# Patient Record
Sex: Female | Born: 1945
Health system: Southern US, Community
[De-identification: ages and names within clinical notes are randomized; demographics above are authoritative.]

## PROBLEM LIST (undated history)

## (undated) DIAGNOSIS — N879 Dysplasia of cervix uteri, unspecified: Secondary | ICD-10-CM

## (undated) DIAGNOSIS — J449 Chronic obstructive pulmonary disease, unspecified: Secondary | ICD-10-CM

## (undated) DIAGNOSIS — F419 Anxiety disorder, unspecified: Secondary | ICD-10-CM

## (undated) DIAGNOSIS — F329 Major depressive disorder, single episode, unspecified: Secondary | ICD-10-CM

## (undated) DIAGNOSIS — J340 Abscess, furuncle and carbuncle of nose: Secondary | ICD-10-CM

## (undated) DIAGNOSIS — F32A Depression, unspecified: Secondary | ICD-10-CM

## (undated) DIAGNOSIS — C44722 Squamous cell carcinoma of skin of right lower limb, including hip: Secondary | ICD-10-CM

## (undated) DIAGNOSIS — C801 Malignant (primary) neoplasm, unspecified: Secondary | ICD-10-CM

## (undated) DIAGNOSIS — J45909 Unspecified asthma, uncomplicated: Secondary | ICD-10-CM

## (undated) DIAGNOSIS — T7840XA Allergy, unspecified, initial encounter: Secondary | ICD-10-CM

## (undated) DIAGNOSIS — M810 Age-related osteoporosis without current pathological fracture: Secondary | ICD-10-CM

## (undated) DIAGNOSIS — C44719 Basal cell carcinoma of skin of left lower limb, including hip: Secondary | ICD-10-CM

## (undated) DIAGNOSIS — K219 Gastro-esophageal reflux disease without esophagitis: Secondary | ICD-10-CM

## (undated) DIAGNOSIS — E7800 Pure hypercholesterolemia, unspecified: Secondary | ICD-10-CM

## (undated) DIAGNOSIS — K52832 Lymphocytic colitis: Secondary | ICD-10-CM

## (undated) DIAGNOSIS — I10 Essential (primary) hypertension: Secondary | ICD-10-CM

## (undated) DIAGNOSIS — H409 Unspecified glaucoma: Secondary | ICD-10-CM

## (undated) HISTORY — DX: Malignant (primary) neoplasm, unspecified: C80.1

## (undated) HISTORY — DX: Allergy, unspecified, initial encounter: T78.40XA

## (undated) HISTORY — DX: Gastro-esophageal reflux disease without esophagitis: K21.9

## (undated) HISTORY — DX: Unspecified glaucoma: H40.9

## (undated) HISTORY — PX: COLPOSCOPY: SHX161

## (undated) HISTORY — DX: Depression, unspecified: F32.A

## (undated) HISTORY — DX: Chronic obstructive pulmonary disease, unspecified: J44.9

## (undated) HISTORY — DX: Basal cell carcinoma of skin of left lower limb, including hip: C44.719

## (undated) HISTORY — DX: Unspecified asthma, uncomplicated: J45.909

## (undated) HISTORY — PX: COSMETIC SURGERY: SHX468

## (undated) HISTORY — PX: OOPHORECTOMY: SHX86

## (undated) HISTORY — DX: Essential (primary) hypertension: I10

## (undated) HISTORY — DX: Anxiety disorder, unspecified: F41.9

## (undated) HISTORY — PX: TONSILLECTOMY: SUR1361

## (undated) HISTORY — DX: Dysplasia of cervix uteri, unspecified: N87.9

## (undated) HISTORY — DX: Major depressive disorder, single episode, unspecified: F32.9

## (undated) HISTORY — DX: Lymphocytic colitis: K52.832

## (undated) HISTORY — DX: Squamous cell carcinoma of skin of right lower limb, including hip: C44.722

## (undated) HISTORY — DX: Pure hypercholesterolemia, unspecified: E78.00

## (undated) HISTORY — PX: AUGMENTATION MAMMAPLASTY: SUR837

## (undated) HISTORY — DX: Age-related osteoporosis without current pathological fracture: M81.0

---

## 1987-10-14 HISTORY — PX: ABDOMINAL HYSTERECTOMY: SHX81

## 1998-08-20 ENCOUNTER — Ambulatory Visit (HOSPITAL_COMMUNITY): Admission: RE | Admit: 1998-08-20 | Discharge: 1998-08-20 | Payer: Self-pay | Admitting: Gynecology

## 1998-08-20 ENCOUNTER — Encounter: Payer: Self-pay | Admitting: Gynecology

## 1999-07-08 ENCOUNTER — Ambulatory Visit (HOSPITAL_COMMUNITY): Admission: RE | Admit: 1999-07-08 | Discharge: 1999-07-08 | Payer: Self-pay | Admitting: *Deleted

## 1999-08-14 ENCOUNTER — Other Ambulatory Visit: Admission: RE | Admit: 1999-08-14 | Discharge: 1999-08-14 | Payer: Self-pay | Admitting: Gynecology

## 1999-10-17 ENCOUNTER — Ambulatory Visit (HOSPITAL_COMMUNITY): Admission: RE | Admit: 1999-10-17 | Discharge: 1999-10-17 | Payer: Self-pay | Admitting: Gynecology

## 1999-10-17 ENCOUNTER — Encounter: Payer: Self-pay | Admitting: Gynecology

## 2000-10-01 ENCOUNTER — Emergency Department (HOSPITAL_COMMUNITY): Admission: EM | Admit: 2000-10-01 | Discharge: 2000-10-01 | Payer: Self-pay | Admitting: *Deleted

## 2000-10-02 ENCOUNTER — Encounter: Payer: Self-pay | Admitting: Emergency Medicine

## 2000-10-13 HISTORY — PX: OTHER SURGICAL HISTORY: SHX169

## 2000-11-09 ENCOUNTER — Other Ambulatory Visit: Admission: RE | Admit: 2000-11-09 | Discharge: 2000-11-09 | Payer: Self-pay | Admitting: Gynecology

## 2001-04-13 ENCOUNTER — Emergency Department (HOSPITAL_COMMUNITY): Admission: EM | Admit: 2001-04-13 | Discharge: 2001-04-14 | Payer: Self-pay | Admitting: Emergency Medicine

## 2001-04-14 ENCOUNTER — Encounter (INDEPENDENT_AMBULATORY_CARE_PROVIDER_SITE_OTHER): Payer: Self-pay | Admitting: *Deleted

## 2001-04-14 ENCOUNTER — Encounter: Payer: Self-pay | Admitting: Emergency Medicine

## 2001-04-23 ENCOUNTER — Encounter (INDEPENDENT_AMBULATORY_CARE_PROVIDER_SITE_OTHER): Payer: Self-pay | Admitting: *Deleted

## 2001-04-23 ENCOUNTER — Ambulatory Visit (HOSPITAL_COMMUNITY): Admission: RE | Admit: 2001-04-23 | Discharge: 2001-04-23 | Payer: Self-pay | Admitting: General Surgery

## 2001-04-23 ENCOUNTER — Encounter (HOSPITAL_BASED_OUTPATIENT_CLINIC_OR_DEPARTMENT_OTHER): Payer: Self-pay | Admitting: General Surgery

## 2001-05-18 ENCOUNTER — Encounter (INDEPENDENT_AMBULATORY_CARE_PROVIDER_SITE_OTHER): Payer: Self-pay

## 2001-05-18 ENCOUNTER — Inpatient Hospital Stay (HOSPITAL_COMMUNITY): Admission: RE | Admit: 2001-05-18 | Discharge: 2001-05-22 | Payer: Self-pay | Admitting: Urology

## 2001-08-10 ENCOUNTER — Encounter: Payer: Self-pay | Admitting: Gynecology

## 2001-08-10 ENCOUNTER — Ambulatory Visit (HOSPITAL_COMMUNITY): Admission: RE | Admit: 2001-08-10 | Discharge: 2001-08-10 | Payer: Self-pay | Admitting: Gynecology

## 2001-11-16 ENCOUNTER — Other Ambulatory Visit: Admission: RE | Admit: 2001-11-16 | Discharge: 2001-11-16 | Payer: Self-pay | Admitting: Gynecology

## 2002-05-20 ENCOUNTER — Encounter: Payer: Self-pay | Admitting: Urology

## 2002-05-20 ENCOUNTER — Encounter: Admission: RE | Admit: 2002-05-20 | Discharge: 2002-05-20 | Payer: Self-pay | Admitting: Urology

## 2002-05-24 ENCOUNTER — Emergency Department (HOSPITAL_COMMUNITY): Admission: EM | Admit: 2002-05-24 | Discharge: 2002-05-25 | Payer: Self-pay | Admitting: Emergency Medicine

## 2002-05-24 ENCOUNTER — Encounter: Payer: Self-pay | Admitting: *Deleted

## 2002-07-22 ENCOUNTER — Ambulatory Visit (HOSPITAL_COMMUNITY): Admission: RE | Admit: 2002-07-22 | Discharge: 2002-07-22 | Payer: Self-pay | Admitting: Internal Medicine

## 2002-08-11 ENCOUNTER — Encounter (INDEPENDENT_AMBULATORY_CARE_PROVIDER_SITE_OTHER): Payer: Self-pay | Admitting: Specialist

## 2002-08-11 ENCOUNTER — Encounter (INDEPENDENT_AMBULATORY_CARE_PROVIDER_SITE_OTHER): Payer: Self-pay | Admitting: *Deleted

## 2002-08-11 ENCOUNTER — Ambulatory Visit (HOSPITAL_COMMUNITY): Admission: RE | Admit: 2002-08-11 | Discharge: 2002-08-11 | Payer: Self-pay | Admitting: Gastroenterology

## 2003-01-31 ENCOUNTER — Emergency Department (HOSPITAL_COMMUNITY): Admission: EM | Admit: 2003-01-31 | Discharge: 2003-01-31 | Payer: Self-pay | Admitting: Emergency Medicine

## 2003-01-31 ENCOUNTER — Encounter: Payer: Self-pay | Admitting: Emergency Medicine

## 2004-12-13 ENCOUNTER — Emergency Department (HOSPITAL_COMMUNITY): Admission: EM | Admit: 2004-12-13 | Discharge: 2004-12-13 | Payer: Self-pay | Admitting: Family Medicine

## 2005-04-21 ENCOUNTER — Ambulatory Visit (HOSPITAL_COMMUNITY): Admission: RE | Admit: 2005-04-21 | Discharge: 2005-04-21 | Payer: Self-pay | Admitting: Urology

## 2005-05-01 ENCOUNTER — Ambulatory Visit (HOSPITAL_COMMUNITY): Admission: RE | Admit: 2005-05-01 | Discharge: 2005-05-01 | Payer: Self-pay | Admitting: Gynecology

## 2005-07-10 ENCOUNTER — Other Ambulatory Visit: Admission: RE | Admit: 2005-07-10 | Discharge: 2005-07-10 | Payer: Self-pay | Admitting: Obstetrics and Gynecology

## 2005-10-24 ENCOUNTER — Ambulatory Visit (HOSPITAL_COMMUNITY): Admission: RE | Admit: 2005-10-24 | Discharge: 2005-10-24 | Payer: Self-pay | Admitting: Internal Medicine

## 2006-02-09 ENCOUNTER — Emergency Department (HOSPITAL_COMMUNITY): Admission: EM | Admit: 2006-02-09 | Discharge: 2006-02-09 | Payer: Self-pay | Admitting: Emergency Medicine

## 2006-06-17 ENCOUNTER — Ambulatory Visit (HOSPITAL_COMMUNITY): Admission: RE | Admit: 2006-06-17 | Discharge: 2006-06-17 | Payer: Self-pay | Admitting: Internal Medicine

## 2006-06-19 ENCOUNTER — Ambulatory Visit: Admission: RE | Admit: 2006-06-19 | Discharge: 2006-06-19 | Payer: Self-pay | Admitting: Internal Medicine

## 2006-07-01 ENCOUNTER — Ambulatory Visit: Payer: Self-pay | Admitting: Internal Medicine

## 2006-07-06 ENCOUNTER — Ambulatory Visit: Payer: Self-pay | Admitting: Cardiology

## 2006-07-14 ENCOUNTER — Ambulatory Visit: Payer: Self-pay | Admitting: Internal Medicine

## 2006-08-21 ENCOUNTER — Ambulatory Visit: Payer: Self-pay | Admitting: Internal Medicine

## 2006-08-28 ENCOUNTER — Ambulatory Visit: Payer: Self-pay | Admitting: Internal Medicine

## 2006-09-28 ENCOUNTER — Ambulatory Visit: Payer: Self-pay | Admitting: Internal Medicine

## 2006-10-21 ENCOUNTER — Other Ambulatory Visit: Admission: RE | Admit: 2006-10-21 | Discharge: 2006-10-21 | Payer: Self-pay | Admitting: Obstetrics and Gynecology

## 2006-10-27 ENCOUNTER — Ambulatory Visit: Payer: Self-pay | Admitting: Internal Medicine

## 2006-10-28 ENCOUNTER — Ambulatory Visit: Payer: Self-pay | Admitting: Cardiology

## 2006-10-30 ENCOUNTER — Ambulatory Visit (HOSPITAL_COMMUNITY): Admission: RE | Admit: 2006-10-30 | Discharge: 2006-10-30 | Payer: Self-pay | Admitting: Internal Medicine

## 2006-11-17 ENCOUNTER — Ambulatory Visit: Payer: Self-pay | Admitting: Internal Medicine

## 2006-12-17 ENCOUNTER — Ambulatory Visit: Payer: Self-pay | Admitting: Internal Medicine

## 2007-02-02 ENCOUNTER — Ambulatory Visit (HOSPITAL_COMMUNITY): Admission: RE | Admit: 2007-02-02 | Discharge: 2007-02-02 | Payer: Self-pay | Admitting: Internal Medicine

## 2007-02-06 ENCOUNTER — Emergency Department (HOSPITAL_COMMUNITY): Admission: EM | Admit: 2007-02-06 | Discharge: 2007-02-06 | Payer: Self-pay | Admitting: Emergency Medicine

## 2007-02-07 ENCOUNTER — Emergency Department (HOSPITAL_COMMUNITY): Admission: EM | Admit: 2007-02-07 | Discharge: 2007-02-07 | Payer: Self-pay | Admitting: Emergency Medicine

## 2007-02-13 ENCOUNTER — Emergency Department (HOSPITAL_COMMUNITY): Admission: EM | Admit: 2007-02-13 | Discharge: 2007-02-13 | Payer: Self-pay | Admitting: Emergency Medicine

## 2007-02-23 ENCOUNTER — Ambulatory Visit: Payer: Self-pay | Admitting: Internal Medicine

## 2007-03-03 ENCOUNTER — Ambulatory Visit: Payer: Self-pay | Admitting: Internal Medicine

## 2007-03-29 ENCOUNTER — Ambulatory Visit: Payer: Self-pay | Admitting: Internal Medicine

## 2007-04-15 ENCOUNTER — Ambulatory Visit: Payer: Self-pay | Admitting: Internal Medicine

## 2007-04-23 ENCOUNTER — Ambulatory Visit: Payer: Self-pay | Admitting: Internal Medicine

## 2007-07-20 ENCOUNTER — Emergency Department (HOSPITAL_COMMUNITY): Admission: EM | Admit: 2007-07-20 | Discharge: 2007-07-20 | Payer: Self-pay | Admitting: Emergency Medicine

## 2007-08-26 ENCOUNTER — Ambulatory Visit (HOSPITAL_COMMUNITY): Admission: RE | Admit: 2007-08-26 | Discharge: 2007-08-26 | Payer: Self-pay | Admitting: Internal Medicine

## 2007-09-02 ENCOUNTER — Ambulatory Visit: Payer: Self-pay | Admitting: Internal Medicine

## 2007-09-14 ENCOUNTER — Telehealth (INDEPENDENT_AMBULATORY_CARE_PROVIDER_SITE_OTHER): Payer: Self-pay | Admitting: *Deleted

## 2007-11-05 ENCOUNTER — Telehealth (INDEPENDENT_AMBULATORY_CARE_PROVIDER_SITE_OTHER): Payer: Self-pay | Admitting: *Deleted

## 2007-11-30 ENCOUNTER — Ambulatory Visit: Payer: Self-pay | Admitting: Internal Medicine

## 2007-11-30 DIAGNOSIS — E039 Hypothyroidism, unspecified: Secondary | ICD-10-CM | POA: Insufficient documentation

## 2007-12-03 ENCOUNTER — Emergency Department (HOSPITAL_COMMUNITY): Admission: EM | Admit: 2007-12-03 | Discharge: 2007-12-03 | Payer: Self-pay | Admitting: Emergency Medicine

## 2007-12-10 ENCOUNTER — Ambulatory Visit: Payer: Self-pay | Admitting: Gastroenterology

## 2007-12-29 ENCOUNTER — Ambulatory Visit: Payer: Self-pay | Admitting: Internal Medicine

## 2007-12-30 ENCOUNTER — Ambulatory Visit: Payer: Self-pay | Admitting: Internal Medicine

## 2008-03-07 ENCOUNTER — Telehealth: Payer: Self-pay | Admitting: Internal Medicine

## 2008-03-08 ENCOUNTER — Ambulatory Visit: Payer: Self-pay | Admitting: Internal Medicine

## 2008-03-09 ENCOUNTER — Encounter: Payer: Self-pay | Admitting: Internal Medicine

## 2008-03-09 ENCOUNTER — Ambulatory Visit (HOSPITAL_COMMUNITY): Admission: RE | Admit: 2008-03-09 | Discharge: 2008-03-09 | Payer: Self-pay | Admitting: Internal Medicine

## 2008-03-09 ENCOUNTER — Ambulatory Visit: Payer: Self-pay | Admitting: Internal Medicine

## 2008-03-09 LAB — CONVERTED CEMR LAB
BUN: 14 mg/dL (ref 6–23)
Basophils Absolute: 0.1 10*3/uL (ref 0.0–0.1)
CO2: 27 meq/L (ref 19–32)
Chloride: 104 meq/L (ref 96–112)
Creatinine, Ser: 1.2 mg/dL (ref 0.4–1.2)
Eosinophils Relative: 12.3 % — ABNORMAL HIGH (ref 0.0–5.0)
GFR calc non Af Amer: 49 mL/min
Glucose, Bld: 92 mg/dL (ref 70–99)
HCT: 40.6 % (ref 36.0–46.0)
Hemoglobin: 13.4 g/dL (ref 12.0–15.0)
Lymphocytes Relative: 26.9 % (ref 12.0–46.0)
Monocytes Absolute: 0.7 10*3/uL (ref 0.1–1.0)
Monocytes Relative: 9.5 % (ref 3.0–12.0)
Neutro Abs: 3.4 10*3/uL (ref 1.4–7.7)
Neutrophils Relative %: 50.5 % (ref 43.0–77.0)
Potassium: 4.1 meq/L (ref 3.5–5.1)
RDW: 12.8 % (ref 11.5–14.6)
Sodium: 140 meq/L (ref 135–145)

## 2008-03-10 ENCOUNTER — Telehealth: Payer: Self-pay | Admitting: Internal Medicine

## 2008-03-10 ENCOUNTER — Ambulatory Visit: Payer: Self-pay | Admitting: Internal Medicine

## 2008-03-14 ENCOUNTER — Encounter: Payer: Self-pay | Admitting: Internal Medicine

## 2008-03-15 ENCOUNTER — Encounter: Payer: Self-pay | Admitting: Internal Medicine

## 2008-03-16 ENCOUNTER — Encounter: Payer: Self-pay | Admitting: Internal Medicine

## 2008-03-21 ENCOUNTER — Ambulatory Visit: Payer: Self-pay | Admitting: Internal Medicine

## 2008-03-29 ENCOUNTER — Ambulatory Visit: Payer: Self-pay | Admitting: Internal Medicine

## 2008-05-19 ENCOUNTER — Telehealth: Payer: Self-pay | Admitting: Internal Medicine

## 2008-06-13 ENCOUNTER — Ambulatory Visit: Payer: Self-pay | Admitting: Internal Medicine

## 2008-08-30 ENCOUNTER — Other Ambulatory Visit: Admission: RE | Admit: 2008-08-30 | Discharge: 2008-08-30 | Payer: Self-pay | Admitting: Obstetrics and Gynecology

## 2008-08-30 ENCOUNTER — Encounter: Payer: Self-pay | Admitting: Obstetrics and Gynecology

## 2008-08-30 ENCOUNTER — Ambulatory Visit: Payer: Self-pay | Admitting: Obstetrics and Gynecology

## 2008-11-27 ENCOUNTER — Telehealth (INDEPENDENT_AMBULATORY_CARE_PROVIDER_SITE_OTHER): Payer: Self-pay | Admitting: *Deleted

## 2009-07-27 ENCOUNTER — Encounter: Payer: Self-pay | Admitting: Cardiology

## 2009-08-13 ENCOUNTER — Ambulatory Visit: Payer: Self-pay | Admitting: Internal Medicine

## 2009-08-31 ENCOUNTER — Ambulatory Visit: Payer: Self-pay | Admitting: Cardiology

## 2009-09-11 ENCOUNTER — Ambulatory Visit: Payer: Self-pay | Admitting: Internal Medicine

## 2009-09-12 ENCOUNTER — Telehealth: Payer: Self-pay | Admitting: Internal Medicine

## 2009-09-13 ENCOUNTER — Telehealth (INDEPENDENT_AMBULATORY_CARE_PROVIDER_SITE_OTHER): Payer: Self-pay | Admitting: *Deleted

## 2009-09-17 ENCOUNTER — Encounter (HOSPITAL_COMMUNITY): Admission: RE | Admit: 2009-09-17 | Discharge: 2009-10-12 | Payer: Self-pay | Admitting: Cardiology

## 2009-09-17 ENCOUNTER — Ambulatory Visit: Payer: Self-pay | Admitting: Cardiology

## 2009-09-17 ENCOUNTER — Ambulatory Visit: Payer: Self-pay

## 2009-09-24 ENCOUNTER — Telehealth: Payer: Self-pay | Admitting: Cardiology

## 2010-02-18 ENCOUNTER — Other Ambulatory Visit: Admission: RE | Admit: 2010-02-18 | Discharge: 2010-02-18 | Payer: Self-pay | Admitting: Obstetrics and Gynecology

## 2010-02-18 ENCOUNTER — Ambulatory Visit: Payer: Self-pay | Admitting: Obstetrics and Gynecology

## 2010-03-04 ENCOUNTER — Ambulatory Visit (HOSPITAL_COMMUNITY): Admission: RE | Admit: 2010-03-04 | Discharge: 2010-03-04 | Payer: Self-pay | Admitting: Internal Medicine

## 2010-03-08 ENCOUNTER — Encounter: Admission: RE | Admit: 2010-03-08 | Discharge: 2010-03-08 | Payer: Self-pay | Admitting: Internal Medicine

## 2010-04-22 ENCOUNTER — Ambulatory Visit: Payer: Self-pay | Admitting: Internal Medicine

## 2010-05-01 ENCOUNTER — Ambulatory Visit: Payer: Self-pay | Admitting: Internal Medicine

## 2010-10-31 ENCOUNTER — Telehealth: Payer: Self-pay | Admitting: Internal Medicine

## 2010-11-11 ENCOUNTER — Encounter
Admission: RE | Admit: 2010-11-11 | Discharge: 2010-11-11 | Payer: Self-pay | Source: Home / Self Care | Attending: Gastroenterology | Admitting: Gastroenterology

## 2010-11-14 NOTE — Assessment & Plan Note (Signed)
Summary: dysphagia...as.    History of Present Illness Visit Type: Follow-up Visit Primary GI MD: Yancey Flemings MD Primary Provider: Marisue Brooklyn, DO Requesting Provider: n/a Chief Complaint: Patient complains of solid food dysphagia that started about a month ago. She has a personal hx of espohgeal stricture.  History of Present Illness:   65 year old female with hypertension, hypothyroidism, renal cell carcinoma status post left nephrectomy, asthma/COPD, gastroesophageal reflux disease comfortably peptic stricture, and lymphocytic colitis. She does also have multiple positional surgeries including appendectomy, cholecystectomy, hysterectomy, thyroidectomy, and breast augmentation. She presents today with chief complaint of recurrent solid food dysphagia of about one month's duration. She last underwent upper endoscopy for the same symptoms in November of 2010. At that time she was found to have a focal peptic stricture of the distal esophagus which was dilated to 54 [place]. This help her dysphagia. We recommended chronic PPI use to reduce the risk of stricture ree formation. She states that she is on no acid suppressant medication because she has no heartburn. Again explained to her the rationale behind acid suppressive therapy and patient's post dilation for ringlike strictures of the distal esophagus due to GERD. She also complains of problems with raspy voice. No lower GI complaints. Last complete colonoscopy in 2003.   GI Review of Systems    Reports abdominal pain and  dysphagia with solids.     Location of  Abdominal pain: epigastric area.    Denies acid reflux, belching, bloating, chest pain, dysphagia with liquids, heartburn, loss of appetite, nausea, vomiting, vomiting blood, weight loss, and  weight gain.        Denies anal fissure, black tarry stools, change in bowel habit, constipation, diarrhea, diverticulosis, fecal incontinence, heme positive stool, hemorrhoids, irritable bowel  syndrome, jaundice, light color stool, liver problems, rectal bleeding, and  rectal pain.    Current Medications (verified): 1)  Cymbalta 20 Mg  Cpep (Duloxetine Hcl) .... 2 Tabs Once Daily 2)  Diovan 160 Mg  Tabs (Valsartan) .... Take 1 Tablet By Mouth Once A Day 3)  Levothyroxine Sodium 50 Mcg  Tabs (Levothyroxine Sodium) .... Take 1 Tablet By Mouth Once A Day 4)  Klonopin 0.5 Mg Tabs (Clonazepam) .... Take One By Mouth At Bedtime 5)  Vitamin D3 1000 Unit  Tabs (Cholecalciferol) .... Take 5 By Mouth Once Daily 6)  Multivitamins   Tabs (Multiple Vitamin) .... Take 1 By Mouth Once Daily 7)  Symbicort 160-4.5 Mcg/act Aero (Budesonide-Formoterol Fumarate) .... Twice in The Morning and At Night 8)  Vitamin B-12 1000 Mcg Tabs (Cyanocobalamin) .... One Tablet By Mouth Once Daily  Allergies (verified): 1)  Penicillin  Past History:  Past Medical History: Reviewed history from 08/31/2009 and no changes required. 1. Asthma 2. allergic rhinitis 3. GERD-with esophageal stricture, esophageal dilatation 3/09 4. COPD 5. Hypothyroidism 6. Hypertension 7. OSA, will be set up for CPAP 8. Renal cell carcinoma s/p left nephrectomy 9. Lymphocytic colitis 10. sciatica  Past Surgical History: Reviewed history from 03/29/2008 and no changes required. left renal cell cancer- nephrectomy 2002 Appendectomy Cholecystectomy Hysterectomy Thyroidectomy Breast augmentation endoscopy 2009  Family History: Reviewed history from 08/31/2009 and no changes required. Asthma allergic rhinitis Father smoked- died of lung cancer Coronary Heart Disease colon cancer/breast cancer Family History of Colon Cancer: uncle Family History of Breast Cancer: 2 aunts Mother with atrial fibrillation No premature CAD  Social History: Reviewed history from 08/31/2009 and no changes required. 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  Alcohol Use - yes social Illicit Drug  Use - no Divorced.  Single but engaged to be married Daily Caffeine Use 3 cups coffee per day  Review of Systems  The patient denies allergy/sinus, anemia, anxiety-new, arthritis/joint pain, back pain, blood in urine, breast changes/lumps, change in vision, confusion, cough, coughing up blood, depression-new, fainting, fatigue, fever, headaches-new, hearing problems, heart murmur, heart rhythm changes, itching, menstrual pain, muscle pains/cramps, night sweats, nosebleeds, pregnancy symptoms, shortness of breath, skin rash, sleeping problems, sore throat, swelling of feet/legs, swollen lymph glands, thirst - excessive , urination - excessive , urination changes/pain, urine leakage, vision changes, and voice change.    Vital Signs:  Patient profile:   65 year old female Height:      65.5 inches Weight:      158.8 pounds BMI:     26.12 Pulse rate:   72 / minute Pulse rhythm:   regular BP sitting:   140 / 80  (right arm) Cuff size:   regular  Vitals Entered By: Harlow Mares CMA Duncan Dull) (April 22, 2010 11:49 AM)  Physical Exam  General:  Well developed, well nourished, no acute distress. Head:  Normocephalic and atraumatic. Eyes:  PERRLA, no icterus. Mouth:  No deformity or lesions, dentition normal. Lungs:  Clear throughout to auscultation. Heart:  Regular rate and rhythm; no murmurs, rubs,  or bruits. Abdomen:  Soft, nontender and nondistended. No masses, hepatosplenomegaly or hernias noted. Normal bowel sounds. Msk:  Symmetrical with no gross deformities. Normal posture. Pulses:  Normal pulses noted. Extremities:  no edema Neurologic:  alert and oriented Skin:  Intact without significant lesions or rashes. Psych:  Alert and cooperative. Normal mood and affect.   Impression & Recommendations:  Problem # 1:  DYSPHAGIA (ICD-787.29) one month history of recurrent solid food dysphagia likely secondary to recurrent peptic stricture of the distal esophagus  Plan: #1. Resume daily  PPI. Nexium samples given. The patient will check her insurance formulary regarding the most cost effective therapy #2. Upper endoscopy with esophageal dilation. The nature of the procedure as well as the risks, benefits, and alternatives were again reviewed. She understood and agreed to proceed.  Problem # 2:  GERD (ICD-530.81) ongoing.. May be responsible for her complaints a raspy voice  Plan: #1. Reflux precautions #2. B.i.d. H2 receptor antagonist or daily PPI  Problem # 3:  SCREENING COLORECTAL-CANCER (ICD-V76.51) due for followup colonoscopy around 2013  Problem # 4:  COLLAGENOUS COLITIS (ICD-710.9) no recurrence since being treated with a short course of steroids in May of 2009  Other Orders: EGD SAV (EGD SAV)  Patient Instructions: 1)  EGD with Dilt.  [facility] 05/01/10 11:30 am arrive at 10:30 am 2)  Upper Endoscopy brochure given.  3)  Nexium Samples given to patient.   4)  The medication list was reviewed and reconciled.  All changed / newly prescribed medications were explained.  A complete medication list was provided to the patient / caregiver.

## 2010-11-14 NOTE — Letter (Signed)
Summary: EGD Instructions  [facility]  [address] [place], [place] [address]   Phone: [phone]  Fax: [phone]       DOROTHEE NAPIERKOWSKI    14-Dec-1945    MRN: [id number]       Procedure Day /Date:WEDNESDAY, 05/01/10     Arrival Time: 10:30 AM       Procedure Time:11:30 AM     Location of Procedure:                    [facility] (4th Floor)   PREPARATION FOR ENDOSCOPY WITH DILT.   On WEDNESDAY, 7/20/11THE DAY OF THE PROCEDURE:  1.   No solid foods, milk or milk products are allowed after midnight the night before your procedure.  2.   Do not drink anything colored red or purple.  Avoid juices with pulp.  No orange juice.  3.  You may drink clear liquids until 9:30 AM, which is 2 hours before your procedure.                                                                                                CLEAR LIQUIDS INCLUDE: Water Jello Ice Popsicles Tea (sugar ok, no milk/cream) Powdered fruit flavored drinks Coffee (sugar ok, no milk/cream) Gatorade Juice: apple, white grape, white cranberry  Lemonade Clear bullion, consomm, broth Carbonated beverages (any kind) Strained chicken noodle soup Hard Candy   MEDICATION INSTRUCTIONS  Unless otherwise instructed, you should take regular prescription medications with a small sip of water as early as possible the morning of your procedure.             OTHER INSTRUCTIONS  You will need a responsible adult at least 64 years of age to accompany you and drive you home.   This person must remain in the waiting room during your procedure.  Wear loose fitting clothing that is easily removed.  Leave jewelry and other valuables at home.  However, you may wish to bring a book to read or an iPod/MP3 player to listen to music as you wait for your procedure to start.  Remove all body piercing jewelry and leave at home.  Total time from sign-in until discharge is approximately 2-3  hours.  You should go home directly after your procedure and rest.  You can resume normal activities the day after your procedure.  The day of your procedure you should not:   Drive   Make legal decisions   Operate machinery   Drink alcohol   Return to work  You will receive specific instructions about eating, activities and medications before you leave.    The above instructions have been reviewed and explained to me by   _______________________    I fully understand and can verbalize these instructions _____________________________ Date _________

## 2010-11-14 NOTE — Procedures (Signed)
Summary: Upper Endoscopy  Patient: [name] [name] Note: All result statuses are Final unless otherwise noted.  Tests: (1) Upper Endoscopy (EGD)   EGD Upper Endoscopy       DONE     [facility]     [address]     [place], [place]  [address]           ENDOSCOPY PROCEDURE REPORT           PATIENT:  [name] [name], [name] [name]  MR#:  [id number]     BIRTHDATE:  07/23/1946, 64 yrs. old  GENDER:  female           ENDOSCOPIST:  Wilhemina Bonito. Eda Keys, MD     Referred by:  Office           PROCEDURE DATE:  05/01/2010     PROCEDURE:  EGD, diagnostic,     Maloney Dilation of Esophagus -     38F     ASA CLASS:  Class II     INDICATIONS:  dysphagia, dilation of esophageal stricture           MEDICATIONS:   Fentanyl 100 mcg IV, Versed 10 mg IV, Benadryl 50     mg IV     TOPICAL ANESTHETIC:  Exactacain Spray           DESCRIPTION OF PROCEDURE:   After the risks benefits and     alternatives of the procedure were thoroughly explained, informed     consent was obtained.  The [id number]-H180 [id number] endoscope was     introduced through the mouth and advanced to the second portion of     the duodenum, without limitations.  The instrument was slowly     withdrawn as the mucosa was fully examined.     <<PROCEDUREIMAGES>>           Concentric large caliber esophageal rings were found in the     proximal and mid esophagus as well as more focal larger caliber     distal stricture.  The stomach was entered and closely examined.     The antrum, angularis, and lesser curvature were well visualized,     including a retroflexed view of the cardia and fundus. The stomach     wall was normally distensable. The scope passed easily through the     pylorus into the duodenum.  The duodenal bulb was normal in     appearance, as was the postbulbar duodenum.    Retroflexed views     revealed no abnormalities.    The scope was then withdrawn from     the patient and the procedure completed.           THERAPY; 38F  MALONEY DILATION W/O RESISTANCE OR HEME. TOLERATED     WELL           COMPLICATIONS:  None           ENDOSCOPIC IMPRESSION:     1) Stricture and Rings or the esophagus - s/p 38F Maloney     dilation     2) Normal stomach     3) Normal duodenum           RECOMMENDATIONS:     1) Clear liquids until 2 pm, then soft foods rest of day. Resume     prior diet tomorrow.     2) Continue Nexium daily           ______________________________     Wilhemina Bonito.  Eda Keys, MD           CC:  The Patient           n.     eSIGNED:   Wilhemina Bonito. Eda Keys at 05/01/2010 12:40 PM           Elenor Quinones, [id number]  Note: An exclamation mark (!) indicates a result that was not dispersed into the flowsheet. Document Creation Date: 05/01/2010 12:40 PM _______________________________________________________________________  (1) Order result status: Final Collection or observation date-time: 05/01/2010 12:26 Requested date-time:  Receipt date-time:  Reported date-time:  Referring Physician:   Ordering Physician: Fransico Setters [phone]) Specimen Source:  Source: Launa Grill Order Number: [id number] Lab site:

## 2010-11-14 NOTE — Progress Notes (Signed)
Summary: Triage   Phone Note Call from Patient Call back at Work Phone [phone]   Caller: Patient Call For: Dr. Marina Goodell Reason for Call: Talk to Nurse Summary of Call: Pt is having trouble with her voice she gets really horce and she has been to her primary and allergist, primary doctor thinks she needs egd Initial call taken by: [place] Johnson,  October 31, 2010 9:17 AM  Follow-up for Phone Call        I have left a message for the patient asking her to call back to schedule a REV with Dr Marina Goodell.  patient just had an EGD in July 2011 Follow-up by: Darcey Nora RN, CGRN,  October 31, 2010 9:39 AM

## 2010-11-29 ENCOUNTER — Telehealth (INDEPENDENT_AMBULATORY_CARE_PROVIDER_SITE_OTHER): Payer: Self-pay | Admitting: *Deleted

## 2010-12-04 NOTE — Progress Notes (Signed)
  Phone Note Other Incoming   Request: Send information Summary of Call: Received another release requesting Colon and Path from 2009 be sent to Dr. Loreta Ave.  No Colon to send. Sent Flex and Path from 03/09/2008 and EGD from 12/30/2007...Marland Kitchenno path available.

## 2011-02-08 ENCOUNTER — Emergency Department (HOSPITAL_COMMUNITY)
Admission: EM | Admit: 2011-02-08 | Discharge: 2011-02-08 | Disposition: A | Discharge status: Home / Self Care | Payer: BC Managed Care – PPO | Attending: Emergency Medicine | Admitting: Emergency Medicine

## 2011-02-08 DIAGNOSIS — R112 Nausea with vomiting, unspecified: Secondary | ICD-10-CM | POA: Insufficient documentation

## 2011-02-08 DIAGNOSIS — R109 Unspecified abdominal pain: Secondary | ICD-10-CM | POA: Insufficient documentation

## 2011-02-08 DIAGNOSIS — R197 Diarrhea, unspecified: Secondary | ICD-10-CM | POA: Insufficient documentation

## 2011-02-08 DIAGNOSIS — E039 Hypothyroidism, unspecified: Secondary | ICD-10-CM | POA: Insufficient documentation

## 2011-02-08 DIAGNOSIS — I10 Essential (primary) hypertension: Secondary | ICD-10-CM | POA: Insufficient documentation

## 2011-02-08 DIAGNOSIS — Z85528 Personal history of other malignant neoplasm of kidney: Secondary | ICD-10-CM | POA: Insufficient documentation

## 2011-02-08 LAB — DIFFERENTIAL
Basophils Absolute: 0 10*3/uL (ref 0.0–0.1)
Lymphocytes Relative: 31 % (ref 12–46)
Lymphs Abs: 1.9 10*3/uL (ref 0.7–4.0)
Neutrophils Relative %: 42 % — ABNORMAL LOW (ref 43–77)

## 2011-02-08 LAB — POCT I-STAT, CHEM 8
Chloride: 106 mEq/L (ref 96–112)
HCT: 39 % (ref 36.0–46.0)
Hemoglobin: 13.3 g/dL (ref 12.0–15.0)
Potassium: 3.9 mEq/L (ref 3.5–5.1)
Sodium: 140 mEq/L (ref 135–145)

## 2011-02-08 LAB — URINALYSIS, ROUTINE W REFLEX MICROSCOPIC
Bilirubin Urine: NEGATIVE
Glucose, UA: NEGATIVE mg/dL
Hgb urine dipstick: NEGATIVE
Specific Gravity, Urine: 1.025 (ref 1.005–1.030)
pH: 6 (ref 5.0–8.0)

## 2011-02-08 LAB — CBC
HCT: 39.1 % (ref 36.0–46.0)
MCV: 89.1 fL (ref 78.0–100.0)
Platelets: 168 10*3/uL (ref 150–400)
RBC: 4.39 MIL/uL (ref 3.87–5.11)
WBC: 6.3 10*3/uL (ref 4.0–10.5)

## 2011-02-08 LAB — URINE MICROSCOPIC-ADD ON

## 2011-02-25 NOTE — Assessment & Plan Note (Signed)
[facility]                         GASTROENTEROLOGY OFFICE NOTE   NAME:[name] [name], [name] [name]                        MRN:          [id number]  DATE:12/29/2007                            DOB:          1946-09-24    HISTORY:  [name] [name] presents today with the chief complaint of  persistent diarrhea.  She is a 65 year old white female with a history  of chronic asthma, steroid-dependent COPD, renal cell carcinoma, status  post left nephrectomy, sleep apnea, and anxiety with depression.  She  also has a history of reflux disease and peptic stricture, for which she  underwent upper endoscopy with esophageal dilation in May of 2008.  I  last saw the patient in the office in July of 2008, following her  endoscopy.  At that time, she was asymptomatic in terms of classic  reflux symptoms.  Her dysphagia resolved.  She continued with problems  with asthmatic bronchitis and reactive airway disease.  It has not been  clear to me whether reflux is a contributor.  She was continued on  Protonix 40 mg b.i.d. and asked to follow up in this office as needed.  She did see our P.A., Mike Gip, December 10, 2007, as a work-in to  evaluate chest discomfort and reflux.  The patient feels that reflux  could explain all of her symptoms.  Basically, she was having worsening  breathing difficulties in the weeks prior to her office visit.  She had  been treated with steroids, as well as antibiotics, including Levaquin.  She also had a urinary tract infection and was treated with Cipro.  On  December 09, 2007, she reports developing diarrhea.  She has had  multiple stools daily, minimal cramping.  She was initially treated with  fluids and yogurt.  This did not help.  Cipro and Activa did not help.  She was scheduled for upper endoscopy tomorrow with possible esophageal  dilation for some recurrent dysphagia.  Dr. Hardie Pulley office called  and wondered if she should undergo  concurrent colonoscopy, due to  diarrhea.  However, I thought it would be best if she came in for an  office visit.  Patient denies a prior history of significant problems  with diarrhea.  She did undergo complete colonoscopy in October of 2003  for the purposes of screening.  There is a family history of colon and  breast cancer.  Her colonoscopy was entirely normal, except for a  prominent appendiceal orifice.   CURRENT MEDICATIONS:  1. Cymbalta 20 mg b.i.d.  2. Diovan 160 mg daily.  3. Levothyroxine 50 mcg daily.  4. Vitamin D.  5. Flax seed.  6. Multivitamin.  7. Nexium 40 mg daily (changed from Protonix empirically).   PHYSICAL EXAM:  Finds a well-appearing female, in no acute distress.  Blood pressure is 181/88, heart rate is 88, weight is 158.2 pounds.  HEENT:  Sclerae anicteric, conjunctivae are pink.  Oral mucosa is  intact.  LUNGS:  Clear.  HEART:  Regular.  ABDOMEN:  Soft, without tenderness, mass or hernia.  Good bowel sounds  heard.   LABORATORY  DATA:  Recent blood work, obtained December 28, 2007, was  unremarkable, including a normal basic metabolic panel, CBC, and stool  studies, including Clostridium difficile.   IMPRESSION:  1. Gastroesophageal reflux disease, complicated by peptic stricture.      No classic reflux symptoms on proton pump inhibitor.  She is having      some mild recurrent dysphagia.  Esophageal dilation did help in the      past.  2. Reactive airway disease, ongoing.  3. Problems with diarrhea, likely antibiotic-associated.  Possibly      toxin-negative Clostridium difficile.   RECOMMENDATIONS:  1. Metronidazole 250 mg p.o. q.i.d. times ten days.  2. Probiotic Align one daily for two weeks.  3. Keep plans for upper endoscopy with esophageal dilation tomorrow      afternoon.  4. If diarrhea persists, consider diagnostic colonoscopy, otherwise      plans for elective colonoscopy after diarrheal illness resolved.  5. Continue proton pump  inhibitor therapy.  Type of proton pump      inhibitor not critical.  6. Ongoing general medical care with Dr. Elisabeth Most.     Wilhemina Bonito. Marina Goodell, MD  Electronically Signed    JNP/MedQ  DD: 12/29/2007  DT: 12/29/2007  Job #: [id number]   cc:   Lovenia Kim, D.O.  Clinton D. Maple Hudson, MD, FCCP, FACP

## 2011-02-25 NOTE — Assessment & Plan Note (Signed)
[facility]                             PULMONARY OFFICE NOTE   NAME:[name] [name], [name] [name]                        MRN:          [id number]  DATE:03/29/2007                            DOB:          07-07-1946    HISTORY OF PRESENT ILLNESS:  The patient is a 65 year old white female  patient of Dr. Roxy Cedar who has a known history of COPD with an asthmatic  component, who presents today for an acute office visit.  The patient  complains that, over the last 2 weeks that she has had worsening dry  cough, wheezing, and shortness of breath.  The patient complains that  her symptoms have worsened over the last week and she has recently  finished a Z-Pak without any improvement in symptoms.  The patient also  complains she has had a significant amount of reflux and has recently  been evaluated by Dr. Marina Goodell and underwent an endoscopy on Mar 03, 2007,  which showed an esophageal stricture, now status post dilatation, and  esophagitis.  The patient is currently on Protonix 40 mg daily.  The  patient complains that she still continues to get breakthrough reflux  symptoms, especially after eating.  The patient denies any purulent  sputum, fever, hemoptysis, orthopnea, PND, or leg swelling.   PAST MEDICAL HISTORY:  Reviewed.   CURRENT MEDICATIONS:  Reviewed.   PHYSICAL EXAM:  The patient is a pleasant female in no acute distress.  She is afebrile with stable vital signs.  O2 saturation is 96% on room  air.  HEENT:  Unremarkable.  NECK:  Supple without cervical adenopathy.  No JVD.  LUNGS:  Reveal coarse breath sounds bilaterally with a few expiratory  wheezes.  The patient does have some upper airway pseudo-wheezing.  CARDIAC:  Regular rate and rhythm.  ABDOMEN:  Soft and nontender.  EXTREMITIES:  Warm without any edema.   IMPRESSION/RECOMMENDATIONS:  Asthmatic bronchitic exacerbation with  upper airway instability.  The patient is currently on Theophylline,  which  could be exacerbating her reflux.  I have recommended that she  hold Theophylline for now, increase Protonix up to 40 mg twice daily,  along with reflux preventative measures.  She was given a Xopenex  nebulizer treatment in the office.  Finish a prednisone taper over the  next week. The patient is also recommended to  use Mucinex DM for cough suppression.  The patient will return here in 1  week with Dr. Maple Hudson or sooner if needed.      Rubye Oaks, NP  Electronically Signed      Clinton D. Maple Hudson, MD, Tonny Bollman, FACP  Electronically Signed   TP/MedQ  DD: 03/30/2007  DT: 03/30/2007  Job #: [id number]

## 2011-02-25 NOTE — Assessment & Plan Note (Signed)
[facility]                             PULMONARY OFFICE NOTE   NAME:[name], [name]                        MRN:          [id number]  DATE:04/15/2007                            DOB:          09-06-1946    PROBLEM LIST:  1. Asthma with chronic obstructive pulmonary disease.  2. History of left renal cancer/nephrectomy.  3. Dysphagia.   HISTORY:  She continues working and says she feels the best she has in a  long time.  She had endoscopy by Dr. Marina Goodell, is off theophylline and  maintaining on predinsone 5 mg b.i.d.  She is more active, has more  energy, trying Xopenex HFA p.r.n.   MEDICATIONS:  1. Protonix 40 mg b.i.d.  2. Cymbalta 20 mg x2.  3. Diovan 160 mg.  4. Levothyroxine 50 mcg.  5. Alprazolam 0.5 mg.  6. Vitamins.  7. Spiriva.  8. Prednisone 5 mg b.i.d.  9. Brovana nebulizer solution b.i.d.  p.r.n.  10.Xopenex HFA inhaler.   DRUG INTOLERANT:  PENICILLIN.   OBJECTIVE:  VITAL SIGNS:  Weight 162 pounds, blood pressure 126/82,  pulse 72, room air saturation 98%.  LUNGS:  Breath sounds are diminished, there is wheeze at end expiration,  otherwise she sounds clear. Work of breathing is not increased, there is  no stridor, no neck vein distention.  CARDIOVASCULAR:  Heart sounds are normal. No edema, adenopathy or  clubbing.   IMPRESSION:  Asthma/COPD subjectively improved.   I think Dr. Lamar Sprinkles assessment that she has dysphagia and probable  reflux is likely correct and that therapy is helping. Maintenance  prednisone also has subjectively made her feel better but we have  discussed long term steroid therapy again.  She is taking calcium with  vitamin D and may need to be on additional medication to protect her  bones.   PLAN:  We have refilled her Xopenex HFA and she is going to try reducing  prednisone to 5 mg daily.  Scheduled to return in 3 months, earlier  p.r.n.     [name] D. Maple Hudson, MD, Tonny Bollman, FACP  Electronically  Signed    CDY/MedQ  DD: 04/17/2007  DT: 04/17/2007  Job #: [id number]   cc:   Lovenia Kim, D.O.

## 2011-02-25 NOTE — Assessment & Plan Note (Signed)
[facility]                             PULMONARY OFFICE NOTE   NAME:[name] [name], [name] [name]                        MRN:          [id number]  DATE:09/02/2007                            DOB:          1946-06-04    PROBLEM LIST:  1. Asthma with chronic obstructive pulmonary disease.  2. History of left renal cancer/nephrectomy.  3. Dysphagia with esophageal reflux/dilatation.   HISTORY:  She has done very well through the Summer but finds that she  cannot come off of steroids.  She can work comfortably on 10 mg a day  and she feels she is much better with less need for Xopenex which has  not been needed at all recently.  She is pending a sleep study based on  a low overnight oximetry, ordered by Dr. Elisabeth Most.  We discussed that  briefly and can look at the results with her once the study is done.  Less hoarseness.  We discussed steroids again carefully.  She was seen  for her reflux and stricture dilatation by Dr. Yancey Flemings for GI.  She  has had flu vaccine, had a second Pneumococcal vaccine in 2007.   MEDICATIONS:  1. Protonix 40 mg b.i.d.  2. Cymbalta 20 mg x2.  3. Diovan 160 mg.  4. Levothyroxine 50 mcg.  5. Vitamins.  6. Prednisone 5 mg x2.  7. Xopenex HFA rescue inhaler.  8. Alprazolam 0.5 mg at h.s. p.r.n.   DRUG INTOLERANT:  PENICILLIN.   OBJECTIVE:  VITAL SIGNS:  Weight 166 pounds, BP 130/78, pulse 69, room  air saturation 99%.  GENERAL:  She looks very comfortable.  CHEST:  Completely clear today which is quite a change.  Breathing is  unlabored.  HEART:  Sounds are regular without murmur or gallop.  NECK:  I do not find neck vein distention, stridor, adenopathy.  EXTREMITIES:  No clubbing, cyanosis, or edema.   IMPRESSION:  Steroid-dependent asthma/chronic obstructive pulmonary  disease.  Her FEV1 was 44% of predicted after bronchodilator in 2007.   PLAN:  I have encouraged her to reduce her prednisone dose even by a  half a pill every  few days as tolerated.  Dr. Elisabeth Most is working on  her calcium and bone density issues and has  ordered a sleep study.  I do not have other recommendations for now and  will be happy to see her in 6 months, earlier p.r.n.     Clinton D. Maple Hudson, MD, Tonny Bollman, FACP  Electronically Signed    CDY/MedQ  DD: 09/04/2007  DT: 09/05/2007  Job #: [id number]   cc:   Lovenia Kim, D.O.

## 2011-02-25 NOTE — Assessment & Plan Note (Signed)
[facility]                         GASTROENTEROLOGY OFFICE NOTE   NAME:WILSON, JEFF MCCALLUM                        MRN:          [id number]  DATE:02/23/2007                            DOB:          28-Sep-1946    OFFICE CONSULTATION NOTE   REASON FOR CONSULTATION:  Chest and back pain, question reflux.   HISTORY:  This is a 65 year old white female with a history of chronic  asthma and steroid-dependent chronic obstructive pulmonary disease for  which she is followed by Dr. Fannie Knee, history of renal cell  carcinoma status post left nephrectomy, sleep apnea, and anxiety with  depression.  She presents today regarding chronic problems which she  wonders might be related to reflux.  She states to me that, for years,  she has had problems with chest pain which radiates to the back and is  associated with shortness of breath.  This lasts approximately 5  minutes.  She states to me that it was suggested some time last year  that this might be reflux disease.  This was suggested despite no  symptoms of pyrosis, water brash, or regurgitation.  She was placed on  Protonix 40 mg b.i.d. for about 6 months.  She thinks this may have made  her symptoms a little better.  She describes 2 to 3 episodes per  month.  She had gone off of her Protonix due to insurance denial.  She  is currently on the drug once daily.  She reports to me a 2-year history  of intermittent dysphagia to solids, pills, and occasionally fluids.  There is some chest discomfort associated with solid food dysphagia.  She has no significant abdominal pain.  there are some problems with gas  and bloating.  Her bowel habits are regular.  There is no bleeding.  She  has had increased weight on prednisone.  Of interest, prednisone seems  to improve symptoms.  She is having a really tough time with her lungs  and is now steroid-dependent.  She is exploring disability.  She  recently was seen in the  emergency room for back and chest pain.  This  was eventually found to be shingles, for which she was placed on  antiviral, higher-dose steroid, and analgesic.  That problem is  improving.  The patient has seen a gastroenterologist in the past.  Specifically, she saw Dr. Charna Elizabeth in October of 2003 when she  underwent screening colonoscopy.  This was reported as normal.  It was  suggested in the emergency room that the patient see a  gastroenterologist and a cardiologist regarding her chest symptoms.  She  refers herself today as I have seen some family members.   PAST MEDICAL HISTORY:  1. Hypertension.  2. Asthma.  3. Chronic obstructive pulmonary disease.  4. Hypothyroidism.  5. Anxiety with depression.  6. Incidental left renal cell carcinoma status post left nephrectomy      in 2002.  7. Status post cholecystectomy in 2002.  8. Status post breast implants in 1999.  9. Status post hysterectomy.  10.Status post tubal ligation.   ALLERGIES:  PENICILLIN.   CURRENT MEDICATIONS:  1. Protonix 40 mg daily.  2. Cymbalta 40 mg daily.  3. Diovan 160 mg daily.  4. Levothyroxine 50 mcg daily.  5. Alprazolam 0.5 mg daily.  6. Theophylline 200 mg daily.  7. B complex 2 daily.  8. Vita-Lea 2 daily.  9. Famciclovir t.i.d.  10.Hydrocodone p.r.n.   FAMILY HISTORY:  No family history of colon cancer.  Brother diagnosed  with pancreatic cancer.  There is also a family history of breast cancer  and ovarian cancer in her aunts.  Mother with heart disease.   SOCIAL HISTORY:  The patient is divorced with 1 daughter.  She lives  alone.  She is a Engineer, agricultural.  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  She does not smoke.  She does use alcohol.   REVIEW OF SYSTEMS:  Per diagnostic evaluation form.   PHYSICAL EXAM:  Pleasant somewhat anxious-appearing female in no acute  distress.  Blood pressure 124/80, heart rate 84, weight 166.4 pounds.  She is 5  feet 6 inches in  height.  HEENT:  Sclerae anicteric.  Conjunctivae pink.  Oral mucosa intact.  Thyroid normal.  Facies are slightly cushingoid.  LUNGS:  Clear to auscultation and percussion.  HEART:  Regular.  ABDOMEN:  Obese and soft without tenderness, mass, or hernia.  Good  bowel sounds are heard.  EXTREMITIES:  Without edema.  SKIN:  Reveals some raised bullus lesions on the lower extremities due  to dermatologic treatment yesterday, as well on the left chest and left  flank.  Some dry vesicles from shingles.   IMPRESSION:  This is a 66 year old female with asthma and steroid-  dependent chronic obstructive pulmonary disease who presents today  regarding possible reflux.  Though the patient may have reflux disease,  she certainly does not have classic symptoms.  She does have  intermittent dysphagia, which will require investigation and possible  treatment.  It is hard for me to say with any confidence that her  intermittent problems with chest pain, back pain, and shortness of  breath are related to reflux.  If that indeed were the case, then it is  quite disappointing that she did not respond to high-dose proton pump  inhibitors.  My suspicion is that she may have reflux, though it would  be unrelated to her pulmonary, chest, and back complaints.   RECOMMENDATIONS:  1. Continue Protonix 40 mg daily.  2. Reflux precautions with attention to weight loss.  3. Schedule upper endoscopy with possible esophageal dilation.  The      nature of the procedure as well as the risks, benefits, and      alternatives have been reviewed.  She understood and agreed to      proceed.  4. Ongoing pulmonary care with Dr. Maple Hudson and general medical care with      Dr. Elisabeth Most.     Wilhemina Bonito. Marina Goodell, MD  Electronically Signed    JNP/MedQ  DD: 02/23/2007  DT: 02/23/2007  Job #: [id number]   cc:   Lovenia Kim, D.O.  Clinton D. Maple Hudson, MD, FCCP, FACP

## 2011-02-25 NOTE — Assessment & Plan Note (Signed)
[facility]                         GASTROENTEROLOGY OFFICE NOTE   NAME:WILSON, [name]                        MRN:          [id number]  DATE:04/23/2007                            DOB:          1946-06-04    HISTORY:  [name] [name] presents today for followup.  She was evaluated in  the office Feb 23, 2007 for chest pain, back pain, and questionable  reflux.  See that dictation for details.  She has a history of asthma  and steroid dependent COPD.  At the time of her initial evaluation she  was felt to have reflux though it was unclear whether this explained the  entirety of her pulmonary and chest symptoms.  She also had intermittent  dysphagia.  At that time she was on Protonix once daily and advised with  regards to reflux precautions.  She subsequently underwent upper  endoscopy Mar 03, 2007.  She was found to have grade A esophagitis as  well as a benign distal stricture.  The stomach and duodenum were  normal.  She was dilated with a 54 French-Maloney dilator.  She was  asked to increase Protonix to 40 mg b.i.d.  She presents today for  followup.  Post endoscopy she has had no further dysphagia.  No classic  reflux symptoms such as heartburn or indigestion.  No further chest  pain.  She went approximately 2 weeks until she experienced problems  with her breathing for which she had to use her nebulizer.  She  subsequently saw Tammy Parrett March 29, 2007 and was treated with short  course of steroids.  Since that time she has been doing better.   MEDICATIONS:  1. Protonix 40 mg b.i.d.  2. Cymbalta 40 mg daily.  3. Diovan 160 mg daily.  4. Levothyroxine 50 mcg daily.  5. Alprazolam 0.5 mg daily.  6. B-complex.  7. Vitamin supplement.  8. Prednisone taper.  9. Calcium with vitamin D.   PHYSICAL EXAMINATION:  Today finds a well-appearing female in no acute  distress.  Blood pressure is 126/70, heart rate is 72, weight is 160.6  pounds.  LUNGS:   Reveals end inspiratory and end expiratory wheezing though she  is moving air well.  HEART:  Regular.  ABDOMEN:  Soft.   IMPRESSION:  1. Gastroesophageal reflux disease with endoscopic evidence of      esophagitis and peptic stricture.  Status post esophageal dilation.      Currently asymptomatic with regards to classic reflux symptoms and      dysphagia.  2. Chronic problems with asthmatic bronchitis and reactive airway      disease.  Still not clear whether reflux is a contributor.   RECOMMENDATIONS:  1. Continue Protonix 40 mg b.i.d.  2. Return to this office for recurrent problems with dysphagia or      breakthrough heartburn.  3. Ongoing management of pulmonary symptoms per Dr. Maple [name] and general      medical care per Dr. Elisabeth [name].     [name] [name]. Marina Goodell, MD  Electronically Signed    JNP/MedQ  DD: 04/23/2007  DT: 04/23/2007  Job #: [id number]   cc:   [name] [name]. [name] Hudson, MD, FCCP, FACP  [name] [name], D.O.

## 2011-02-25 NOTE — Assessment & Plan Note (Signed)
[facility]                         GASTROENTEROLOGY OFFICE NOTE   NAME:WILSON, DEVAN BABINO                        MRN:          [id number]  DATE:12/10/2007                            DOB:          Mar 28, 1946    PROBLEM:  Reflux and chest pain.   HISTORY OF PRESENT ILLNESS:  This is a pleasant 65 year old white female  known to Dr. Marina Goodell, who has had significant problems with acid reflux.  She also has history of asthma and steroid dependent COPD and she feels  that all of her symptoms seem to tie together. She has been having a  hard time over the past month or so, initially with an upper respiratory  infection, which required a course of antibiotics and steroids and is  now feeling that her acid reflux is flaring, despite being on b.i.d.  Protonix. She actually had a visit to the emergency room Friday a week  ago because she developed some stabbing sensation of back pain in the  mid chest, which then went through to the front. She then started having  some discomfort in her right arm and was afraid that she was having a  myocardial infarction. Her cardiac workup was negative in the emergency  room and she was told that she may have had esophageal spasm. She is  status post remote cholecystectomy. She last underwent endoscopy with  Dr. Marina Goodell in May 2008 for dysphagia, which was vague and chest pain and  was found to have moderate reflux esophagitis and a distal stricture,  which was dilated to #54. She said she felt much better after the  endoscopy adn dilation and that definitely helped her symptoms for  several months and then gradually, she has relapsed. She is interested  in having another endoscopy. The patient has been following a strict  anti-reflux regimen and does have the head of her bed elevated. She said  that she is eating healthier now than she has ever eaten in her life.   CURRENT MEDICATIONS:  Protonix 40 b.i.d.  Cymbalta 20 b.i.d.  Diovan 160 daily  Levothyroxine 50 mcg daily  vitamin D daily  Flax seed daily  multivitamin daily   ALLERGIES:  PENICILLIN   PHYSICAL EXAMINATION:  GENERAL:  A well developed white female in no  acute distress.  VITAL SIGNS:  Weight 158. Blood pressure 100/70, pulse of 76. She is  somewhat hoarse.  CARDIOVASCULAR:  Regular rate and rhythm. S1 and S2. No murmur, rub, or  gallop.  PULMONARY:  A few scattered rhonchi and end-expiratory wheezing.  ABDOMEN:  Soft and mildly minimally diffusely tender. No guarding or  rebound. Bowel sounds active.   IMPRESSION:  41. A 65 year old female with chronic gastroesophageal reflux disease      with exacerbation, rule out  recurrent stricture, rule out active      esophagitis.  2. Asthma.  3. Chronic obstructive pulmonary disease.   PLAN:  1. The patient feels that Nexium is more effective for her than      Protonix, however, she insurance will not cover Nexium. Have given  her a couple of weeks worth of samples of Nexium to see if this      will help get her under control. She will take this twice daily and      then resume her Protonix 40 mg b.i.d.  2. Re-emphasized strict antireflux precautions, elevation of the head      of the bed, etc.  3. Schedule upper endoscopy with possible dilation with Dr. Marina Goodell.      Mike Gip, PA-C  Electronically Signed      Venita Lick. Russella Dar, MD, [facility]  Electronically Signed   AE/MedQ  DD: 12/10/2007  DT: 12/11/2007  Job #: [id number]   cc:   Wilhemina Bonito. Marina Goodell, MD

## 2011-02-28 NOTE — Discharge Summary (Signed)
[facility]  Patient:    [name] [name], [name] [name]                  MRN: [id number] Adm. Date:  [id number] Disc. Date: [id number] Attending:  Lindaann Slough CC:         [name], M.D.   Discharge Summary  DISCHARGE DIAGNOSES: 1. Left renal cell carcinoma. 2. Contracted gallbladder.  PROCEDURES:  Left radical nephrectomy and cholecystectomy on May 18, 2001.  HISTORY OF PRESENT ILLNESS:  The patient is a 65 year old female who was seen in the emergency room in July 2002 complaining of abdominal pain.  An ultrasound of the abdomen showed a contracted gallbladder and a mass in the left kidney.  A CT scan of the abdomen showed a 4.3 cm mass in the midpole of the left kidney extending into the collecting system.  The patient was admitted on May 18, 2001 for a radical nephrectomy and cholecystectomy.  PHYSICAL EXAMINATION:  VITAL SIGNS:  Blood pressure was 132/80, pulse 64, respiration 12, temperature 98.1.  Height 5 feet 4 inches.  HEENT:  Head is normal.  Pupils are equal and reactive to light and accommodation.  Ears, nose, and throat within normal limits.  NECK:  Supple.  No cervical lymph node.  No thyromegaly.  CHEST:  Symmetrical.  Lungs fully expanded and clear to auscultation and percussion.  HEART:  Regular rate.  No murmur and no gallop.  ABDOMEN:  Soft, nontender, and nondistended.  Liver, spleen, and kidney are not palpable.  No organomegaly.  Bowel sounds normal.  GENITALIA:  The meatus is normal.  She is status post hysterectomy.  RECTAL:  Sphincter tone is normal.  There is no rectal mass.  LABORATORY DATA:  Her hemoglobin on admission was 12.8, hematocrit 37.7, WBC 5.4.  Sodium 141, potassium 3.9, BUN 14, creatinine 1.0.  Urinalysis showed 0 to 2 wbcs and her urine culture was negative.  HOSPITAL COURSE:  The patient had a left radical nephrectomy and cholecystectomy done on May 18, 2001.  She remained afebrile.  She  had episodes of nausea and vomiting on the second day postoperatively and that was treated with Phenergan.  Her postoperative course was otherwise uneventful.  She was started on liquid diet on the second day postoperatively which she tolerated well.  On May 22, 2001, she was afebrile.  She was eating well.  Her wound was clean and dry and she was voiding well.  DISCHARGE MEDICATIONS: 1. She was then discharged home on Toradol 10 mg p.o. q.6h. p.r.n. pain. 2. Accupril 20 mg p.o. q.d. for hypertension. 3. Percocet 5/325 with 1 tablet p.o. q.4h. p.r.n.  DIET:  Regular.  CONDITION ON DISCHARGE:  Improved.  DISCHARGE INSTRUCTIONS:  The patient is advised not to do any lifting, straining, or driving until further advised.  The patient will be followed by myself and Dr. Luisa Hart L. Ballen. DD:  05/28/01 TD:  05/30/01 Job: [id number]

## 2011-02-28 NOTE — Assessment & Plan Note (Signed)
[facility]                               PULMONARY OFFICE NOTE   NAME:[name], [name] MEANOR                        MRN:          [id number]  DATE:07/01/2006                            DOB:          July 24, 1946    PULMONARY CONSULTATION   PROBLEM:  Pulmonary consultation at the request of Dr. Marisue Brooklyn for  this 65 year old former smoker complaining of shortness of breath and  recurrent bronchitis.  She describes a history of intermittent bronchitis  back to childhood and says she was diagnosed as having asthma at age 49,  which would be 29 years ago, at which time she quit smoking one-half pack  per day.  This summer, in particular, she hasn't been able to get her chest  clear.  She notices shortness of breath on short walks and says shortness of  breath and cough wake her at night.  There has been little change from one  day to the next.  She does not notice any sinus pressure, drainage or  discomfort.  She got a nebulizer machine and some samples of a  bronchodilator solution, which may have helped some.  Cough is constant and  productive of yellow sputum.  She took three cycles of antibiotics and one  prednisone taper.  The prednisone helped considerably but put her in a very  bad mood.  She was tried on Advair and recently changed to Symbicort with a  spacer but she is not sure if this has made a difference yet.  Especially  with coughing, she notices some discomfort at the left upper quadrant or  left lower costal margin which concerns her because of a history of left  nephrectomy for renal cancer.  She asks reassurance that she doesn't have a  respiratory cancer and we discussed this.   MEDICATIONS:  1. Accupril.  2. Cymbalta.  3. Levothroid.  4. Multivitamin.  5. Symbicort 160/4.5 two puffs b.i.d. with spacer.  6. Home nebulizer with unknown nebulizer solution.  7. Ventolin HFA inhaler.   ALLERGIES:  DRUG INTOLERANT of PENICILLIN.   REVIEW OF SYSTEMS:  Weight has been stable.  Dyspnea at rest and with  activity.  Productive cough.  Occasional sore throat and some difficulty  swallowing, sneezing, depression, yellow and sometimes green discoloration  of sputum, tussive discomfort left upper quadrant at the costal margin as  noted.  She denies any awareness of reflux or significant postnasal  drainage.  Sneeze and occasional rhinorrhea blamed on ragweed, fall usually  worse than spring.   PAST MEDICAL HISTORY:  1. Bronchitis in childhood with diagnosis of asthma at age 64.  2. Hypertension.  3. Left renal cell cancer treated with nephrectomy without additional      therapies.  4. Sleep apnea.  5. Cholecystectomy.  6. Hysterectomy in 1989.  7. Breast augmentation in 1999.  8. Bilateral tubal ligation 1982.  9. Appendectomy 1989.  10.No tuberculosis exposure.  11.No unusual reactions to latex, iodine or contrast dye or aspirin.   SOCIAL HISTORY:  Quit smoking one-half pack per day 29 years ago.  Occasional social alcohol.  She is divorced with children.  She lives with a  boyfriend.  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   FAMILY HISTORY:  Father was a smoker, dying of lung cancer.  Family history  of emphysema, asthma, heart disease and cancer.   PHYSICAL EXAMINATION:  VITAL SIGNS:  Weight 157 pounds.  Blood pressure  142/92, pulse regular 81.  Room air saturation 99%.  GENERAL APPEARANCE:  This is a pleasant, physically comfortable-appearing  woman, medium build.  SKIN:  No rash.  ADENOPATHY:  None at the neck, shoulders or axillae.  HEENT:  Nose and throat clear with no evidence of drainage or erythema.  She  had her own teeth.  Voice quality was normal.  There was no neck vein  distention, thyromegaly or stridor.  CHEST:  Inspiratory and expiratory coarse wheeze and mild rhonchi without   dullness or pleural rub.  She was not using accessory muscles.  HEART:  Regular rhythm. No murmur or gallop.  ABDOMEN:  No enlargement of liver or spleen.  EXTREMITIES:  No cyanosis, clubbing or edema.   LABORATORY DATA:  A pulmonary functions test is available from May 19, 2006 at [facility] showing severe obstructive airways disease.  FEV1 was 1.11L (44% of predicted).  FVC was 2.48L (79%).  There had been a  significant response to bronchodilator.  Total lung capacity was mildly  reduced at 76% of predicted.  Diffusion was normal at 106% of predicted,  which would support an impression of asthma over emphysema.  Chest x-ray  report from June 17, 2006 described no acute cardiopulmonary process.  Heart size was normal.  Lung parenchyma clear.  No pleural effusion.   IMPRESSION:  1. Bronchitis or asthmatic bronchitis.  2. Chronic obstructive pulmonary disease with persistent exacerbation.  3. Tussive left upper quadrant discomfort, not duplicated by finger      pressure in the left upper quadrant at this time.  This is the area      where she had her left nephrectomy.  4. Possible ACE inhibitor-induced cough, although this is not usually      productive.  5. Some history of sleep apnea, which we did not explore today.  6. History of left nephrectomy for cancer.   PLAN:  1. CT scan of chest with contrast.  2. Try Symbicort 160/4.5 on a maintenance basis as she uses up her sample,      with puffs b.i.d.  3. Try sample Spiriva once daily.  4. Doxycycline for 7 days.  5. Schedule return in one month, earlier p.r.n.   I appreciate the chance to meet her and hope we can find some ways to help.                                   Clinton D. Maple Hudson, MD, FCCP, FACP   CDY/MedQ  DD:  07/01/2006  DT:  07/03/2006  Job #:  [id number]

## 2011-02-28 NOTE — Assessment & Plan Note (Signed)
[facility]                             PULMONARY OFFICE NOTE   NAME:[name] [name], [name] [name]                        MRN:          [id number]  DATE:10/27/2006                            DOB:          [birth year]    PROBLEMS:  1. Asthma with chronic obstructive pulmonary disease.  2. History of left renal cancer/nephrectomy.   HISTORY OF PRESENT ILLNESS:  She comes today with her friend.  She had  needed prednisone while visiting in [place] and said it helped.  She  cancelled her pulmonary function test because she was feeling sick and  needed the prednisone.  She has felt gradually more short of breath with  exertion over the last 8 months or so and is now using her nebulizer 2-4  times a day, especially in the past five days.  It does help.  She does  not think she gets in enough medication from her metered inhalers.  She  considers her Intal inhaler the best for quick relief which I would  consider an unusual assessment.  Asmanex was no help, and her Ventolin  metered inhaler is not as good as the nebulizer.  In the last few days,  she has had increased cough productive of yellow sputum.  At a recent  physical exam, Dr. Andria Meuse had given her a Z-pack, and she missed a day  or so of work.  In the past two weeks, she has had some vertex headache  and pains in her upper legs at night.  She is worried about her oxygen  levels.  She is not having exertional leg pain or chest pain.  There has  been no blood and no chills.  Her CT scan from September 25 showed no  acute findings and no evidence of malignancy.  It was done without  contrast because of her previous nephrectomy.  There was some central  airway thickening consistent with bronchitis and linear scarring or  atelectasis in the left lower lobe.  I explained atelectasis.  No  obvious acute process was recognized at the site of her previous  nephrectomy in the upper abdomen.  She is still followed by Dr.  Brunilda Payor.   MEDICATIONS:  1. Levothroid.  2. Multivitamins.  3. Calcium with vitamin D.  4. Spiriva.  5. Home nebulizer with albuterol.  6. Ventolin HFA inhaler.  7. Xanax 0.5 mg p.r.n.  8. Protonix.  9. Diovan 160 mg.  10.Intal inhaler q.i.d.  11.She has stopped Symbicort, which she did not feel helped.  12.Drug intolerance to penicillin.   OBJECTIVE:  Weight 165 pounds, blood pressure 122/88, pulse 77, room air  saturation 96%.  Inspiratory and expiratory diffuse wheezing is evident,  even during quiet speech.  There is no retraction.  Heart sounds are  regular, I do not hear murmur or gallop.  I do not see neck vein  distention or peripheral edema, cyanosis or clubbing.  She has a long  palate which obscures the posterior pharynx, but I do not see post nasal  drainage.  I do not find adenopathy.   IMPRESSION:  Exacerbation of asthma with COPD.  We really need pulmonary  functions here.   PLAN:  1. Arterial blood gas.  2. Pulmonary function tests.  3. We are going to repeat a CT scan of the chest and also a CT of the      sinuses because of her headaches.  They were concerned about the      possibility of endobronchial obstruction, referring to a dissimilar      case they knew of.  So, I felt it was important that we reimage her      against evidence of progressive atelectasis.  She is given Depo-      Medrol 80 mg IM and instructed to start her prednisone eight day      taper from 40 mg down to maintenance prednisone 10 mg daily with      steroid talk done and questions answered.  4. Schedule return in three weeks to review results of labs.     Clinton D. Maple Hudson, MD, Tonny Bollman, FACP  Electronically Signed    CDY/MedQ  DD: 10/27/2006  DT: 10/28/2006  Job #: [id number]   cc:   Lovenia Kim, D.O.

## 2011-02-28 NOTE — Assessment & Plan Note (Signed)
[facility]                             PULMONARY OFFICE NOTE   NAME:[name], [name] PADMANABHAN                        MRN:          [id number]  DATE:12/17/2006                            DOB:          [birth year]    PROBLEM LIST:  1. Asthma with chronic obstructive pulmonary disease.  2. History of left renal cancer/nephrectomy.   HISTORY OF PRESENT ILLNESS:  She has not found it possible to function  off of prednisone. Two of the 5 mg tabs is barely enough but this does  allow her to walk and be effective in her job. She has been using Intal  as a rescue inhaler and feels she needs it a little more frequency  lately. She is trying to do some walking in the neighborhood. She has  been using Perforomist b.i.d. p.r.n. by nebulizer and prefers it over  albuterol.   MEDICATIONS:  Levothroid, multivitamins, and calcium. Spiriva, Xanax 0.5  mg b.i.d. p.r.n., Protonix b.i.d., Diovan 160 mg, Intal inhaler q.i.d.  p.r.n., prednisone 5 mg tabs 1 or 2 daily, Perforomist nebulizer  solution b.i.d. p.r.n.   ALLERGIES:  PENICILLIN. She had not found that Symbicort worked for her.   OBJECTIVE:  VITAL SIGNS:  Weight 166 pounds. Blood pressure 128/82.  Pulse regular at 78. Room air saturation 99%.  LUNGS:  Trace end-expiratory wheeze bilaterally. Unlabored. She can  speak in long sentences.  HEART:  Sounds are regular without murmur or gallop.  EXTREMITIES:  There is no edema.   IMPRESSION:  Asthma with chronic obstructive pulmonary disease, steroid  dependent.   PLAN:  We refilled Xanax 0.5 mg b.i.d. for p.r.n. use. Walk as  tolerated. We may need to try an oral albuterol product at some point,  since she does not seem to benefit from most metered inhaled products.  Schedule return in 3 months, earlier p.r.n.     [name] D. Maple Hudson, MD, [name] [name], FACP  Electronically Signed    CDY/MedQ  DD: 12/20/2006  DT: 12/20/2006  Job #: [id number]   cc:   [name] [name], D.O.  [name] [name], M.D.

## 2011-02-28 NOTE — Assessment & Plan Note (Signed)
[facility]                               PULMONARY OFFICE NOTE   NAME:[name] [name], [name] [name]                        MRN:          [id number]  DATE:08/28/2006                            DOB:          06-28-1946    PULMONARY FOLLOW UP:   PROBLEM LIST:  1. Asthma with chronic obstructive pulmonary disease.  2. History of left renal cancer/nephrectomy.   HISTORY:  She had seen the nurse practitioner August 21, 2006 with  increased dyspnea and a complaint of tickle in throat and chest with  hoarseness. She was treated with Zithromax and nebulizer treatment and  Mucinex DM. She just finished the Zithromax and says she still feels  somewhat congested. She does not think she has caught a cold. She had talked  with a friend of hers who is on low dose maintenance prednisone and asked  about that as a possibility. Her concern is that her job requires her to  walk and to talk, given tours of [employer] and she does not have the  breath and begins coughing too easily.  She is not at all aware of reflux  but then does admit to occasional substernal burning while on Protonix.   MEDICATIONS:  Levothroid, Symbicort 160/4.5 2 puffs b.i.d. with a spacer,  Spiriva, Xanax 0.5 mg p.r.n., Protonix, Diovan 160, home nebulizer, Ventolin  HFA.   DRUG INTOLERANCE:  PENICILLIN.   Has had flu shot.   OBJECTIVE:  Weight 160 pounds. Blood pressure 126/74, pulse regular, 79.  Room air saturation 97%.  Mild end expiratory wheezing, unlabored. Speech is  conversational. There is no neck vein distention, cyanosis or edema. Heart  sounds are regular without murmur.   Chest x-ray report from April of this year showed no acute cardiopulmonary  process. There were epigastric clips noted. CT of the chest September 25th  showed no acute chest findings. No evidence of intrathoracic malignancy.  There was some airway thickening and scarring or atelectasis in the left  lower lobe and  post surgical changes in the upper abdomen.   Her cough may have been improved from changing from lisinopril to Diovan.  Her more pressing complaint now however is exertional dyspnea. I do not have  a current PFT. There is an old one on the chart from 1996 which was normal  except for cough at that time.   IMPRESSION:  Asthma with chronic obstructive pulmonary disease.   PLAN:  1. Continue reflux precautions and therapy.  2. Add Ental 2 puffs x4 daily.  3. Refill Xanax 0.5 mg for up to b.i.d. use.  4. Schedule return in 2 months, earlier p.r.n. We will look at scheduling      pulmonary function tests at that time.     Clinton D. Maple Hudson, MD, Tonny Bollman, FACP  Electronically Signed    CDY/MedQ  DD: 08/29/2006  DT: 08/29/2006  Job #: [id number]   cc:   Lovenia Kim, D.O.

## 2011-02-28 NOTE — H&P (Signed)
[facility]  Patient:    [name] [name], [name] [name]                  MRN: [id number] Adm. Date:  [id number] Attending:  Lindaann Slough                         History and Physical  CHIEF COMPLAINT:  Abdominal pain and left renal mass.  HISTORY OF PRESENT ILLNESS:  Patient is a 65 year old female who went to the emergency room about a month ago complaining of abdominal pain.  An ultrasound of the abdomen showed a contracted gallbladder and a mass in the left kidney. CT scan of the abdomen showed a 4.3-cm mass in the mid-pole of the left kidney, extending into the collecting system.  Patient is now admitted for a left radical nephrectomy and cholecystectomy.  PAST MEDICAL HISTORY:  She has a history of hypertension.  PAST SURGICAL HISTORY:  She had a hysterectomy in 1989.  MEDICATIONS:  She is on Accupril 20 mg p.o. daily.  ALLERGIES:  No known drug allergies.  FAMILY HISTORY:  There is a family history of hypertension.  Her mother is 27 years old and has a history of hypertension.  Her father died in his 66s of unknown causes to her.  SOCIAL HISTORY:  She is divorced and quit smoking about 10 years ago.  She has one child who is 81 years old.  REVIEW OF SYSTEMS:  No cough.  No shortness of breath.  No hemoptysis. CARDIOVASCULAR:  No palpitations.  No chest pain.  GI:  No nausea.  No vomiting.  No diarrhea or constipation.   GU:  No frequency, hesitancy or dysuria.  PHYSICAL EXAMINATION:  GENERAL:  This is a well-built 65 year old female in no acute distress.  VITAL SIGNS:  Her blood pressure is 132/80, pulse 64, respirations 12, temperature 98.1.  Height 5 feet 4 inches.  HEENT:  Her head is normal.  Pupils equal, round and reactive to light and accommodation.  Ears, nose and throat within normal limits.  NECK:  Supple.  No cervical lymph nodes.  No thyromegaly.  CHEST:  Symmetrical.  LUNGS:  Fully expanded and clear to percussion and  auscultation.  HEART:  Regular rhythm.  No murmur.  No gallops.  ABDOMEN:  Soft, nondistended, nontender.  Liver, spleen and kidneys not palpable.  No organomegaly.  Bowel sounds normal.  GENITALIA:  Meatus is normal.  On pelvic examination, she is status post hysterectomy and there is no mass.  RECTAL:  Sphincter tone is normal.  No rectal mass.  IMPRESSION: 1. Left renal cell carcinoma. 2. Cholecystitis. 3. Hypertension. DD:  05/18/01 TD:  05/18/01 Job: [id number]

## 2011-02-28 NOTE — Op Note (Signed)
   NAME:  [name] [name], [name] [name]                           ACCOUNT NO.:  [id number]   MEDICAL RECORD NO.:  [id number]                   PATIENT TYPE:  AMB   LOCATION:  ENDO                                 FACILITY:  [facility]   PHYSICIAN:  Anselmo Rod, M.D.               DATE OF BIRTH:  [birth year]   DATE OF PROCEDURE:  08/11/2002  DATE OF DISCHARGE:  08/11/2002                                 OPERATIVE REPORT   PROCEDURE PERFORMED:  Colonoscopy with biopsy.   ENDOSCOPIST:  Charna Elizabeth, M.D.   INSTRUMENT USED:  Olympus video colonoscope (adjustable pediatric scope).   INDICATIONS FOR PROCEDURE:  Screening colonoscopy being performed in a 65-  year-old white female with a family history of colon and breast cancer.  The  patient has a personal history of renal cancer and had a nephrectomy in the  past.   PREPROCEDURE PREPARATION:  Informed consent was procured from the patient.  The patient was fasted for eight hours prior to the procedure and prepped  with a bottle of magnesium citrate and a gallon of NuLytely the night prior  to the procedure.   PREPROCEDURE PHYSICAL:   GENERAL:  The patient has stable vital signs.   NECK:  Supple.   CHEST:  Clear to auscultation.  S1, S2 regular.   ABDOMEN:  Soft with normal bowel sounds.   DESCRIPTION OF PROCEDURE:  The patient was placed in the left lateral  decubitus position and sedated with 100 mg of Demerol and 10 mg of Versed  intravenously.  Once the patient was adequately sedated and maintained on  low-flow oxygen and continuous cardiac monitoring, the Olympus video  colonoscope was advanced from the rectum to the cecum and terminal ileum  without difficulty.  The patient had a prominent appendiceal orifice.  Biopsies were done to rule out carcinoid syndrome.  The rest of the colonic  mucosa appeared healthy and without lesions.   IMPRESSION:  1. Normal colonoscopy except for a prominent appendiceal orifice, biopsies     done to rule  out carcinoid results, labs pending.  2. No other masses or polyps seen.    RECOMMENDATIONS:  1. Await pathology results.  2. Outpatient follow-up in the next 7-10 days for further recommendations.                                               Anselmo Rod, M.D.    JNM/MEDQ  D:  08/15/2002  T:  08/15/2002  Job:  [id number]   cc:   Leonie Man, M.D.  [address]  [place]  [place] [address]  Fax: [phone]   Lindaann Slough, M.D.

## 2011-02-28 NOTE — Assessment & Plan Note (Signed)
[facility]                               PULMONARY OFFICE NOTE   NAME:[name], [name] PATRON                        MRN:          [id number]  DATE:08/21/2006                            DOB:          [birth year]    HISTORY OF PRESENT ILLNESS:  The patient is a 65 year old white female  patient of Dr. Roxy Cedar, who has a history of recurrent cough and bronchitis,  presents for an acute office visit.  The patient presents today complaining  that over the last 3 days she has had some nasal congestion, postnasal drip,  hoarseness and cough.  The patient had been seen approximately 1 month ago,  and at that time had been having difficulties with recurrent cough and  bronchitis.  She was felt to have a possible ACE inhibitor-induced cough  and/or reflux.  The patient was changed from her ACE inhibitor over to an  ARB, and given a trial of Symbicort and Protonix.  The patient reports that  she was substantially improved with almost total resolution of her cough and  congestion.  A CT of the chest was essentially unremarkable for acute  infection.  The patient denies any hemoptysis, chest pain, orthopnea, PND or  leg swelling.   PHYSICAL EXAMINATION:  The patient is a pleasant female in no acute  distress.  She is afebrile, stable vital signs.  O2 saturation is 97% on room air.  HEENT:  Nasal mucosa reveals some mild erythema.  Nontender sinuses to  percussion.  The posterior pharynx is clear.  NECK:  Supple without adenopathy.  LUNGS:  Lung sounds are clear.  CARDIAC:  Regular.  ABDOMEN:  Soft and benign.  EXTREMITIES:  Warm without any edema.   IMPRESSION AND PLAN:  Acute upper respiratory infection in a patient with  history of irritable airways.  The patient is to begin a nasal hygiene  regimen with saline and Nasonex, add Mucinex DM twice daily.  The patient  advised on cough suppression regimen with non-mint lozenges and Mucinex DM.  The patient will begin  a Z-Pak and follow back up with Dr. Maple Hudson in 1 week  as scheduled, or sooner if needed.      Rubye Oaks, NP  Electronically Signed      Clinton D. Maple Hudson, MD, Tonny Bollman, FACP  Electronically Signed   TP/MedQ  DD: 08/21/2006  DT: 08/22/2006  Job #: [id number]

## 2011-02-28 NOTE — Op Note (Signed)
[facility]  Patient:    [name] [name], [name] [name]                  MRN: [id number] Proc. Date: 05/18/01 Adm. Date:  [id number] Attending:  Lindaann Slough CC:         [name], M.D.   Operative Report  PREOPERATIVE DIAGNOSIS:  Left renal cell carcinoma and contracted gallbladder.  POSTOPERATIVE DIAGNOSIS:  Left renal cell carcinoma and contracted gallbladder.  PROCEDURE PERFORMED:  Left radical nephrectomy and cholecystectomy by Dr. Lurene Shadow.  SURGEONS:  Lindaann Slough, M.D. and [name], M.D.  ANESTHESIA:  General.  INDICATIONS:  The patient is a 65 year old female who was seen in the emergency room about one month ago with abdominal pain.  An ultrasound of the abdomen showed a contracted gallbladder and a mass in the left kidney.  A CT scan showed a 4.3 cm mass in the mid pole of the left kidney, extending into the collecting system.  The patient is now scheduled for a left radical nephrectomy and a cholecystectomy.  The risks of the procedure were discussed with the patient to include and are not limited to injury to adjacent organs, hemorrhage or infection.  She also understands that the tumor could be benign. She understands and is agreeable.  DESCRIPTION OF PROCEDURE:  Under general anesthesia, the patient was prepped and draped and placed into the supine position.  A longitudinal incision was made from the xiphoid to and around the umbilicus.  The incision was carried down to the subcutaneous tissues.  The fascia was then incised and the peritoneal cavity was entered.  The liver and spleen are normal.  A longitudinal incision was made on the left paracolic gutter, and the descending and transverse colon were mobilized medially.  Then, the renal vessels were identified.  The renal artery was freed from the surrounding tissue and ligated with #1 silk and cut in between ligatures with two sutures on the remaining stump and the  stump was also ligated with Hemoclip.  The renal vein was then freed from the surrounding tissues and ligated with #1 silk, and cut in between ligatures, and with those sutures on the remaining stump and the stump of the renal vein was also ligated with Hemoclip.  The gonadal vein was ligated with Hemoclips and cut in between those Hemoclips. The kidney was then bluntly and sharply dissected from surrounding tissue, and the ureter was ligated with #0 Vicryl and cut in between ligatures.  The adrenal vein was dissected from the surrounding tissues and ligated with Hemoclip and cut in between the Hemoclips.  The upper pole of the kidney was then freed from the surrounding tissues and dissected in between Hemoclips and the specimen was removed in toto including the adrenal gland.  The wound was then copiously irrigated with normal saline.  The cholecystectomy was then done by Dr. Lurene Shadow and he will dictate the surgical note.  There was no evidence of bleeding in the renal vein.  Then, the descending colon and transverse colon were replaced in the abdomen in the normal position.  The fascia was then closed with #1 PDS and the skin was closed with #4-0 Monocryl using subcuticular sutures.  Needle, sponge, and instrument counts were correct on two occasions.  Blood loss minimal.  The patient tolerated the procedure well and left the OR in satisfactory condition to the postanesthesia care unit. DD:  05/18/01 TD:  05/19/01 Job: [id number]

## 2011-02-28 NOTE — Assessment & Plan Note (Signed)
[facility]                               PULMONARY OFFICE NOTE   NAME:WILSON, MARCIEL OFFENBERGER                        MRN:          [id number]  DATE:07/14/2006                            DOB:          [birth year]    PROBLEM:  1. Asthma with chronic obstructive pulmonary disease.  2. Perennial allergic rhinitis.  3. Bronchitis.  4. Prior left nephrectomy for renal cell carcinoma.   HISTORY:  She complains of persistent easy exertional dyspnea while talking  or walking, and she remains embarrassed by cough when she is trying to work.  She has tried a sample of Singulair from Dr. Elisabeth Most, and she has tried a  switch from Advair to Symbicort 160/4.5 suggested by me without any benefit  from either.  She is using Spiriva now, but it does not seem to have made  much difference.  She says she has been paying careful attention and does  not recognize heartburn.  She is convinced doxycycline was well tolerated  and reduced the production of yellow-green sputum while she was on it.  She  still notices left upper abdominal discomfort.   MEDICATIONS:  1. Accupril 20 mg.  2. Cymbalta.  3. Levothroid.  4. Symbicort 160/4.5.  5. Spiriva.  6. Xanax 0.5 mg p.r.n.  7. Home nebulizer.  8. Ventolin HFA.   ALLERGIES:  Drug intolerant to PENICILLIN.   OBJECTIVE:  Weight 156 pounds, BP 118/80, pulse regular 76, oxygen  saturation initially at rest was 93%.  After letting her sit a little  longer, it was 98% on room air.  She has a loose cough, dry-type throat clearing and red-looking throat  without exudates or stridor, watery sniffing.  Definite bilateral expiratory wheeze without dullness.  No edema or clubbing.   CT scan of chest on September 25 showed no acute chest findings, no evidence  of intrathoracic malignancy.  Some central airway thickening and linear  scarring or atelectasis in her left lower lobe.  Postsurgical changes in the  abdomen with the  pancreatic tail displaced in the nephrectomy bed, but no  mass or inflammatory change.   IMPRESSION:  1. Asthmatic bronchitis with a fixed obstructive component.  2. Cough, likely multifactorial, with bronchitis, history of allergic      rhinitis, possibility of reflux and her Accupril all noted as      possibilities.  She gave me a late description that she experiences      pain in the right biceps or upper arm area sometimes when lying down at      night, which may relate to the suspicion of reflux, but does not seem      exertional or cardiovascular.   PLAN:  1. Doxycycline for 2 weeks.  2. Stop Accupril and replace it with Diovan 160 mg daily.  3. Low-grade prednisone trial with 20 mg for 2 days, then 10 mg daily,      using 5 mg tablets.  4. Stop Spiriva and Singulair.  5. Permission to use her Xanax 0.5 mg, one-half to one tablet b.i.d.  p.r.n. for anxiety, especially while on prednisone.  6. Consider upper GI for evaluation of possible reflux or aspiration when      she returns in 1 month, earlier p.r.n.       Clinton D. Maple Hudson, MD, FCCP, FACP      CDY/MedQ  DD:  07/14/2006  DT:  07/16/2006  Job #:  [id number]   cc:   Lovenia Kim, D.O.

## 2011-02-28 NOTE — Assessment & Plan Note (Signed)
[facility]                             PULMONARY OFFICE NOTE   NAME:[name], [name] ROSEMEYER                        MRN:          [id number]  DATE:11/17/2006                            DOB:          1946-07-19    PROBLEM:  1. Asthma with chronic obstructive pulmonary disease.  2. History of left renal cancer/nephrectomy.   HISTORY:  I again reviewed her CT scan comparing October 28, 2006 with  her September 24 study.  The September study had described central  airway thickening and linear scarring or atelectasis in the left lower  lobe.  The report emphasized no evidence of intrathoracic malignancy,  but she was concerned because of her history of cancer that the repeat  study was done, and this described mild central bronchiectasis,  bronchial wall thickening and chronic bronchitis, unchanged from  July 06, 2006.  Specifically, with some small nodules in the minor  fissure, unchanged and considered likely to be subpleural lymph nodes.  No pleural fluid.  No pathological adenopathy on a non-contrast study.  Heart size normal, and with no worrisome bone changes or changes  associated with her left nephrectomy.  She has felt much better with  much less exertional dyspnea since we have had her on maintenance  prednisone 10 mg daily.  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  We have thoroughly discussed  steroid side effects and choices.   MEDICATIONS:  1. Levothroid.  2. Multivitamin.  3. Calcium with vitamin D.  4. Symbicort was replaced with nebulizer albuterol and Ventolin HFA.  5. Ental 2 puffs q.i.d.  6. She does continue Spiriva.  7. Xanax 0.5 mg.  8. Protonix.  9. Diovan 160 mg.   DRUG INTOLERANT TO PENICILLIN.   OBJECTIVE:  Weight 165 pounds, which is stable.  BP 136/82.  Pulse 85.  Room air saturation 99%.  Breath sounds are somewhat diminished, but clear and unlabored.  There is no  neck vein distention or stridor.  No nasal obstruction.  HEART SOUNDS:  Regular without murmur.  There is no cyanosis or peripheral edema.   LABORATORY:  An arterial blood gas on room air on October 30, 2006:  A  pH 7.45, PCO2 34, PO2 82, bicarbonate 25.  Pulmonary function tests at  [facility] showed severe obstructive airways disease with significant response  to bronchodilator and air trapping emphasizing an important asthma  component.  Diffusion was moderately reduced at 64% of predicted.  Total  lung capacity was normal at 101%.  Her FEV1 after an 85% response to  bronchodilator was 1.57 (64% of predicted) supporting impression of  asthma with COPD.   IMPRESSION:  Asthma with chronic obstructive pulmonary disease.  She has  difficulty bronchodilator effect from inhaled medications.  Inhaled  steroids have been insufficient, and low-dose maintenance prednisone  seems to make a very significant difference for her.   PLAN:  1. She will continue Spiriva, her various inhaled and oral medications      including prednisone 10 mg daily, but we  are going to try to reduce      that to 5 mg daily for 2 weeks, and then stop it to see what      happens.  She is given a sample of Proformist 20 mcg per 2 mL to      use b.i.d. by nebulizer.  2. Schedule return in 1 month, earlier p.r.n.     Clinton D. Maple Hudson, MD, Tonny Bollman, FACP  Electronically Signed    CDY/MedQ  DD: 11/21/2006  DT: 11/21/2006  Job #: [id number]   cc:   Lovenia Kim, D.O.  Lindaann Slough, M.D.

## 2011-02-28 NOTE — Op Note (Signed)
[facility]  Patient:    [name] [name], [name] [name]                  MRN: [id number] Proc. Date: 05/18/01 Adm. Date:  [id number] Attending:  Lindaann Slough CC:         [name], M.D. (2 copies)  Lindaann Slough, M.D.   Operative Report  PREOPERATIVE DIAGNOSES: 1. Tumor, left kidney. 2. Nonfunctioning gallbladder.  POSTOPERATIVE DIAGNOSES: 1. Tumor, left kidney. 2. Nonfunctioning gallbladder.  OPERATION: 1. Left radical nephrectomy. 2. Cholecystectomy.  NOTE:  Left radical nephrectomy was carried out by Lindaann Slough, M.D. and will be dictated in a separate note.  Cholecystectomy was carried out by Dr. Lurene Shadow and will be dictated in this note.  SURGEON:  [name], M.D.  ANESTHESIA:  General.  INDICATIONS:  This patient is a 65 year old woman presenting with epigastric pain associated with some nausea and vomiting.  Gallbladder ultrasound shows a contracted gallbladder but without any evidence of stones.  Her symptoms were classic for chronic cholecystitis, however.  On her abdominal ultrasound, there was also noted to be some abnormality of the left kidney, and followup CT scan of this kidney showed a mass within the kidney consistent with a renal cell carcinoma.  She was seen in consultation by Dr. Su Grand and is now brought to the operating room for a combined procedure of left radical nephrectomy and cholecystectomy.  DESCRIPTION OF PROCEDURE:  Following induction of satisfactory anesthesia, the patient was positioned supinely and the abdomen routinely prepped and draped to be included in the sterile operative field.  Exploration of the abdomen was carried out through a midline incision extending from the Xiphoid to and below the umbilicus.  Upon exploration, there was no gross evidence of any metastatic disease within the abdominal cavity.  The gallbladder was somewhat scarred and with some contraction and scarring  around it.  The nephrectomy was carried out by Dr. Brunilda Payor, and following the nephrectomy, packs were placed in the left pericolic gutter and the left upper quadrant.  Attention was then turned to the gallbladder where the gallbladder was grasped and dissected and carried down near the ampulla with isolation of the cystic duct and cystic artery.  The cystic duct was doubly clipped and transected as was the cystic artery.   The gallbladder was then dissected free from the liver bed using electrocautery and maintaining hemostasis throughout the entire course of the dissection.  At the end of the dissection, the liver bed was checked for hemostasis and noted to be dry.   Further evaluation of the abdominal cavity and all areas of dissection in both the left renal bed and right upper quadrant showed no evidence of hemorrhage.  Sponge, instrument and sharp counts were verified, and the wound was closed in layers as follows. The midline was closed with a running suture of #1 PDS.  Subcutaneous tissue was irrigated and skin closed with a running subcuticular stitch and then reinforced with Steri-Strips.  Sterile dressing applied.  Anesthetic was reversed.  The patient moved from the operating room to the recovery room in stable condition having tolerated the procedure well. DD:  05/18/01 TD:  05/18/01 Job: [id number]

## 2011-04-14 ENCOUNTER — Other Ambulatory Visit (HOSPITAL_COMMUNITY): Payer: Self-pay | Admitting: Urology

## 2011-04-14 ENCOUNTER — Ambulatory Visit (HOSPITAL_COMMUNITY)
Admission: RE | Admit: 2011-04-14 | Discharge: 2011-04-14 | Disposition: A | Discharge status: Home / Self Care | Payer: BC Managed Care – PPO | Source: Ambulatory Visit | Attending: Urology | Admitting: Urology

## 2011-04-14 DIAGNOSIS — M47814 Spondylosis without myelopathy or radiculopathy, thoracic region: Secondary | ICD-10-CM | POA: Insufficient documentation

## 2011-04-14 DIAGNOSIS — J4489 Other specified chronic obstructive pulmonary disease: Secondary | ICD-10-CM | POA: Insufficient documentation

## 2011-04-14 DIAGNOSIS — J449 Chronic obstructive pulmonary disease, unspecified: Secondary | ICD-10-CM | POA: Insufficient documentation

## 2011-04-14 DIAGNOSIS — Z905 Acquired absence of kidney: Secondary | ICD-10-CM

## 2011-04-14 DIAGNOSIS — I10 Essential (primary) hypertension: Secondary | ICD-10-CM | POA: Insufficient documentation

## 2011-04-23 ENCOUNTER — Other Ambulatory Visit (HOSPITAL_COMMUNITY): Payer: Self-pay | Admitting: Internal Medicine

## 2011-04-23 DIAGNOSIS — Z1231 Encounter for screening mammogram for malignant neoplasm of breast: Secondary | ICD-10-CM

## 2011-05-12 ENCOUNTER — Ambulatory Visit (HOSPITAL_COMMUNITY): Payer: BC Managed Care – PPO

## 2011-05-19 ENCOUNTER — Ambulatory Visit (HOSPITAL_COMMUNITY)
Admission: RE | Admit: 2011-05-19 | Discharge: 2011-05-19 | Disposition: A | Discharge status: Home / Self Care | Payer: BC Managed Care – PPO | Source: Ambulatory Visit | Attending: Internal Medicine | Admitting: Internal Medicine

## 2011-05-19 DIAGNOSIS — Z1231 Encounter for screening mammogram for malignant neoplasm of breast: Secondary | ICD-10-CM

## 2011-07-04 LAB — URINALYSIS, ROUTINE W REFLEX MICROSCOPIC
Bilirubin Urine: NEGATIVE
Glucose, UA: NEGATIVE
Hgb urine dipstick: NEGATIVE
Ketones, ur: NEGATIVE
Nitrite: NEGATIVE
Specific Gravity, Urine: 1.008
pH: 6

## 2011-07-04 LAB — POCT CARDIAC MARKERS
CKMB, poc: 1.1
Myoglobin, poc: 55.7
Troponin i, poc: <0.05

## 2011-07-04 LAB — BASIC METABOLIC PANEL
BUN: 20
Chloride: 105
GFR calc non Af Amer: 48 — ABNORMAL LOW
Glucose, Bld: 109 — ABNORMAL HIGH
Potassium: 4.1
Sodium: 138

## 2011-07-04 LAB — CBC
HCT: 40
Hemoglobin: 13.7
MCV: 88.1
Platelets: 210
WBC: 10.5

## 2011-07-04 LAB — DIFFERENTIAL
Eosinophils Absolute: 0.6
Eosinophils Relative: 6 — ABNORMAL HIGH
Lymphocytes Relative: 15
Lymphs Abs: 1.6
Monocytes Absolute: 0.3
Monocytes Relative: 3

## 2011-07-04 LAB — D-DIMER, QUANTITATIVE: D-Dimer, Quant: 0.34

## 2011-07-09 LAB — OVA AND PARASITE EXAMINATION: Ova and parasites: NONE SEEN

## 2011-07-24 LAB — BASIC METABOLIC PANEL
Chloride: 105
GFR calc non Af Amer: 48 — ABNORMAL LOW
Glucose, Bld: 123 — ABNORMAL HIGH
Potassium: 4.1
Sodium: 139

## 2011-07-24 LAB — DIFFERENTIAL
Eosinophils Absolute: 0.2
Eosinophils Relative: 3
Lymphocytes Relative: 15
Lymphs Abs: 1.2
Monocytes Absolute: 0.3

## 2011-07-24 LAB — URINALYSIS, ROUTINE W REFLEX MICROSCOPIC
Bilirubin Urine: NEGATIVE
Glucose, UA: NEGATIVE
Hgb urine dipstick: NEGATIVE
Protein, ur: NEGATIVE
Urobilinogen, UA: 1

## 2011-07-24 LAB — CBC
HCT: 38
Hemoglobin: 13.3
MCV: 88.4
RDW: 13.1
WBC: 8.2

## 2011-07-24 LAB — POCT CARDIAC MARKERS: Troponin i, poc: <0.05

## 2011-07-24 LAB — URINE MICROSCOPIC-ADD ON

## 2011-11-10 ENCOUNTER — Telehealth: Payer: Self-pay | Admitting: Emergency Medicine

## 2011-11-10 NOTE — Telephone Encounter (Signed)
Wrong patient

## 2012-01-14 ENCOUNTER — Encounter: Payer: Self-pay | Admitting: *Deleted

## 2012-01-21 ENCOUNTER — Encounter: Payer: Self-pay | Admitting: Gynecology

## 2012-01-21 DIAGNOSIS — N938 Other specified abnormal uterine and vaginal bleeding: Secondary | ICD-10-CM | POA: Insufficient documentation

## 2012-01-22 ENCOUNTER — Encounter: Payer: Self-pay | Admitting: Obstetrics and Gynecology

## 2012-01-22 ENCOUNTER — Ambulatory Visit (INDEPENDENT_AMBULATORY_CARE_PROVIDER_SITE_OTHER): Payer: Medicare Other | Admitting: Obstetrics and Gynecology

## 2012-01-22 ENCOUNTER — Other Ambulatory Visit (HOSPITAL_COMMUNITY)
Admission: RE | Admit: 2012-01-22 | Discharge: 2012-01-22 | Disposition: A | Discharge status: Home / Self Care | Payer: Medicare Other | Source: Ambulatory Visit | Attending: Obstetrics and Gynecology | Admitting: Obstetrics and Gynecology

## 2012-01-22 VITALS — BP 120/76 | Ht 66.0 in | Wt 174.0 lb

## 2012-01-22 DIAGNOSIS — R102 Pelvic and perineal pain: Secondary | ICD-10-CM

## 2012-01-22 DIAGNOSIS — N879 Dysplasia of cervix uteri, unspecified: Secondary | ICD-10-CM | POA: Insufficient documentation

## 2012-01-22 DIAGNOSIS — Z1272 Encounter for screening for malignant neoplasm of vagina: Secondary | ICD-10-CM

## 2012-01-22 DIAGNOSIS — M810 Age-related osteoporosis without current pathological fracture: Secondary | ICD-10-CM

## 2012-01-22 DIAGNOSIS — R350 Frequency of micturition: Secondary | ICD-10-CM

## 2012-01-22 DIAGNOSIS — N951 Menopausal and female climacteric states: Secondary | ICD-10-CM

## 2012-01-22 DIAGNOSIS — Z124 Encounter for screening for malignant neoplasm of cervix: Secondary | ICD-10-CM | POA: Insufficient documentation

## 2012-01-22 DIAGNOSIS — I10 Essential (primary) hypertension: Secondary | ICD-10-CM | POA: Insufficient documentation

## 2012-01-22 DIAGNOSIS — E782 Mixed hyperlipidemia: Secondary | ICD-10-CM | POA: Insufficient documentation

## 2012-01-22 DIAGNOSIS — N952 Postmenopausal atrophic vaginitis: Secondary | ICD-10-CM

## 2012-01-22 DIAGNOSIS — Z780 Asymptomatic menopausal state: Secondary | ICD-10-CM

## 2012-01-22 DIAGNOSIS — N949 Unspecified condition associated with female genital organs and menstrual cycle: Secondary | ICD-10-CM

## 2012-01-22 NOTE — Progress Notes (Signed)
Patient came to see me today with a short history of lower abdominal discomfort. It is associated with abdominal bloating. She is also having urinary frequency. The patient has had a previous total abdominal hysterectomy with left salpingo-oophorectomy done for fibroids. She is having no nausea or vomiting. She is having diarrhea but that preceded the pain by over a year. She has a history of chronic diarrhea with colitis treated by Dr. Loreta Ave and takes a medication for it 3 times a day. She cannot remember the name of the medication. She will get it for [place]. We have also previously diagnosed her with osteoporosis. She elected not to take medication but takes calcium and vitamin D and exercises. Her last bone density was 2008. She has had no fractures. She does use to avoid intermittently. She's also had a nephrectomy for a renal cancer but is 10 years out with no existing disease. She does have vaginal dryness which has not required her treatment. She is also taking hormones in the past for menopausal symptoms but is doing well without them now. She is up-to-date on mammograms.  ROS: 12 system review done. Pertinent positives above. Other positives include hypothyroidism, hypertension, COPD, asthma,an esophageal stricture which occurred.  HEENT: Within normal limits. Kennon Portela present. Neck: No masses. Supraclavicular lymph nodes: Not enlarged. Breasts: Examined in both sitting and lying position. Symmetrical without skin changes or masses. Abdomen: Soft no masses guarding or rebound. No hernias. Pelvic: External within normal limits. BUS within normal limits. Vaginal examination shows poor estrogen effect, no cystocele enterocele or rectocele. Cervix and uterus absent. Adnexa within normal limits. Rectovaginal confirmatory. Extremities within normal limits.  Assessment: #1. Pelvic pain #2. Osteoporosis #3. Atrophic vaginitis #4. History of cervical dysplasia #5. Urinary frequency and 6. Mild  menopausal symptoms  Plan: Bone density and pelvic ultrasound scheduled.

## 2012-01-22 NOTE — Patient Instructions (Signed)
Scheduled bone density and pelvic ultrasound. Give [place] the name of the medication she takes for her diarrhea.

## 2012-01-23 ENCOUNTER — Encounter: Payer: Self-pay | Admitting: *Deleted

## 2012-01-23 LAB — URINALYSIS W MICROSCOPIC + REFLEX CULTURE
Bilirubin Urine: NEGATIVE
Casts: NONE SEEN
Glucose, UA: NEGATIVE mg/dL
Hgb urine dipstick: NEGATIVE
Ketones, ur: NEGATIVE mg/dL
Protein, ur: NEGATIVE mg/dL

## 2012-01-23 NOTE — Progress Notes (Signed)
Patient ID: [name] [name], female   DOB: 1946/09/10, 66 y.o.   MRN: [id number] Pt called back to tell office the name of medication that Dr.G wanted to know, left message for pt to call.

## 2012-01-29 ENCOUNTER — Ambulatory Visit (INDEPENDENT_AMBULATORY_CARE_PROVIDER_SITE_OTHER): Payer: Medicare Other | Admitting: Obstetrics and Gynecology

## 2012-01-29 ENCOUNTER — Ambulatory Visit (INDEPENDENT_AMBULATORY_CARE_PROVIDER_SITE_OTHER): Payer: Medicare Other

## 2012-01-29 DIAGNOSIS — R102 Pelvic and perineal pain: Secondary | ICD-10-CM

## 2012-01-29 DIAGNOSIS — N83339 Acquired atrophy of ovary and fallopian tube, unspecified side: Secondary | ICD-10-CM

## 2012-01-29 DIAGNOSIS — R140 Abdominal distension (gaseous): Secondary | ICD-10-CM

## 2012-01-29 DIAGNOSIS — M810 Age-related osteoporosis without current pathological fracture: Secondary | ICD-10-CM

## 2012-01-29 DIAGNOSIS — N949 Unspecified condition associated with female genital organs and menstrual cycle: Secondary | ICD-10-CM

## 2012-01-29 DIAGNOSIS — R141 Gas pain: Secondary | ICD-10-CM

## 2012-01-29 NOTE — Progress Notes (Signed)
Patient came back today for pelvic ultrasound because of abdominal discomfort, bloating, and sensation that her stomach is getting bigger. She is being treated by Dr. Loreta Ave for lymphocytic colitis. On ultrasound today her uterus is surgically absent. Both ovaries can be seen and are atrophic. By history her left ovary was removed at the time of hysterectomy but  we can clearly see it today. Her cul-de-sac is free of fluid. There are no masses in the pelvis.  Assessment: Pelvic pain with bloating.  Plan: Reassured the patient about her ovaries. I told her that either Dr. Rosalia Hammers and her and this communicated where she had an ovarian remnant but she clearly has a left ovary. She understands. I believe the source of her symptoms is gastrointestinal. She will make an appointment to see Dr. Loreta Ave.

## 2012-02-10 ENCOUNTER — Ambulatory Visit
Admission: RE | Admit: 2012-02-10 | Discharge: 2012-02-10 | Disposition: A | Discharge status: Home / Self Care | Payer: Medicare Other | Source: Ambulatory Visit | Attending: Gastroenterology | Admitting: Gastroenterology

## 2012-02-10 ENCOUNTER — Other Ambulatory Visit: Payer: Self-pay | Admitting: Gastroenterology

## 2012-02-10 DIAGNOSIS — R110 Nausea: Secondary | ICD-10-CM

## 2012-02-26 ENCOUNTER — Ambulatory Visit: Payer: Medicare Other | Admitting: Obstetrics and Gynecology

## 2012-08-02 ENCOUNTER — Other Ambulatory Visit (HOSPITAL_COMMUNITY): Payer: Self-pay | Admitting: Internal Medicine

## 2012-08-02 ENCOUNTER — Ambulatory Visit (HOSPITAL_COMMUNITY)
Admission: RE | Admit: 2012-08-02 | Discharge: 2012-08-02 | Disposition: A | Discharge status: Home / Self Care | Payer: Medicare Other | Source: Ambulatory Visit | Attending: Internal Medicine | Admitting: Internal Medicine

## 2012-08-02 DIAGNOSIS — R062 Wheezing: Secondary | ICD-10-CM

## 2012-08-02 DIAGNOSIS — R05 Cough: Secondary | ICD-10-CM

## 2012-08-02 DIAGNOSIS — J40 Bronchitis, not specified as acute or chronic: Secondary | ICD-10-CM

## 2012-08-02 DIAGNOSIS — M4120 Other idiopathic scoliosis, site unspecified: Secondary | ICD-10-CM | POA: Insufficient documentation

## 2012-08-02 DIAGNOSIS — I10 Essential (primary) hypertension: Secondary | ICD-10-CM | POA: Insufficient documentation

## 2012-08-02 DIAGNOSIS — R059 Cough, unspecified: Secondary | ICD-10-CM

## 2012-09-28 ENCOUNTER — Other Ambulatory Visit: Payer: Self-pay | Admitting: Internal Medicine

## 2012-09-28 ENCOUNTER — Other Ambulatory Visit (HOSPITAL_COMMUNITY): Payer: Self-pay | Admitting: Internal Medicine

## 2012-09-28 DIAGNOSIS — Z1231 Encounter for screening mammogram for malignant neoplasm of breast: Secondary | ICD-10-CM

## 2012-10-15 ENCOUNTER — Ambulatory Visit (HOSPITAL_COMMUNITY): Payer: Medicare Other

## 2012-12-03 ENCOUNTER — Other Ambulatory Visit (HOSPITAL_COMMUNITY): Payer: Self-pay | Admitting: Internal Medicine

## 2012-12-03 ENCOUNTER — Ambulatory Visit (HOSPITAL_COMMUNITY)
Admission: RE | Admit: 2012-12-03 | Discharge: 2012-12-03 | Disposition: A | Discharge status: Home / Self Care | Payer: Medicare Other | Source: Ambulatory Visit | Attending: Internal Medicine | Admitting: Internal Medicine

## 2012-12-03 DIAGNOSIS — I10 Essential (primary) hypertension: Secondary | ICD-10-CM | POA: Insufficient documentation

## 2012-12-03 DIAGNOSIS — Z1231 Encounter for screening mammogram for malignant neoplasm of breast: Secondary | ICD-10-CM

## 2012-12-03 DIAGNOSIS — J449 Chronic obstructive pulmonary disease, unspecified: Secondary | ICD-10-CM | POA: Insufficient documentation

## 2012-12-03 DIAGNOSIS — J4489 Other specified chronic obstructive pulmonary disease: Secondary | ICD-10-CM | POA: Insufficient documentation

## 2012-12-03 DIAGNOSIS — M4120 Other idiopathic scoliosis, site unspecified: Secondary | ICD-10-CM | POA: Insufficient documentation

## 2012-12-28 ENCOUNTER — Other Ambulatory Visit: Payer: Self-pay | Admitting: Physician Assistant

## 2012-12-28 DIAGNOSIS — G8929 Other chronic pain: Secondary | ICD-10-CM

## 2012-12-28 DIAGNOSIS — H539 Unspecified visual disturbance: Secondary | ICD-10-CM

## 2012-12-30 ENCOUNTER — Ambulatory Visit
Admission: RE | Admit: 2012-12-30 | Discharge: 2012-12-30 | Disposition: A | Discharge status: Home / Self Care | Payer: Medicare Other | Source: Ambulatory Visit | Attending: Physician Assistant | Admitting: Physician Assistant

## 2012-12-30 ENCOUNTER — Other Ambulatory Visit: Payer: Self-pay | Admitting: Internal Medicine

## 2012-12-30 ENCOUNTER — Other Ambulatory Visit: Payer: Self-pay | Admitting: Physician Assistant

## 2012-12-30 DIAGNOSIS — H539 Unspecified visual disturbance: Secondary | ICD-10-CM

## 2012-12-30 DIAGNOSIS — G8929 Other chronic pain: Secondary | ICD-10-CM

## 2013-01-02 ENCOUNTER — Ambulatory Visit
Admission: RE | Admit: 2013-01-02 | Discharge: 2013-01-02 | Disposition: A | Discharge status: Home / Self Care | Payer: Medicare Other | Source: Ambulatory Visit | Attending: Physician Assistant | Admitting: Physician Assistant

## 2013-01-02 DIAGNOSIS — G8929 Other chronic pain: Secondary | ICD-10-CM

## 2013-01-02 DIAGNOSIS — H539 Unspecified visual disturbance: Secondary | ICD-10-CM

## 2013-06-03 ENCOUNTER — Encounter: Payer: Self-pay | Admitting: Gynecology

## 2013-06-03 ENCOUNTER — Ambulatory Visit (INDEPENDENT_AMBULATORY_CARE_PROVIDER_SITE_OTHER): Payer: Medicare Other | Admitting: Gynecology

## 2013-06-03 VITALS — BP 124/78 | Ht 65.0 in | Wt 174.0 lb

## 2013-06-03 DIAGNOSIS — M810 Age-related osteoporosis without current pathological fracture: Secondary | ICD-10-CM

## 2013-06-03 DIAGNOSIS — N8111 Cystocele, midline: Secondary | ICD-10-CM

## 2013-06-03 DIAGNOSIS — N952 Postmenopausal atrophic vaginitis: Secondary | ICD-10-CM

## 2013-06-03 NOTE — Progress Notes (Signed)
[name] [name] Feb 24, 1946 [id number]        67 y.o.  G1P1001 for followup exam.  Former patient of Dr. Eda Paschal. Several issues noted below.  Past medical history,surgical history, medications, allergies, family history and social history were all reviewed and documented in the EPIC chart.  ROS:  Performed and pertinent positives and negatives are included in the history, assessment and plan .  Exam: Kim assistant Filed Vitals:   06/03/13 1359  BP: 124/78  Height: 5\' 5"  (1.651 m)  Weight: 174 lb (78.926 kg)   General appearance  Normal Skin grossly normal Head/Neck normal with no cervical or supraclavicular adenopathy thyroid normal Lungs  clear Cardiac RR, without RMG Abdominal  soft, nontender, without masses, organomegaly or hernia Breasts  examined lying and sitting without masses, retractions, discharge or axillary adenopathy. Pelvic  Ext/BUS/vagina  general atrophic changes with first degree cystocele. Cuff well supported without significant rectocele   Adnexa  Without masses or tenderness    Anus and perineum  normal   Rectovaginal  normal sphincter tone without palpated masses or tenderness.    Assessment/Plan:  67 y.o. G4P1001 female for followup exam.   1. Postmenopausal status post TAH LSO. Doing well without significant symptoms of hot flashes night sweats vaginal dryness. We'll continue to monitor. 2. Atrophic changes/first degree cystocele. Patient is asymptomatic and we'll continue to monitor. 3. Osteoporosis. DEXA 01/2012 T score -3.0. Showed loss from her prior study. Was discussed with Dr. Eda Paschal treatment but never followed do so. Apparently had been on transient oral bisphosphate before but could not tolerate it. I reviewed the risks of fracture particularly as she ages my strong recommendation she consider treatment. Options to include bisphosphates and Prolia discussed. Risks to include osteonecrosis of the jaw, atypical fractures esophageal/gastric,  rashes and increased infections all reviewed. Has difficulty swallowing and is actively followed by Dr. Loreta Ave with esophageal dilatation. I think given the total picture and a failed trial of oral previously that will go to Prolia she is interested in doing this. We'll go ahead pre-certify her and initiate treatment. Increase calcium vitamin D reviewed. 4. Mammography 11/2012. Continue with annual mammography. SBE monthly reviewed. 5. Pap smear 2013. No Pap smear done today. History of abnormal Pap smear a number of years ago with normal Pap smears since. Less frequent screening options versus stop screening altogether as she is over the age of 5 and status post hysterectomy was reviewed. We'll readdress on annual basis. 6. Colonoscopy 2013. Repeat at their recommended interval. 7. Health maintenance. No lab work done as it is all done through her primary physician's office. Followup one year, sooner as needed.  Note: This document was prepared with digital dictation and possible smart phrase technology. Any transcriptional errors that result from this process are unintentional.   Dara Lords MD, 2:41 PM 06/03/2013

## 2013-06-03 NOTE — Patient Instructions (Signed)
Office will contact you to arrange the Prolia.

## 2013-06-04 LAB — URINALYSIS W MICROSCOPIC + REFLEX CULTURE
Casts: NONE SEEN
Crystals: NONE SEEN
Glucose, UA: NEGATIVE mg/dL
pH: 6 (ref 5.0–8.0)

## 2013-06-06 ENCOUNTER — Other Ambulatory Visit: Payer: Self-pay | Admitting: Gynecology

## 2013-06-06 MED ORDER — CIPROFLOXACIN HCL 250 MG PO TABS: 250.0000 mg | ORAL_TABLET | Freq: Two times a day (BID) | ORAL | Status: DC

## 2013-06-30 ENCOUNTER — Telehealth: Payer: Self-pay | Admitting: *Deleted

## 2013-06-30 NOTE — Telephone Encounter (Signed)
LM for pt to call back regarding Prolia KW

## 2013-08-01 NOTE — Telephone Encounter (Signed)
Letter mailed due to no return call about Prolia. KW

## 2013-08-10 ENCOUNTER — Telehealth: Payer: Self-pay | Admitting: *Deleted

## 2013-08-10 NOTE — Telephone Encounter (Signed)
Pt called back after letter mailed to patient regarding Prolia. Insurance does cover it 80/20%. Pt responsible for 20% ~$400.  Pt said she will call me back. KW

## 2013-08-11 ENCOUNTER — Encounter: Payer: Self-pay | Admitting: Internal Medicine

## 2013-08-19 ENCOUNTER — Ambulatory Visit: Payer: Self-pay | Admitting: Internal Medicine

## 2013-08-22 ENCOUNTER — Encounter: Payer: Self-pay | Admitting: Emergency Medicine

## 2013-08-22 ENCOUNTER — Ambulatory Visit: Payer: Medicare Other | Admitting: Emergency Medicine

## 2013-08-22 VITALS — BP 122/80 | HR 80 | Temp 97.8°F | Resp 18 | Ht 65.25 in | Wt 171.0 lb

## 2013-08-22 DIAGNOSIS — J019 Acute sinusitis, unspecified: Secondary | ICD-10-CM

## 2013-08-22 DIAGNOSIS — R05 Cough: Secondary | ICD-10-CM

## 2013-08-22 DIAGNOSIS — E559 Vitamin D deficiency, unspecified: Secondary | ICD-10-CM

## 2013-08-22 DIAGNOSIS — E782 Mixed hyperlipidemia: Secondary | ICD-10-CM

## 2013-08-22 DIAGNOSIS — R51 Headache: Secondary | ICD-10-CM

## 2013-08-22 DIAGNOSIS — R059 Cough, unspecified: Secondary | ICD-10-CM

## 2013-08-22 DIAGNOSIS — E039 Hypothyroidism, unspecified: Secondary | ICD-10-CM

## 2013-08-22 DIAGNOSIS — I10 Essential (primary) hypertension: Secondary | ICD-10-CM

## 2013-08-22 LAB — CBC WITH DIFFERENTIAL/PLATELET
Basophils Relative: 0 % (ref 0–1)
Eosinophils Absolute: 1.2 10*3/uL — ABNORMAL HIGH (ref 0.0–0.7)
HCT: 37.5 % (ref 36.0–46.0)
Hemoglobin: 13 g/dL (ref 12.0–15.0)
MCH: 30.9 pg (ref 26.0–34.0)
MCHC: 34.7 g/dL (ref 30.0–36.0)
Monocytes Absolute: 0.6 10*3/uL (ref 0.1–1.0)
Monocytes Relative: 9 % (ref 3–12)
Neutro Abs: 3.2 10*3/uL (ref 1.7–7.7)

## 2013-08-22 MED ORDER — BENZONATATE 100 MG PO CAPS: 100.0000 mg | ORAL_CAPSULE | Freq: Three times a day (TID) | ORAL | Status: DC | PRN

## 2013-08-22 MED ORDER — MONTELUKAST SODIUM 10 MG PO TABS: 10.0000 mg | ORAL_TABLET | Freq: Every day | ORAL | Status: DC

## 2013-08-22 MED ORDER — DEXAMETHASONE SODIUM PHOSPHATE 100 MG/10ML IJ SOLN
10.0000 mg | Freq: Once | INTRAMUSCULAR | Status: AC
  Administered 2013-08-22: 10 mg via INTRAMUSCULAR

## 2013-08-22 MED ORDER — CEFTRIAXONE SODIUM 500 MG IJ SOLR
500.0000 mg | Freq: Once | INTRAMUSCULAR | Status: AC
  Administered 2013-08-22: 500 mg via INTRAMUSCULAR

## 2013-08-22 MED ORDER — CEFTRIAXONE SODIUM 500 MG IJ SOLR
500.0000 mg | Freq: Once | INTRAMUSCULAR | Status: AC
Start: 2013-08-22 — End: 2013-08-22
  Administered 2013-08-22: 500 mg via INTRAMUSCULAR

## 2013-08-22 MED ORDER — PREDNISONE 10 MG PO TABS: ORAL_TABLET | ORAL | Status: DC

## 2013-08-22 MED ORDER — CEFTRIAXONE SODIUM 1 G IJ SOLR: 1.0000 g | Freq: Once | INTRAMUSCULAR | Status: DC

## 2013-08-22 MED ORDER — DEXAMETHASONE SODIUM PHOSPHATE 100 MG/10ML IJ SOLN: 10.0000 mg | Freq: Once | INTRAMUSCULAR | Status: DC

## 2013-08-22 MED ORDER — DEXAMETHASONE SODIUM PHOSPHATE 10 MG/ML IJ SOLN: 10.0000 mg | Freq: Once | INTRAMUSCULAR | Status: DC

## 2013-08-22 NOTE — Patient Instructions (Signed)
Hypercholesterolemia High Blood Cholesterol Cholesterol is a white, waxy, fat-like protein needed by your body in small amounts. The liver makes all the cholesterol you need. It is carried from the liver by the blood through the blood vessels. Deposits (plaque) may build up on blood vessel walls. This makes the arteries narrower and stiffer. Plaque increases the risk for heart attack and stroke. You cannot feel your cholesterol level even if it is very high. The only way to know is by a blood test to check your lipid (fats) levels. Once you know your cholesterol levels, you should keep a record of the test results. Work with your caregiver to to keep your levels in the desired range. WHAT THE RESULTS MEAN:  Total cholesterol is a rough measure of all the cholesterol in your blood.  LDL is the so-called bad cholesterol. This is the type that deposits cholesterol in the walls of the arteries. You want this level to be low.  HDL is the good cholesterol because it cleans the arteries and carries the LDL away. You want this level to be high.  Triglycerides are fat that the body can either burn for energy or store. High levels are closely linked to heart disease. DESIRED LEVELS:  Total cholesterol below 200.  LDL below 100 for people at risk, below 70 for very high risk.  HDL above 50 is good, above 60 is best.  Triglycerides below 150. HOW TO LOWER YOUR CHOLESTEROL:  Diet.  Choose fish or white meat chicken and [place], roasted or baked. Limit fatty cuts of red meat, fried foods, and processed meats, such as sausage and lunch meat.  Eat lots of fresh fruits and vegetables. Choose whole grains, beans, pasta, potatoes and cereals.  Use only small amounts of olive, corn or canola oils. Avoid butter, mayonnaise, shortening or palm kernel oils. Avoid foods with trans-fats.  Use skim/nonfat milk and low-fat/nonfat yogurt and cheeses. Avoid whole milk, cream, ice cream, egg yolks and cheeses.  Healthy desserts include angel food cake, gingersnaps, animal crackers, hard candy, popsicles, and low-fat/nonfat frozen yogurt. Avoid pastries, cakes, pies and cookies.  Exercise.  A regular program helps decrease LDL and raises HDL.  Helps with weight control.  Do things that increase your activity level like gardening, walking, or taking the stairs.  Medication.  May be prescribed by your caregiver to help lowering cholesterol and the risk for heart disease.  You may need medicine even if your levels are normal if you have several risk factors. HOME CARE INSTRUCTIONS   Follow your diet and exercise programs as suggested by your caregiver.  Take medications as directed.  Have blood work done when your caregiver feels it is necessary. MAKE SURE YOU:   Understand these instructions.  Will watch your condition.  Will get help right away if you are not doing well or get worse. Document Released: 09/29/2005 Document Revised: 12/22/2011 Document Reviewed: 03/17/2007 [facility] Patient Information 2014 [place], [place]. Cerebral Aneurysm A cerebral aneurysm is the bulging or ballooning out of part of the wall of a vein or artery in the brain. Cerebral aneurysms can occur at any age. They are more common in adults than in children. They are slightly more common in women than in men. Rupture of a cerebral aneurysm results in bleeding in your brain, causing a stroke. Blood can leak into the area around your brain and develop into a blood clot within your skull. More problems can occur as a result of the aneurysm breaking, such  as:  Rebleeding.  An increase in normal brain fluid in the chambers inside your brain (hydrocephalus).  A decrease in the size of blood vessels in your brain, which starves your brain of nutrients and oxygen (vasospasm).   Pressure on your brain from bleeding. CAUSES Common causes include:   Defects present at birth (congenital).  High blood pressure.  The  buildup of fatty deposits in the arteries (atherosclerosis).  Blood vessels that develop abnormally.  Diseases that weaken and damage the walls of your blood vessels. Uncommon causes include:  Head trauma.  Infection.  Tumors.  Use of "recreational drugs" (mostly cocaine, heroin, and amphetamines). SYMPTOMS  The signs and symptoms of an unruptured cerebral aneurysm will partly depend on its size and rate of growth.  A small, unchanging aneurysm will generally produce no symptoms. A larger aneurysm that is steadily growing may produce the following symptoms:  Headache.  Neck stiffness or pain.  Loss of feeling in your face.  Problems with your vision. If an aneurysm bursts, it can be life-threatening. Symptoms may include:  A sudden and unusually severe headache.  Neck stiffness or pain.  Confusion or drowsiness.  Problems speaking.  Weakness in an arm or leg.  Nausea.  Vision impairment.  Vomiting.  Loss of consciousness. TREATMENT  Emergency treatment for a ruptured cerebral aneurysm generally includes restoring breathing and reducing pressure inside the head. Immediate emergency surgery may be needed to help prevent damage caused by hydrocephalus and to reduce the risk of rebleeding.  When aneurysms are discovered before rupture occurs, procedures to inject tiny coils of wire or to deploy a balloon may be performed on those who cannot have surgery due to the risk involved. During these procedures, a catheter is inserted through an artery to travel up to the brain. Once the catheter reaches the aneurysm, tiny balloons or coils are used to block blood flow through the aneurysm. Other treatments may include:  Bed rest.  Drug therapy.  Therapy with IV fluids to preserve circulation. SEEK IMMEDIATE MEDICAL CARE IF: You have an aneurysm and have a severe headache or any type of new neurological symptoms such as weakness, inability to talk, or visual loss. MAKE SURE  YOU:  Understand these instructions.   Will watch your condition.   Will get help right away if you are not doing well or get worse. Document Released: 06/21/2002 Document Revised: 06/01/2013 Document Reviewed: 03/17/2013 [facility] Patient Information 2014 [place], [place]. Sinus Headache A sinus headache happens when your sinuses become clogged or puffy (swollen). Sinus headaches can be mild or severe. HOME CARE  Take your medicines (antibiotics) as told. Finish them even if you start to feel better.  Only take medicine as told by your doctor.  Use a nose spray if you feel stuffed up (congested). GET HELP RIGHT AWAY IF:  You have a fever.  You have trouble seeing.  You suddenly have pain in your face or head.  You start to twitch or shake (seizure).  You are confused.  You get headaches more than once a week.  Light or sound bothers you.  You feel sick to your stomach (nauseous) or throw up (vomit).  Your headaches do not get better with treatment. MAKE SURE YOU:  Understand these instructions.  Will watch your condition.  Will get help right away if you are not doing well or get worse. Document Released: 01/29/2011 Document Revised: 12/22/2011 Document Reviewed: 01/29/2011 [facility] Patient Information 2014 [place], [place].

## 2013-08-23 ENCOUNTER — Encounter: Payer: Self-pay | Admitting: Emergency Medicine

## 2013-08-23 LAB — HEPATIC FUNCTION PANEL
ALT: 24 U/L (ref 0–35)
AST: 20 U/L (ref 0–37)
Bilirubin, Direct: 0.1 mg/dL (ref 0.0–0.3)

## 2013-08-23 LAB — BASIC METABOLIC PANEL WITH GFR
GFR, Est African American: 67 mL/min
GFR, Est Non African American: 58 mL/min — ABNORMAL LOW
Glucose, Bld: 97 mg/dL (ref 70–99)
Potassium: 3.9 mEq/L (ref 3.5–5.3)
Sodium: 138 mEq/L (ref 135–145)

## 2013-08-23 LAB — MAGNESIUM: Magnesium: 2 mg/dL (ref 1.5–2.5)

## 2013-08-23 LAB — VITAMIN D 25 HYDROXY (VIT D DEFICIENCY, FRACTURES): Vit D, 25-Hydroxy: 59 ng/mL (ref 30–89)

## 2013-08-23 LAB — LIPID PANEL
Cholesterol: 161 mg/dL (ref 0–200)
VLDL: 25 mg/dL (ref 0–40)

## 2013-08-23 LAB — TSH: TSH: 0.518 u[IU]/mL (ref 0.350–4.500)

## 2013-08-23 NOTE — Progress Notes (Signed)
Subjective:    Patient ID: [name] [name], female    DOB: [birth year], 67 y.o.   MRN: [id number]  HPI Comments: 67 yo female presents for 3 month F/U for HTN, Cholesterol, Pre-Dm, D. Deficient and with new complaint of HA. She has been eating healthier, smaller portions, and trying to lose wt. She is exercising occasionally.  BP has beed at home. Her last labs T 182 TG 130 H 45 LDL 111 D 52 A1C 5.0. Her headache has been on/ off x 3 weeks. She notes it starts in Right temple and is dull in nature. No obvious triggers and relieves on it's own after a couple of hours. Denies any factors increasing headache. She is concerned with significant sinus trouble and cerebral aneurysm history in her family but notes NEG head MRI 01/02/13 for aneurysm but + for chronic sinusitis.    Headache   Sinusitis Associated symptoms include congestion and headaches.    Current Outpatient Prescriptions on File Prior to Visit  Medication Sig Dispense Refill  . ALPRAZolam (XANAX) 0.5 MG tablet Take 0.5 mg by mouth at bedtime as needed.      Marland Kitchen AMLODIPINE BESYLATE PO Take 5 mg by mouth.       . Budesonide-Formoterol Fumarate (SYMBICORT IN) Inhale into the lungs.      . Cholecalciferol (VITAMIN D PO) Take by mouth.      . Cyanocobalamin (VITAMIN B-12 PO) Take by mouth.      . hydrochlorothiazide (HYDRODIURIL) 25 MG tablet Take 25 mg by mouth daily.      Marland Kitchen losartan (COZAAR) 100 MG tablet Take 100 mg by mouth daily.      . pantoprazole (PROTONIX) 40 MG tablet Take 40 mg by mouth daily.      . Multiple Vitamin (MULTIVITAMIN) tablet Take 1 tablet by mouth daily.       No current facility-administered medications on file prior to visit.   ALLERGIES Iohexol; Penicillins; and Sulfa antibiotics  Past Medical History  Diagnosis Date  . Lymphocytic colitis   . Cancer     Kidney cancer-Hypernephroma  . Hypertension   . Thyroid disease   . Elevated cholesterol   . Cervical dysplasia   . Anxiety   . Depression   .  COPD (chronic obstructive pulmonary disease)   . Osteoporosis    Family History  Problem Relation Age of Onset  . Hypertension Mother   . Heart disease Mother   . Asthma Mother   . Lung cancer Father   . Cancer Father     lung  . Hypertension Sister   . Cancer Sister     Stem cell cancer  . Hypertension Brother   . Stroke Brother   . Ovarian cancer Maternal Aunt   . Breast cancer Maternal Aunt     Age 68's  . Cancer Maternal Uncle     Prostate cancer  . Breast cancer Maternal Aunt     Age 60's      Review of Systems  HENT: Positive for congestion and postnasal drip.   Neurological: Positive for headaches.  All other systems reviewed and are negative.    BP 122/80  Pulse 80  Temp(Src) 97.8 F (36.6 C) (Temporal)  Resp 18  Ht 5' 5.25" (1.657 m)  Wt 171 lb (77.565 kg)  BMI 28.25 kg/m2     Objective:   Physical Exam  Nursing note and vitals reviewed. Constitutional: She is oriented to person, place, and time. She appears well-developed and well-nourished.  HENT:  Head: Normocephalic and atraumatic.  Right Ear: External ear normal.  Left Ear: External ear normal.  Nose: Nose normal.  Mouth/Throat: Oropharynx is clear and moist.  TM'S yellow  Eyes: Pupils are equal, round, and reactive to light.  Neck: Normal range of motion. Neck supple.  Cardiovascular: Normal rate, regular rhythm, normal heart sounds and intact distal pulses.   Pulmonary/Chest: Effort normal and breath sounds normal.  Abdominal: Soft. Bowel sounds are normal.  Musculoskeletal: Normal range of motion.  Neurological: She is alert and oriented to person, place, and time.  Skin: Skin is warm and dry.  Psychiatric: She has a normal mood and affect. Judgment normal.          Assessment & Plan:   3 month F/U for HTN, Cholesterol, Pre-Dm, D. Deficient check labs and continue plan AD. Headache vs chronic sinusitis Rocephin 1 gm, Dexamethasone 10mg , resart. Singulair 10 mg AD, add Tessalon  Perles 100mg  AD. Increase h2O. ER if symptoms increase. May need CT Sinus with neg MRI for aneurysm, + for Chronic sinusitis vs refer ENT

## 2013-09-27 ENCOUNTER — Ambulatory Visit (INDEPENDENT_AMBULATORY_CARE_PROVIDER_SITE_OTHER): Payer: Medicare Other | Admitting: Physician Assistant

## 2013-09-27 ENCOUNTER — Ambulatory Visit: Payer: Self-pay

## 2013-09-27 ENCOUNTER — Encounter: Payer: Self-pay | Admitting: Physician Assistant

## 2013-09-27 VITALS — BP 110/72 | HR 76 | Temp 97.9°F | Resp 16 | Ht 65.0 in | Wt 173.0 lb

## 2013-09-27 DIAGNOSIS — N3000 Acute cystitis without hematuria: Secondary | ICD-10-CM

## 2013-09-27 DIAGNOSIS — J019 Acute sinusitis, unspecified: Secondary | ICD-10-CM

## 2013-09-27 DIAGNOSIS — Z23 Encounter for immunization: Secondary | ICD-10-CM

## 2013-09-27 MED ORDER — DEXAMETHASONE SODIUM PHOSPHATE 100 MG/10ML IJ SOLN
10.0000 mg | Freq: Once | INTRAMUSCULAR | Status: AC
  Administered 2013-09-27: 10 mg via INTRAMUSCULAR

## 2013-09-27 MED ORDER — PREDNISONE 20 MG PO TABS: ORAL_TABLET | ORAL | Status: DC

## 2013-09-27 MED ORDER — LEVOFLOXACIN 500 MG PO TABS: 500.0000 mg | ORAL_TABLET | Freq: Every day | ORAL | Status: DC

## 2013-09-27 NOTE — Progress Notes (Signed)
   Subjective:    Patient ID: [name] [name], female    DOB: [birth year], 67 y.o.   MRN: [id number]  URI  The current episode started 1 to 4 weeks ago. The problem has been gradually worsening. There has been no fever. Associated symptoms include abdominal pain (suprapubic pain), congestion, coughing, dysuria, rhinorrhea, sinus pain, a sore throat and wheezing. Pertinent negatives include no chest pain, diarrhea, ear pain, headaches, joint pain, joint swelling, nausea, neck pain, plugged ear sensation, rash, sneezing, swollen glands or vomiting. She has tried acetaminophen for the symptoms. The treatment provided no relief.    Review of Systems  Constitutional: Positive for fatigue. Negative for fever and chills.  HENT: Positive for congestion, rhinorrhea and sore throat. Negative for ear pain and sneezing.   Eyes: Negative.   Respiratory: Positive for cough, shortness of breath and wheezing.   Cardiovascular: Negative for chest pain.  Gastrointestinal: Positive for abdominal pain (suprapubic pain). Negative for nausea, vomiting and diarrhea.  Genitourinary: Positive for dysuria.  Musculoskeletal: Negative.  Negative for joint pain and neck pain.  Skin: Negative.  Negative for rash.  Neurological: Negative for headaches.       Objective:   Physical Exam  Constitutional: She is oriented to person, place, and time. She appears well-developed and well-nourished.  HENT:  Head: Normocephalic and atraumatic.  Right Ear: External ear normal.  Left Ear: External ear normal.  Nose: Nose normal.  Mouth/Throat: Oropharynx is clear and moist.  Eyes: Conjunctivae are normal. Pupils are equal, round, and reactive to light.  Neck: Normal range of motion. Neck supple.  Cardiovascular: Normal rate and regular rhythm.   Pulmonary/Chest: Effort normal. No respiratory distress. She has wheezes. She has no rales. She exhibits no tenderness.  Abdominal: Soft. Bowel sounds are normal.  Lymphadenopathy:     She has no cervical adenopathy.  Neurological: She is alert and oriented to person, place, and time.  Skin: Skin is warm and dry.       Assessment & Plan:  Acute cystitis - Plan: Urine culture, Urinalysis, Routine w reflex microscopic  Acute sinusitis, unspecified - Plan: levofloxacin (LEVAQUIN) 500 MG tablet, dexamethasone (DECADRON) injection 10 mg, predniSONE (DELTASONE) 20 MG tablet

## 2013-09-27 NOTE — Patient Instructions (Signed)

## 2013-09-28 ENCOUNTER — Ambulatory Visit: Payer: Self-pay | Admitting: Physician Assistant

## 2013-09-28 LAB — URINALYSIS, ROUTINE W REFLEX MICROSCOPIC
Bilirubin Urine: NEGATIVE
Hgb urine dipstick: NEGATIVE
Ketones, ur: NEGATIVE mg/dL
Protein, ur: NEGATIVE mg/dL
Urobilinogen, UA: 0.2 mg/dL (ref 0.0–1.0)

## 2013-09-29 LAB — URINE CULTURE: Colony Count: NO GROWTH

## 2013-10-25 DIAGNOSIS — K5289 Other specified noninfective gastroenteritis and colitis: Secondary | ICD-10-CM | POA: Diagnosis not present

## 2013-10-25 DIAGNOSIS — K219 Gastro-esophageal reflux disease without esophagitis: Secondary | ICD-10-CM | POA: Diagnosis not present

## 2013-10-25 DIAGNOSIS — R1319 Other dysphagia: Secondary | ICD-10-CM | POA: Diagnosis not present

## 2013-10-28 ENCOUNTER — Encounter: Payer: Self-pay | Admitting: Internal Medicine

## 2013-10-28 ENCOUNTER — Ambulatory Visit (INDEPENDENT_AMBULATORY_CARE_PROVIDER_SITE_OTHER): Payer: Medicare Other | Admitting: Internal Medicine

## 2013-10-28 VITALS — BP 142/92 | HR 68 | Temp 98.1°F | Resp 18 | Wt 176.2 lb

## 2013-10-28 DIAGNOSIS — R7309 Other abnormal glucose: Secondary | ICD-10-CM | POA: Diagnosis not present

## 2013-10-28 DIAGNOSIS — Z79899 Other long term (current) drug therapy: Secondary | ICD-10-CM | POA: Diagnosis not present

## 2013-10-28 DIAGNOSIS — E782 Mixed hyperlipidemia: Secondary | ICD-10-CM

## 2013-10-28 DIAGNOSIS — E559 Vitamin D deficiency, unspecified: Secondary | ICD-10-CM

## 2013-10-28 DIAGNOSIS — I10 Essential (primary) hypertension: Secondary | ICD-10-CM | POA: Diagnosis not present

## 2013-10-28 LAB — CBC WITH DIFFERENTIAL/PLATELET
BASOS PCT: 1 % (ref 0–1)
Basophils Absolute: 0.1 10*3/uL (ref 0.0–0.1)
Eosinophils Absolute: 0.8 10*3/uL — ABNORMAL HIGH (ref 0.0–0.7)
Eosinophils Relative: 14 % — ABNORMAL HIGH (ref 0–5)
HCT: 38.3 % (ref 36.0–46.0)
HEMOGLOBIN: 13.1 g/dL (ref 12.0–15.0)
LYMPHS ABS: 2.1 10*3/uL (ref 0.7–4.0)
LYMPHS PCT: 36 % (ref 12–46)
MCH: 31 pg (ref 26.0–34.0)
MCHC: 34.2 g/dL (ref 30.0–36.0)
MCV: 90.8 fL (ref 78.0–100.0)
MONOS PCT: 8 % (ref 3–12)
Monocytes Absolute: 0.4 10*3/uL (ref 0.1–1.0)
NEUTROS ABS: 2.4 10*3/uL (ref 1.7–7.7)
NEUTROS PCT: 41 % — AB (ref 43–77)
Platelets: 225 10*3/uL (ref 150–400)
RBC: 4.22 MIL/uL (ref 3.87–5.11)
RDW: 13 % (ref 11.5–15.5)
WBC: 5.7 10*3/uL (ref 4.0–10.5)

## 2013-10-28 MED ORDER — MELOXICAM 15 MG PO TABS: ORAL_TABLET | ORAL | Status: DC

## 2013-10-28 MED ORDER — CLONAZEPAM 2 MG PO TABS: ORAL_TABLET | ORAL | Status: DC

## 2013-10-28 NOTE — Progress Notes (Signed)
Patient ID: [name] [name], female   DOB: 02-22-1946, 68 y.o.   MRN: [id number]   This very nice 68 y.o. MWF presents for 3 month follow up with Hypertension, Hyperlipidemia, Pre-Diabetes and Vitamin D Deficiency. She also presents for concerns of treatment of Anxiety and depression. She's been off of Prozac about 2 weeks and is haning some aching in her thighs and calves which are relieved with advil. She relates she actually feels better - not depressed and mentally clearer of the Prozac, but that she is more edgy and "snappy".   HTN predates since 1997. BP has been controlled at home. Today's BP: 142/92 mmHg. Patient denies any cardiac type chest pain, palpitations, dyspnea/orthopnea/PND, dizziness, claudication, or dependent edema.   Hyperlipidemia is controlled with diet & meds. Last Cholesterol was 182, Triglycerides were 130, HDL 45 and LDL 111 in Aug - near goal. Patient denies myalgias or other med SE's.    Also, the patient has history of PreDiabetes with last A1c of 5.0 % in Aug 2010. Patient denies any symptoms of reactive hypoglycemia, diabetic polys, paresthesias or visual blurring.   Further, Patient has history of Vitamin D Deficiency with last vitamin D of 52. Patient supplements vitamin D without any suspected side-effects.    Medication List       AMLODIPINE BESYLATE PO  Take 5 mg by mouth.     clonazePAM 2 MG tablet  Commonly known as:  KLONOPIN  1/2 to 1 tablet twice daily as need for anxiety or sleep     co-enzyme Q-10 30 MG capsule  Take 30 mg by mouth daily.     hydrochlorothiazide 25 MG tablet  Commonly known as:  HYDRODIURIL  Take 25 mg by mouth daily.     levothyroxine 88 MCG tablet  Commonly known as:  SYNTHROID, LEVOTHROID  Take 88 mcg by mouth daily before breakfast.     losartan 100 MG tablet  Commonly known as:  COZAAR  Take 100 mg by mouth daily.     meloxicam 15 MG tablet  Commonly known as:  MOBIC  1/2 to 1 tablet with for daily for pain and  inflammation     montelukast 10 MG tablet  Commonly known as:  SINGULAIR  Take 1 tablet (10 mg total) by mouth daily.     multivitamin tablet  Take 1 tablet by mouth daily.     pantoprazole 40 MG tablet  Commonly known as:  PROTONIX  Take 40 mg by mouth daily.     pravastatin 40 MG tablet  Commonly known as:  PRAVACHOL  Take 40 mg by mouth at bedtime.     sulfaSALAzine 500 MG tablet  Commonly known as:  AZULFIDINE  Take 500 mg by mouth 3 (three) times daily. Two tablets three times a day     SYMBICORT IN  Inhale into the lungs.     VITAMIN B-12 PO  Take by mouth.     VITAMIN D PO  Take by mouth.         Allergies  Allergen Reactions  . Iohexol      Code: RASH, Desc: PT STATES SHE HAS ONE KIDNEY SO NOT TO USE IV DYE/10/27/06/RM, Onset Date: [id number]   . Penicillins   . Sulfa Antibiotics Nausea Only    PMHx:   Past Medical History  Diagnosis Date  . Lymphocytic colitis   . Cancer     Kidney cancer-Hypernephroma  . Hypertension   . Thyroid disease   . Elevated  cholesterol   . Cervical dysplasia   . Anxiety   . Depression   . COPD (chronic obstructive pulmonary disease)   . Osteoporosis     FHx:    Reviewed / unchanged  SHx:    Reviewed / unchanged  Systems Review: Constitutional: Denies fever, chills, wt changes, headaches, insomnia, fatigue, night sweats, change in appetite. Eyes: Denies redness, blurred vision, diplopia, discharge, itchy, watery eyes.  ENT: Denies discharge, congestion, post nasal drip, epistaxis, sore throat, earache, hearing loss, dental pain, tinnitus, vertigo, sinus pain, snoring.  CV: Denies chest pain, palpitations, irregular heartbeat, syncope, dyspnea, diaphoresis, orthopnea, PND, claudication, edema. Respiratory: denies cough, dyspnea, DOE, pleurisy, hoarseness, laryngitis, wheezing.  Gastrointestinal: Denies dysphagia, odynophagia, heartburn, reflux, water brash, abdominal pain or cramps, nausea, vomiting, bloating,  diarrhea, constipation, hematemesis, melena, hematochezia,  or hemorrhoids. Genitourinary: Denies dysuria, frequency, urgency, nocturia, hesitancy, discharge, hematuria, flank pain. Musculoskeletal: Denies arthralgias, myalgias, stiffness, jt. swelling, pain, limp, strain/sprain.  Skin: Denies pruritus, rash, hives, warts, acne, eczema, change in skin lesion(s). Neuro: No weakness, tremor, incoordination, spasms, paresthesia, or pain. Psychiatric: Denies confusion, memory loss, or sensory loss. Endo: Denies change in weight, skin, hair change.  Heme/Lymph: No excessive bleeding, bruising, orenlarged lymph nodes.  BP: 142/92  Pulse: 68  Temp: 98.1 F (36.7 C)  Resp: 18    Estimated body mass index is 29.32 kg/(m^2) as calculated from the following:   Height as of 09/27/13: 5\' 5"  (1.651 m).   Weight as of this encounter: 176 lb 3.2 oz (79.924 kg).  On Exam: Appears well nourished - in no distress. Eyes: PERRLA, EOMs, conjunctiva no swelling or erythema. Sinuses: No frontal/maxillary tenderness ENT/Mouth: EAC's clear, TM's nl w/o erythema, bulging. Nares clear w/o erythema, swelling, exudates. Oropharynx clear without erythema or exudates. Oral hygiene is good. Tongue normal, non obstructing. Hearing intact.  Neck: Supple. Thyroid nl. Car 2+/2+ without bruits, nodes or JVD. Chest: Respirations nl with BS clear & equal w/o rales, rhonchi, wheezing or stridor.  Cor: Heart sounds normal w/ regular rate and rhythm without sig. murmurs, gallops, clicks, or rubs. Peripheral pulses normal and equal  without edema.  Abdomen: Soft & bowel sounds normal. Non-tender w/o guarding, rebound, hernias, masses, or organomegaly.  Lymphatics: Unremarkable.  Musculoskeletal: Full ROM all peripheral extremities, joint stability, 5/5 strength, and normal gait.  Skin: Warm, dry without exposed rashes, lesions, ecchymosis apparent.  Neuro: Cranial nerves intact, reflexes equal bilaterally. Sensory-motor testing  grossly intact. Tendon reflexes grossly intact.  Pysch: Alert & oriented x 3. Insight and judgement nl & appropriate. No ideations.  Assessment and Plan:  1. Hypertension - Continue monitor blood pressure at home. Continue diet/meds same.  2. Hyperlipidemia - Continue diet/meds, exercise,& lifestyle modifications. Continue monitor periodic cholesterol/liver & renal functions   3. Pre-diabetes - Continue diet, exercise, lifestyle modifications. Monitor appropriate labs.  4. Vitamin D Deficiency - Continue supplementation.  5. Hx/o RCC (2002)  Recommended regular exercise, BP monitoring, weight control, and discussed med and SE's. Recommended labs to assess and monitor clinical status. Further disposition pending results of labs. Rx Meloxicam prn aches/pains and try Clonazepam 2mg  1/2 to 1 tab bid for irritability as she prefers to not take SSRI's.

## 2013-10-28 NOTE — Patient Instructions (Signed)
Fibromyalgia Fibromyalgia is a disorder that is often misunderstood. It is associated with muscular pains and tenderness that comes and goes. It is often associated with fatigue and sleep disturbances. Though it tends to be long-lasting, fibromyalgia is not life-threatening. CAUSES  The exact cause of fibromyalgia is unknown. People with certain gene types are predisposed to developing fibromyalgia and other conditions. Certain factors can play a role as triggers, such as:  Spine disorders.  Arthritis.  Severe injury (trauma) and other physical stressors.  Emotional stressors. SYMPTOMS   The main symptom is pain and stiffness in the muscles and joints, which can vary over time.  Sleep and fatigue problems. Other related symptoms may include:  Bowel and bladder problems.  Headaches.  Visual problems.  Problems with odors and noises.  Depression or mood changes.  Painful periods (dysmenorrhea).  Dryness of the skin or eyes. DIAGNOSIS  There are no specific tests for diagnosing fibromyalgia. Patients can be diagnosed accurately from the specific symptoms they have. The diagnosis is made by determining that nothing else is causing the problems. TREATMENT  There is no cure. Management includes medicines and an active, healthy lifestyle. The goal is to enhance physical fitness, decrease pain, and improve sleep. HOME CARE INSTRUCTIONS   Only take over-the-counter or prescription medicines as directed by your caregiver. Sleeping pills, tranquilizers, and pain medicines may make your problems worse.  Low-impact aerobic exercise is very important and advised for treatment. At first, it may seem to make pain worse. Gradually increasing your tolerance will overcome this feeling.  Learning relaxation techniques and how to control stress will help you. Biofeedback, visual imagery, hypnosis, muscle relaxation, yoga, and meditation are all options.  Anti-inflammatory medicines and  physical therapy may provide short-term help.  Acupuncture or massage treatments may help.  Take muscle relaxant medicines as suggested by your caregiver.  Avoid stressful situations.  Plan a healthy lifestyle. This includes your diet, sleep, rest, exercise, and friends.  Find and practice a hobby you enjoy.  Join a fibromyalgia support group for interaction, ideas, and sharing advice. This may be helpful. SEEK MEDICAL CARE IF:  You are not having good results or improvement from your treatment. FOR MORE INFORMATION  National Fibromyalgia Association: www.fmaware.[place]: www.arthritis.org Document Released: 09/29/2005 Document Revised: 12/22/2011 Document Reviewed: 01/09/2010 [facility] Patient Information 2014 [place], [place].    Hypertension As your heart beats, it forces blood through your arteries. This force is your blood pressure. If the pressure is too high, it is called hypertension (HTN) or high blood pressure. HTN is dangerous because you may have it and not know it. High blood pressure may mean that your heart has to work harder to pump blood. Your arteries may be narrow or stiff. The extra work puts you at risk for heart disease, stroke, and other problems.  Blood pressure consists of two numbers, a higher number over a lower, 110/72, for example. It is stated as "110 over 72." The ideal is below 120 for the top number (systolic) and under 80 for the bottom (diastolic). Write down your blood pressure today. You should pay close attention to your blood pressure if you have certain conditions such as:  Heart failure.  Prior heart attack.  Diabetes  Chronic kidney disease.  Prior stroke.  Multiple risk factors for heart disease. To see if you have HTN, your blood pressure should be measured while you are seated with your arm held at the level of the heart. It should be measured at  least twice. A one-time elevated blood pressure reading (especially in  the Emergency Department) does not mean that you need treatment. There may be conditions in which the blood pressure is different between your right and left arms. It is important to see your caregiver soon for a recheck. Most people have essential hypertension which means that there is not a specific cause. This type of high blood pressure may be lowered by changing lifestyle factors such as:  Stress.  Smoking.  Lack of exercise.  Excessive weight.  Drug/tobacco/alcohol use.  Eating less salt. Most people do not have symptoms from high blood pressure until it has caused damage to the body. Effective treatment can often prevent, delay or reduce that damage. TREATMENT  When a cause has been identified, treatment for high blood pressure is directed at the cause. There are a large number of medications to treat HTN. These fall into several categories, and your caregiver will help you select the medicines that are best for you. Medications may have side effects. You should review side effects with your caregiver. If your blood pressure stays high after you have made lifestyle changes or started on medicines,   Your medication(s) may need to be changed.  Other problems may need to be addressed.  Be certain you understand your prescriptions, and know how and when to take your medicine.  Be sure to follow up with your caregiver within the time frame advised (usually within two weeks) to have your blood pressure rechecked and to review your medications.  If you are taking more than one medicine to lower your blood pressure, make sure you know how and at what times they should be taken. Taking two medicines at the same time can result in blood pressure that is too low. SEEK IMMEDIATE MEDICAL CARE IF:  You develop a severe headache, blurred or changing vision, or confusion.  You have unusual weakness or numbness, or a faint feeling.  You have severe chest or abdominal pain, vomiting, or  breathing problems. MAKE SURE YOU:   Understand these instructions.  Will watch your condition.  Will get help right away if you are not doing well or get worse.   Diabetes and Exercise Exercising regularly is important. It is not just about losing weight. It has many health benefits, such as:  Improving your overall fitness, flexibility, and endurance.  Increasing your bone density.  Helping with weight control.  Decreasing your body fat.  Increasing your muscle strength.  Reducing stress and tension.  Improving your overall health. People with diabetes who exercise gain additional benefits because exercise:  Reduces appetite.  Improves the body's use of blood sugar (glucose).  Helps lower or control blood glucose.  Decreases blood pressure.  Helps control blood lipids (such as cholesterol and triglycerides).  Improves the body's use of the hormone insulin by:  Increasing the body's insulin sensitivity.  Reducing the body's insulin needs.  Decreases the risk for heart disease because exercising:  Lowers cholesterol and triglycerides levels.  Increases the levels of good cholesterol (such as high-density lipoproteins [HDL]) in the body.  Lowers blood glucose levels. YOUR ACTIVITY PLAN  Choose an activity that you enjoy and set realistic goals. Your health care provider or diabetes educator can help you make an activity plan that works for you. You can break activities into 2 or 3 sessions throughout the day. Doing so is as good as one long session. Exercise ideas include:  Taking the dog for a walk.  Taking  the stairs instead of the elevator.  Dancing to your favorite song.  Doing your favorite exercise with a friend. RECOMMENDATIONS FOR EXERCISING WITH TYPE 1 OR TYPE 2 DIABETES   Check your blood glucose before exercising. If blood glucose levels are greater than 240 mg/dL, check for urine ketones. Do not exercise if ketones are present.  Avoid  injecting insulin into areas of the body that are going to be exercised. For example, avoid injecting insulin into:  The arms when playing tennis.  The legs when jogging.  Keep a record of:  Food intake before and after you exercise.  Expected peak times of insulin action.  Blood glucose levels before and after you exercise.  The type and amount of exercise you have done.  Review your records with your health care provider. Your health care provider will help you to develop guidelines for adjusting food intake and insulin amounts before and after exercising.  If you take insulin or oral hypoglycemic agents, watch for signs and symptoms of hypoglycemia. They include:  Dizziness.  Shaking.  Sweating.  Chills.  Confusion.  Drink plenty of water while you exercise to prevent dehydration or heat stroke. Body water is lost during exercise and must be replaced.  Talk to your health care provider before starting an exercise program to make sure it is safe for you. Remember, almost any type of activity is better than none.    Cholesterol Cholesterol is a white, waxy, fat-like protein needed by your body in small amounts. The liver makes all the cholesterol you need. It is carried from the liver by the blood through the blood vessels. Deposits (plaque) may build up on blood vessel walls. This makes the arteries narrower and stiffer. Plaque increases the risk for heart attack and stroke. You cannot feel your cholesterol level even if it is very high. The only way to know is by a blood test to check your lipid (fats) levels. Once you know your cholesterol levels, you should keep a record of the test results. Work with your caregiver to to keep your levels in the desired range. WHAT THE RESULTS MEAN:  Total cholesterol is a rough measure of all the cholesterol in your blood.  LDL is the so-called bad cholesterol. This is the type that deposits cholesterol in the walls of the arteries.  You want this level to be low.  HDL is the good cholesterol because it cleans the arteries and carries the LDL away. You want this level to be high.  Triglycerides are fat that the body can either burn for energy or store. High levels are closely linked to heart disease. DESIRED LEVELS:  Total cholesterol below 200.  LDL below 100 for people at risk, below 70 for very high risk.  HDL above 50 is good, above 60 is best.  Triglycerides below 150. HOW TO LOWER YOUR CHOLESTEROL:  Diet.  Choose fish or white meat chicken and [place], roasted or baked. Limit fatty cuts of red meat, fried foods, and processed meats, such as sausage and lunch meat.  Eat lots of fresh fruits and vegetables. Choose whole grains, beans, pasta, potatoes and cereals.  Use only small amounts of olive, corn or canola oils. Avoid butter, mayonnaise, shortening or palm kernel oils. Avoid foods with trans-fats.  Use skim/nonfat milk and low-fat/nonfat yogurt and cheeses. Avoid whole milk, cream, ice cream, egg yolks and cheeses. Healthy desserts include angel food cake, ginger snaps, animal crackers, hard candy, popsicles, and low-fat/nonfat frozen yogurt. Avoid  pastries, cakes, pies and cookies.  Exercise.  A regular program helps decrease LDL and raises HDL.  Helps with weight control.  Do things that increase your activity level like gardening, walking, or taking the stairs.  Medication.  May be prescribed by your caregiver to help lowering cholesterol and the risk for heart disease.  You may need medicine even if your levels are normal if you have several risk factors. HOME CARE INSTRUCTIONS   Follow your diet and exercise programs as suggested by your caregiver.  Take medications as directed.  Have blood work done when your caregiver feels it is necessary. MAKE SURE YOU:   Understand these instructions.  Will watch your condition.  Will get help right away if you are not doing well or get  worse.      Vitamin D Deficiency Vitamin D is an important vitamin that your body needs. Having too little of it in your body is called a deficiency. A very bad deficiency can make your bones soft and can cause a condition called rickets.  Vitamin D is important to your body for different reasons, such as:   It helps your body absorb 2 minerals called calcium and phosphorus.  It helps make your bones healthy.  It may prevent some diseases, such as diabetes and multiple sclerosis.  It helps your muscles and heart. You can get vitamin D in several ways. It is a natural part of some foods. The vitamin is also added to some dairy products and cereals. Some people take vitamin D supplements. Also, your body makes vitamin D when you are in the sun. It changes the sun's rays into a form of the vitamin that your body can use. CAUSES   Not eating enough foods that contain vitamin D.  Not getting enough sunlight.  Having certain digestive system diseases that make it hard to absorb vitamin D. These diseases include Crohn's disease, chronic pancreatitis, and cystic fibrosis.  Having a surgery in which part of the stomach or small intestine is removed.  Being obese. Fat cells pull vitamin D out of your blood. That means that obese people may not have enough vitamin D left in their blood and in other body tissues.  Having chronic kidney or liver disease. RISK FACTORS Risk factors are things that make you more likely to develop a vitamin D deficiency. They include:  Being older.  Not being able to get outside very much.  Living in a nursing home.  Having had broken bones.  Having weak or thin bones (osteoporosis).  Having a disease or condition that changes how your body absorbs vitamin D.  Having dark skin.  Some medicines such as seizure medicines or steroids.  Being overweight or obese. SYMPTOMS Mild cases of vitamin D deficiency may not have any symptoms. If you have a very  bad case, symptoms may include:  Bone pain.  Muscle pain.  Falling often.  Broken bones caused by a minor injury, due to osteoporosis. DIAGNOSIS A blood test is the best way to tell if you have a vitamin D deficiency. TREATMENT Vitamin D deficiency can be treated in different ways. Treatment for vitamin D deficiency depends on what is causing it. Options include:  Taking vitamin D supplements.  Taking a calcium supplement. Your caregiver will suggest what dose is best for you. HOME CARE INSTRUCTIONS  Take any supplements that your caregiver prescribes. Follow the directions carefully. Take only the suggested amount.  Have your blood tested 2 months after  you start taking supplements.  Eat foods that contain vitamin D. Healthy choices include:  Fortified dairy products, cereals, or juices. Fortified means vitamin D has been added to the food. Check the label on the package to be sure.  Fatty fish like salmon or trout.  Eggs.  Oysters.  Do not use a tanning bed.  Keep your weight at a healthy level. Lose weight if you need to.  Keep all follow-up appointments. Your caregiver will need to perform blood tests to make sure your vitamin D deficiency is going away. SEEK MEDICAL CARE IF:  You have any questions about your treatment.  You continue to have symptoms of vitamin D deficiency.  You have nausea or vomiting.  You are constipated.  You feel confused.  You have severe abdominal or back pain. MAKE SURE YOU:  Understand these instructions.  Will watch your condition.  Will get help right away if you are not doing well or get worse.

## 2013-10-29 LAB — VITAMIN D 25 HYDROXY (VIT D DEFICIENCY, FRACTURES): Vit D, 25-Hydroxy: 57 ng/mL (ref 30–89)

## 2013-10-29 LAB — BASIC METABOLIC PANEL WITH GFR
BUN: 19 mg/dL (ref 6–23)
CHLORIDE: 103 meq/L (ref 96–112)
CO2: 30 mEq/L (ref 19–32)
Calcium: 9.1 mg/dL (ref 8.4–10.5)
Creat: 0.97 mg/dL (ref 0.50–1.10)
GFR, EST AFRICAN AMERICAN: 70 mL/min
GFR, Est Non African American: 61 mL/min
GLUCOSE: 84 mg/dL (ref 70–99)
POTASSIUM: 4.5 meq/L (ref 3.5–5.3)
SODIUM: 137 meq/L (ref 135–145)

## 2013-10-29 LAB — HEMOGLOBIN A1C
Hgb A1c MFr Bld: 5.3 % (ref <5.7)
Mean Plasma Glucose: 105 mg/dL (ref <117)

## 2013-10-29 LAB — LIPID PANEL
CHOLESTEROL: 177 mg/dL (ref 0–200)
HDL: 53 mg/dL (ref >39)
LDL CALC: 103 mg/dL — AB (ref 0–99)
Total CHOL/HDL Ratio: 3.3 Ratio
Triglycerides: 106 mg/dL (ref <150)
VLDL: 21 mg/dL (ref 0–40)

## 2013-10-29 LAB — HEPATIC FUNCTION PANEL
ALK PHOS: 54 U/L (ref 39–117)
ALT: 23 U/L (ref 0–35)
AST: 22 U/L (ref 0–37)
Albumin: 4.2 g/dL (ref 3.5–5.2)
BILIRUBIN INDIRECT: 0.5 mg/dL (ref 0.0–0.9)
Bilirubin, Direct: 0.1 mg/dL (ref 0.0–0.3)
Total Bilirubin: 0.6 mg/dL (ref 0.3–1.2)
Total Protein: 6.3 g/dL (ref 6.0–8.3)

## 2013-10-29 LAB — INSULIN, FASTING: INSULIN FASTING, SERUM: 14 u[IU]/mL (ref 3–28)

## 2013-10-29 LAB — TSH: TSH: 3.077 u[IU]/mL (ref 0.350–4.500)

## 2013-10-29 LAB — MAGNESIUM: Magnesium: 2.1 mg/dL (ref 1.5–2.5)

## 2013-10-31 ENCOUNTER — Telehealth: Payer: Self-pay | Admitting: *Deleted

## 2013-10-31 NOTE — Telephone Encounter (Signed)
Message copied by Emelda Brothers on Mon Oct 31, 2013 12:21 PM ------      Message from: Unk Pinto      Created: Sun Oct 30, 2013 11:39 PM       [facility] kidneys liver thyroid mag - all nl/ok       Chol 177 hdl 53 both great - keep up the great work       All else ok ------

## 2013-11-22 ENCOUNTER — Other Ambulatory Visit: Payer: Self-pay | Admitting: Gastroenterology

## 2013-11-22 DIAGNOSIS — R1319 Other dysphagia: Secondary | ICD-10-CM | POA: Diagnosis not present

## 2013-11-22 DIAGNOSIS — K5289 Other specified noninfective gastroenteritis and colitis: Secondary | ICD-10-CM | POA: Diagnosis not present

## 2013-11-22 DIAGNOSIS — K219 Gastro-esophageal reflux disease without esophagitis: Secondary | ICD-10-CM | POA: Diagnosis not present

## 2013-11-22 DIAGNOSIS — R1310 Dysphagia, unspecified: Secondary | ICD-10-CM

## 2013-11-24 DIAGNOSIS — L821 Other seborrheic keratosis: Secondary | ICD-10-CM | POA: Diagnosis not present

## 2013-11-24 DIAGNOSIS — D2360 Other benign neoplasm of skin of unspecified upper limb, including shoulder: Secondary | ICD-10-CM | POA: Diagnosis not present

## 2013-11-28 ENCOUNTER — Ambulatory Visit
Admission: RE | Admit: 2013-11-28 | Discharge: 2013-11-28 | Disposition: A | Discharge status: Home / Self Care | Payer: Medicare Other | Source: Ambulatory Visit | Attending: Gastroenterology | Admitting: Gastroenterology

## 2013-11-28 DIAGNOSIS — K449 Diaphragmatic hernia without obstruction or gangrene: Secondary | ICD-10-CM | POA: Diagnosis not present

## 2013-11-28 DIAGNOSIS — R1310 Dysphagia, unspecified: Secondary | ICD-10-CM

## 2013-11-28 DIAGNOSIS — K225 Diverticulum of esophagus, acquired: Secondary | ICD-10-CM | POA: Diagnosis not present

## 2013-11-29 ENCOUNTER — Ambulatory Visit: Payer: Self-pay | Admitting: Internal Medicine

## 2013-12-13 ENCOUNTER — Encounter: Payer: Self-pay | Admitting: Internal Medicine

## 2013-12-13 ENCOUNTER — Ambulatory Visit (INDEPENDENT_AMBULATORY_CARE_PROVIDER_SITE_OTHER): Payer: Medicare Other | Admitting: Internal Medicine

## 2013-12-13 VITALS — BP 126/78 | HR 68 | Temp 97.9°F | Resp 16 | Ht 65.25 in | Wt 179.2 lb

## 2013-12-13 DIAGNOSIS — K5289 Other specified noninfective gastroenteritis and colitis: Secondary | ICD-10-CM | POA: Diagnosis not present

## 2013-12-13 DIAGNOSIS — E039 Hypothyroidism, unspecified: Secondary | ICD-10-CM | POA: Diagnosis not present

## 2013-12-13 DIAGNOSIS — R109 Unspecified abdominal pain: Secondary | ICD-10-CM | POA: Diagnosis not present

## 2013-12-13 DIAGNOSIS — Z79899 Other long term (current) drug therapy: Secondary | ICD-10-CM | POA: Insufficient documentation

## 2013-12-13 DIAGNOSIS — K52832 Lymphocytic colitis: Secondary | ICD-10-CM

## 2013-12-13 MED ORDER — DICYCLOMINE HCL 20 MG PO TABS: 20.0000 mg | ORAL_TABLET | Freq: Three times a day (TID) | ORAL | Status: DC | PRN

## 2013-12-13 NOTE — Progress Notes (Signed)
Subjective:    Patient ID: [name] [name], female    DOB: 01/10/1946, 68 y.o.   MRN: [id number]  HPI Pt has Hx/o Lymphocytic colitis and recently saw Dr Collene Mares with c/o mild dysphagia and hoarseness and had a barium swallow done which was non-revealing. She also had a negative CTs of the Abd and Head by Dr Collene Mares. Today she c/o Bilat and generalized Abdominal Cramping and bloating w/o sig Nausea. She did have a couple of days of diarrhea about 10 days ago which resolved. She has not noticed any particular association to foods. She continues on her Azulfadine from Dr Collene Mares.   Medication List       AMLODIPINE BESYLATE PO  Take 5 mg by mouth.     clonazePAM 2 MG tablet  Commonly known as:  KLONOPIN  1/2 to 1 tablet twice daily as need for anxiety or sleep     co-enzyme Q-10 30 MG capsule  Take 30 mg by mouth daily.     dicyclomine 20 MG tablet  Commonly known as:  BENTYL  Take 1 tablet (20 mg total) by mouth 3 (three) times daily as needed for spasms.     hydrochlorothiazide 25 MG tablet  Commonly known as:  HYDRODIURIL  Take 25 mg by mouth daily.     levothyroxine 88 MCG tablet  Commonly known as:  SYNTHROID, LEVOTHROID  Take 88 mcg by mouth daily before breakfast.     losartan 100 MG tablet  Commonly known as:  COZAAR  Take 100 mg by mouth daily.     meloxicam 15 MG tablet  Commonly known as:  MOBIC  1/2 to 1 tablet with for daily for pain and inflammation     montelukast 10 MG tablet  Commonly known as:  SINGULAIR  Take 1 tablet (10 mg total) by mouth daily.     multivitamin tablet  Take 1 tablet by mouth daily.     pantoprazole 40 MG tablet  Commonly known as:  PROTONIX  Take 40 mg by mouth daily.     pravastatin 40 MG tablet  Commonly known as:  PRAVACHOL  Take 40 mg by mouth at bedtime.     sulfaSALAzine 500 MG tablet  Commonly known as:  AZULFIDINE  Take 500 mg by mouth 3 (three) times daily. Two tablets three times a day     SYMBICORT IN  Inhale into the  lungs.     VITAMIN B-12 PO  Take by mouth.     VITAMIN D PO  Take by mouth.       Allergies  Allergen Reactions  . Iohexol      Code: RASH, Desc: PT STATES SHE HAS ONE KIDNEY SO NOT TO USE IV DYE/10/27/06/RM, Onset Date: [id number]   . Penicillins   . Sulfa Antibiotics Nausea Only   Past Medical History  Diagnosis Date  . Lymphocytic colitis   . Cancer     Kidney cancer-Hypernephroma  . Hypertension   . Thyroid disease   . Elevated cholesterol   . Cervical dysplasia   . Anxiety   . Depression   . COPD (chronic obstructive pulmonary disease)   . Osteoporosis    Review of Systems neg 12 pt systems review except as above.  BP: 126/78  Pulse: 68  Temp: 97.9 F (36.6 C)  Resp: 16   Objective:   Physical Exam  Constitutional: She appears well-nourished. No distress.  HENT:  Right Ear: External ear normal.  Left Ear: External ear  normal.  Nose: Nose normal.  Mouth/Throat: Oropharynx is clear and moist. No oropharyngeal exudate.  Eyes: EOM are normal. Pupils are equal, round, and reactive to light. No scleral icterus.  Neck: Normal range of motion. Neck supple. No JVD present. Thyromegaly present.  Cardiovascular: Normal rate, regular rhythm and normal heart sounds.   No murmur heard. Pulmonary/Chest: Effort normal and breath sounds normal. No respiratory distress. She has no wheezes. She has no rales.  Abdominal: She exhibits no mass. There is no guarding.  Slight distention with mild diffuse tenderness, but no rebound and bowel sounds are normal.  Lymphadenopathy:    She has no cervical adenopathy.  Skin: She is not diaphoretic.   Assessment & Plan:  1. Abdominal pain  empiric trial with Bentyl 20 mg #90 - 1 cap tid prn  2. Lymphocytic colitis, Hx  - Sed Rate (ESR) - CBC with Differential  [facility] CPE scheduled in about 2 weeks for F/U

## 2013-12-13 NOTE — Patient Instructions (Signed)
Collagenous Colitis, Lymphocytic Colitis Inflammatory bowel disease is a general name for diseases that cause inflammation in the intestines. Collagenous colitis and lymphocytic colitis are two types of bowel inflammation that affect the large intestine (colon).  They are not related to more severe forms of inflammatory bowel disease (Crohn's disease or ulcerative colitis). CAUSES  No precise cause has been found for collagenous colitis or lymphocytic colitis. Possible causes include:  Bacteria and their toxins.  Viruses.  Nonsteroidal anti-inflammatory drugs (NSAIDs). Some researchers have suggested that collagenous colitis and lymphocytic colitis result from an autoimmune response. This means that the body's immune system destroys cells for no known reason. SYMPTOMS   The symptoms are similar for both; chronic watery, non-bloody diarrhea.  The diarrhea may be continuous or episodic. Abdominal pain or cramps may also be present. DIAGNOSIS  The diagnosis of collagenous colitis or lymphocytic colitis is made after tissue samples are taken during colonoscopy or flexible sigmoidoscopy. These are tests that use tools to look inside your colon without having to operate. Samples taken from your colon are examined under a microscope.   Collagenous colitis is characterized by collagen deposits inside the lining of the colon. Multiple tissue samples from different areas of the colon may need to be examined. People with collagenous colitis are most often diagnosed in their 45's, although some cases have been reported in adults younger than 7 years and in children aged 12 to 96. It is diagnosed more frequently in women than men.  In lymphocytic colitis, tissue samples show inflammation with white blood cells, known as lymphocytes, between the cells that line the colon. In contrast to collagenous colitis, there is no abnormality of the collagen. TREATMENT  Treatment depends on the symptoms and severity  of the cases. The diseases have been known to resolve by themselves, but most patients have recurrent symptoms.  Lifestyle changes aimed at improving diarrhea are usually tried first. They include:  Reducing the amount of fat in the diet.  Eliminating foods that contain caffeine or lactose.  Not using NSAIDs.  If lifestyle changes are not enough, medicines are often used to control the symptoms. They include:  Anti-diarrheal medicines.  Anti-inflammatory medicines.  Immunosuppressive agents, which reduce the autoimmune response. This medicine is rarely needed.  For very extreme cases, bypass of the colon or surgery to remove all or part of the colon has been done in a few patients. This is rare.  Collagenous colitis and lymphocytic colitis do not increase the risk of colon cancer. FOR MORE INFORMATION  Crohn's & Colitis [employer] [facility] [employer] UpdateRate.fr National Institute of Diabetes and Digestive and Kidney Diseases: [employer] Document Released: 08/29/2004 Document Revised: 12/22/2011 Document Reviewed: 02/02/2008 [facility] Patient Information 2014 [place], [place].

## 2013-12-14 LAB — CBC WITH DIFFERENTIAL/PLATELET
Basophils Absolute: 0 10*3/uL (ref 0.0–0.1)
Basophils Relative: 0 % (ref 0–1)
EOS ABS: 1.2 10*3/uL — AB (ref 0.0–0.7)
Eosinophils Relative: 16 % — ABNORMAL HIGH (ref 0–5)
HEMATOCRIT: 38.5 % (ref 36.0–46.0)
HEMOGLOBIN: 13 g/dL (ref 12.0–15.0)
Lymphocytes Relative: 32 % (ref 12–46)
Lymphs Abs: 2.4 10*3/uL (ref 0.7–4.0)
MCH: 30.2 pg (ref 26.0–34.0)
MCHC: 33.8 g/dL (ref 30.0–36.0)
MCV: 89.5 fL (ref 78.0–100.0)
MONOS PCT: 8 % (ref 3–12)
Monocytes Absolute: 0.6 10*3/uL (ref 0.1–1.0)
NEUTROS PCT: 44 % (ref 43–77)
Neutro Abs: 3.3 10*3/uL (ref 1.7–7.7)
PLATELETS: 219 10*3/uL (ref 150–400)
RBC: 4.3 MIL/uL (ref 3.87–5.11)
RDW: 13.1 % (ref 11.5–15.5)
WBC: 7.4 10*3/uL (ref 4.0–10.5)

## 2013-12-14 LAB — SEDIMENTATION RATE: Sed Rate: 6 mm/hr (ref 0–22)

## 2013-12-19 ENCOUNTER — Ambulatory Visit (INDEPENDENT_AMBULATORY_CARE_PROVIDER_SITE_OTHER): Payer: Medicare Other | Admitting: Physician Assistant

## 2013-12-19 ENCOUNTER — Encounter: Payer: Self-pay | Admitting: Physician Assistant

## 2013-12-19 VITALS — BP 138/78 | HR 80 | Temp 97.9°F | Resp 16 | Wt 177.0 lb

## 2013-12-19 DIAGNOSIS — G4733 Obstructive sleep apnea (adult) (pediatric): Secondary | ICD-10-CM | POA: Diagnosis not present

## 2013-12-19 DIAGNOSIS — J209 Acute bronchitis, unspecified: Secondary | ICD-10-CM | POA: Diagnosis not present

## 2013-12-19 MED ORDER — DEXAMETHASONE SODIUM PHOSPHATE 100 MG/10ML IJ SOLN
10.0000 mg | Freq: Once | INTRAMUSCULAR | Status: AC
  Administered 2013-12-19: 10 mg via INTRAMUSCULAR

## 2013-12-19 MED ORDER — PROMETHAZINE-CODEINE 6.25-10 MG/5ML PO SYRP: 5.0000 mL | ORAL_SOLUTION | Freq: Four times a day (QID) | ORAL | Status: DC | PRN

## 2013-12-19 MED ORDER — LEVOFLOXACIN 500 MG PO TABS: 500.0000 mg | ORAL_TABLET | Freq: Every day | ORAL | Status: DC

## 2013-12-19 NOTE — Patient Instructions (Signed)
The majority of colds are caused by viruses and do not require antibiotics. Please read the rest of this hand out to learn more about the common cold and what you can do to help yourself as well as help prevent the over use of antibiotics.   COMMON COLD SIGNS AND SYMPTOMS - The common cold usually causes nasal congestion, runny nose, and sneezing. A sore throat may be present on the first day but usually resolves quickly. If a cough occurs, it generally develops on about the fourth or fifth day of symptoms, typically when congestion and runny nose are resolving  COMMON COLD COMPLICATIONS - In most cases, colds do not cause serious illness or complications. Most colds last for three to seven days, although many people continue to have symptoms (coughing, sneezing, congestion) for up to two weeks.  One of the more common complications is sinusitis, which is usually caused by viruses and rarely (about 2 percent of the time) by bacteria. Having thick or yellow to green-colored nasal discharge does not mean that bacterial sinusitis has developed; discolored nasal discharge is a normal phase of the common cold.  Lower respiratory infections, such as pneumonia or bronchitis, may develop following a cold.  Infection of the middle ear, or otitis media, can accompany or follow a cold.  COMMON COLD TREATMENT - There is no specific treatment for the viruses that cause the common cold. Most treatments are aimed at relieving some of the symptoms of the cold, but do not shorten or cure the cold. Antibiotics are not useful for treating the common cold; antibiotics are only used to treat illnesses caused by bacteria, not viruses. Unnecessary use of antibiotics for the treatment of the common cold can cause allergic reactions, diarrhea, or other gastrointestinal symptoms in some patients.  The symptoms of a cold will resolve over time, even without any treatment. People with underlying medical conditions and those who use  other over-the-counter or prescription medications should speak with their healthcare provider or pharmacist to ensure that it is safe to use these treatments. The following are treatments that may reduce the symptoms caused by the common cold.  Nasal congestion - Decongestants are good for nasal congestion- if you feel very stuffy but no mucus is coming out, this is the medication that will help you the most.  Pseudoephedrine is a decongestant that can improve nasal congestion. Although a prescription is not required, drugstores in the [place] keep pseudoephedrine behind the counter, so it must be requested from a pharmacist. If you have a heart condition or high blood pressure please use Coricidin BPH instead.   Runny nose - Antihistamines such as diphenhydramine (Benadryl), certazine (Zyrtec) which are best taking at night because they can make you tired OR loratadine (Claritin),  fexafinadine (Allegra) help with a runny nose.   Nasal sprays such an oxymetazoline (Afrin and others) may also give temporary relief of nasal congestion. However, these sprays should never be used for more than two to three days; use for more than three days use can worsen congestion.  Nasocort is now over the counter and can help decrease a runny nose. Please stop the medication if you have blurry vision or nose bleeds.   Sore throat and headache - Sore throat and headache are best treated with a mild pain reliever such as acetaminophen (Tylenol) or a non-steroidal anti-inflammatory agent such as ibuprofen or naproxen (Motrin or Aleve). These medications should be taken with food to prevent stomach problems. As well as   gargling with warm water and salt.   Cough - Common cough medicine ingredients include guaifenesin and dextromethorphan; these are often combined with other medications in over-the-counter cold formulas. Often a cough is worse at night or first in the morning due to post nasal drip from you nose. You can  try to sleep at an angle to decrease a cough.   Alternative treatments - Heated, humidified air can improve symptoms of nasal congestion and runny nose, and causes few to no side effects. A number of alternative products, including vitamin C, doubling up on your vitamin D and herbal products such as echinacea, may help. Certain products, such as nasal gels that contain zinc (eg, Zicam), have been associated with a permanent loss of smell.  Antibiotics - Antibiotics should not be used to treat an uncomplicated common cold. As noted above, colds are caused by viruses. Antibiotics treat bacterial, not viral infections. Some viruses that cause the common cold can also depress the immune system or cause swelling in the lining of the nose or airways; this can, in turn, lead to a bacterial infection. Often you need to give your body 7 days to fight off a common cold while treating the symptoms with the medications listed above. If after 7 days your symptoms are not improving, you are getting worse, you have shortness of breath, chest pain, a fever of over 103 you should seek medical help immediately.   PREVENTION IS THE BEST MEDICINE - Hand washing is an essential and highly effective way to prevent the spread of infection.  Alcohol-based hand rubs are a good alternative for disinfecting hands if a sink is not available.  Hands should be washed before preparing food and eating and after coughing, blowing the nose, or sneezing. While it is not always possible to limit contact with people who may be infected with a cold, touching the eyes, nose, or mouth after direct contact should be avoided when possible. Sneezing/coughing into the sleeve of one's clothing (at the inner elbow) is another means of containing sprays of saliva and secretions and does not contaminate the hands.    Sleep Apnea  Sleep apnea is a sleep disorder characterized by abnormal pauses in breathing while you sleep. When your breathing  pauses, the level of oxygen in your blood decreases. This causes you to move out of deep sleep and into light sleep. As a result, your quality of sleep is poor, and the system that carries your blood throughout your body (cardiovascular system) experiences stress. If sleep apnea remains untreated, the following conditions can develop:  High blood pressure (hypertension).  Coronary artery disease.  Inability to achieve or maintain an erection (impotence).  Impairment of your thought process (cognitive dysfunction). There are three types of sleep apnea: 1. Obstructive sleep apnea Pauses in breathing during sleep because of a blocked airway. 2. Central sleep apnea Pauses in breathing during sleep because the area of the brain that controls your breathing does not send the correct signals to the muscles that control breathing. 3. Mixed sleep apnea A combination of both obstructive and central sleep apnea. RISK FACTORS The following risk factors can increase your risk of developing sleep apnea:  Being overweight.  Smoking.  Having narrow passages in your nose and throat.  Being of older age.  Being female.  Alcohol use.  Sedative and tranquilizer use.  Ethnicity. Among individuals younger than 35 years, African Americans are at increased risk of sleep apnea. SYMPTOMS   Difficulty staying asleep.  Daytime sleepiness and fatigue.  Loss of energy.  Irritability.  Loud, heavy snoring.  Morning headaches.  Trouble concentrating.  Forgetfulness.  Decreased interest in sex. DIAGNOSIS  In order to diagnose sleep apnea, your caregiver will perform a physical examination. Your caregiver may suggest that you take a home sleep test. Your caregiver may also recommend that you spend the night in a sleep lab. In the sleep lab, several monitors record information about your heart, lungs, and brain while you sleep. Your leg and arm movements and blood oxygen level are also  recorded. TREATMENT The following actions may help to resolve mild sleep apnea:  Sleeping on your side.   Using a decongestant if you have nasal congestion.   Avoiding the use of depressants, including alcohol, sedatives, and narcotics.   Losing weight and modifying your diet if you are overweight. There also are devices and treatments to help open your airway:  Oral appliances. These are custom-made mouthpieces that shift your lower jaw forward and slightly open your bite. This opens your airway.  Devices that create positive airway pressure. This positive pressure "splints" your airway open to help you breathe better during sleep. The following devices create positive airway pressure:  Continuous positive airway pressure (CPAP) device. The CPAP device creates a continuous level of air pressure with an air pump. The air is delivered to your airway through a mask while you sleep. This continuous pressure keeps your airway open.  Nasal expiratory positive airway pressure (EPAP) device. The EPAP device creates positive air pressure as you exhale. The device consists of single-use valves, which are inserted into each nostril and held in place by adhesive. The valves create very little resistance when you inhale but create much more resistance when you exhale. That increased resistance creates the positive airway pressure. This positive pressure while you exhale keeps your airway open, making it easier to breath when you inhale again.  Bilevel positive airway pressure (BPAP) device. The BPAP device is used mainly in patients with central sleep apnea. This device is similar to the CPAP device because it also uses an air pump to deliver continuous air pressure through a mask. However, with the BPAP machine, the pressure is set at two different levels. The pressure when you exhale is lower than the pressure when you inhale.  Surgery. Typically, surgery is only done if you cannot comply with less  invasive treatments or if the less invasive treatments do not improve your condition. Surgery involves removing excess tissue in your airway to create a wider passage way. Document Released: 09/19/2002 Document Revised: 01/24/2013 Document Reviewed: 02/05/2012 [facility] Patient Information 2014 [place].

## 2013-12-19 NOTE — Progress Notes (Signed)
   Subjective:    Patient ID: [name] [name], female    DOB: May 16, 1946, 68 y.o.   MRN: [id number]  Cough This is a new problem. The current episode started yesterday. The problem has been gradually worsening. The problem occurs constantly. The cough is productive of purulent sputum. Associated symptoms include chills, heartburn, nasal congestion, postnasal drip, shortness of breath and wheezing. Pertinent negatives include no chest pain, ear congestion, ear pain, fever, headaches, hemoptysis, myalgias, rash, rhinorrhea, sore throat, sweats or weight loss. Nothing aggravates the symptoms. She has tried nothing for the symptoms.   Very crowded mouth, snoring, has CPAP but can not use it.   Review of Systems  Constitutional: Positive for chills and fatigue (last week felt bad). Negative for fever and weight loss.  HENT: Positive for postnasal drip and sinus pressure. Negative for congestion, dental problem, ear pain, rhinorrhea, sore throat, tinnitus, trouble swallowing and voice change.   Eyes: Negative.   Respiratory: Positive for cough (dry cough for weeks), chest tightness, shortness of breath and wheezing. Negative for hemoptysis.   Cardiovascular: Negative.  Negative for chest pain.  Gastrointestinal: Positive for heartburn.  Musculoskeletal: Negative.  Negative for myalgias.  Skin: Negative for rash.  Neurological: Negative.  Negative for headaches.       Objective:   Physical Exam  Constitutional: She is oriented to person, place, and time. She appears well-developed and well-nourished.  HENT:  Head: Normocephalic and atraumatic.  Right Ear: External ear normal.  Left Ear: External ear normal.  Nose: Nose normal.  Mouth/Throat: Oropharynx is clear and moist.  Eyes: Conjunctivae are normal. Pupils are equal, round, and reactive to light.  Neck: Normal range of motion. Neck supple.  Cardiovascular: Normal rate and regular rhythm.   Pulmonary/Chest: Effort normal. No  respiratory distress. She has wheezes. She has no rales. She exhibits no tenderness.  Abdominal: Soft. Bowel sounds are normal.  Lymphadenopathy:    She has no cervical adenopathy.  Neurological: She is alert and oriented to person, place, and time.  Skin: Skin is warm and dry.       Assessment & Plan:  1. Acute bronchitis - dexamethasone (DECADRON) injection 10 mg; Inject 1 mL (10 mg total) into the muscle once. - levofloxacin (LEVAQUIN) 500 MG tablet; Take 1 tablet (500 mg total) by mouth daily.  Dispense: 10 tablet; Refill: 0 - promethazine-codeine (PHENERGAN WITH CODEINE) 6.25-10 MG/5ML syrup; Take 5 mLs by mouth every 6 (six) hours as needed for cough.  Dispense: 240 mL; Refill: 0  2. OSA (obstructive sleep apnea) Refer to Dr. Toy Cookey, unable to tolerate CPAP mask - Ambulatory referral to Sleep Studies

## 2013-12-22 ENCOUNTER — Other Ambulatory Visit: Payer: Self-pay | Admitting: Internal Medicine

## 2013-12-22 DIAGNOSIS — H18059 Posterior corneal pigmentations, unspecified eye: Secondary | ICD-10-CM | POA: Diagnosis not present

## 2013-12-22 DIAGNOSIS — H40059 Ocular hypertension, unspecified eye: Secondary | ICD-10-CM | POA: Diagnosis not present

## 2013-12-22 DIAGNOSIS — H04129 Dry eye syndrome of unspecified lacrimal gland: Secondary | ICD-10-CM | POA: Diagnosis not present

## 2013-12-22 DIAGNOSIS — H5200 Hypermetropia, unspecified eye: Secondary | ICD-10-CM | POA: Diagnosis not present

## 2014-01-04 ENCOUNTER — Encounter: Payer: Self-pay | Admitting: Physician Assistant

## 2014-01-31 ENCOUNTER — Other Ambulatory Visit: Payer: Self-pay | Admitting: Physician Assistant

## 2014-02-07 DIAGNOSIS — N949 Unspecified condition associated with female genital organs and menstrual cycle: Secondary | ICD-10-CM | POA: Diagnosis not present

## 2014-02-08 ENCOUNTER — Ambulatory Visit (INDEPENDENT_AMBULATORY_CARE_PROVIDER_SITE_OTHER): Payer: Medicare Other

## 2014-02-08 VITALS — BP 140/88 | HR 69 | Resp 16 | Ht 66.0 in | Wt 174.0 lb

## 2014-02-08 DIAGNOSIS — D219 Benign neoplasm of connective and other soft tissue, unspecified: Secondary | ICD-10-CM | POA: Diagnosis not present

## 2014-02-08 DIAGNOSIS — D179 Benign lipomatous neoplasm, unspecified: Secondary | ICD-10-CM

## 2014-02-08 DIAGNOSIS — M79609 Pain in unspecified limb: Secondary | ICD-10-CM

## 2014-02-08 NOTE — Progress Notes (Signed)
   Subjective:    Patient ID: JAYCI ELLEFSON, female    DOB: 1946/05/22, 68 y.o.   MRN: [id number]  HPI Comments: N Lumps L B/L hindfoot lateral D 1year ago increasing in size O originally on the anterior ankle left C soft, mushy lumps A uncomfortable in certain shoes T Dr Louanne Belton suggested a podiatrist visit     Review of Systems  HENT: Positive for sinus pressure.   Gastrointestinal:       Lymphstatic colitis - Dr Collene Mares  Genitourinary:       Kidney cancer - 2002 removal  Skin: Positive for rash.  All other systems reviewed and are negative.      Objective:   Physical Exam 68 year old white female well-developed well-nourished oriented x3 presents at this time with concern about a painful soft tissue mass place the sinus tarsi distal to both ankle areas right more so than left. Patient has these soft tissue lesions or cysts in the sinus tarsi lateral ankle area been present for several years they're increasing in size fitting painful symptomatically certain shoes. No history of injury or trauma noted also some on the anterior medial left ankle although not as symptomatic. The extremity objective findings vascular status is intact pedal pulses palpable DP +2/4 PT one over 4 bilateral capillary refill time 3 seconds all digits skin temperature is warm turgor normal no edema rubor pallor or varicosities noted neurologically epicritic and proprioceptive sensations intact and symmetric bilateral there is normal plantar response DTRs not listed dermatologically skin color pigment normal hair growth absent patient is in the dry scaly plaque or. He like flat verrucoid type lesions are actinic keratoses dorsum of both feet and ankle areas and using topical cream for treating these with some success. Remainder of orthopedic biomechanical exam unremarkable rectus foot ankle subtalar midtarsal joints normal x-ray evaluations no osseous saturation to these lesions are noted.       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 This time discussed options consideration for possible steroid injection to consider decreasing the size or symptomology the tumors are patient is interested more definitive removal and correction at this time advised and consent form for excision of suspect soft tissue mass or tumor or cyst suspect lipoma both ankles is reviewed and signed consent forms are reviewed all questions asked medication are answered there no complications patient understands and post surgery for about 4 weeks she'll be in air fracture boot with moderate activities understands risks for scars in recurrence is possible. Should discuss with family and home and her husband about appropriate date for surgery they're considering moving and currently at this time she is scheduled vacation planned in May likely so surgery dates sometime after that likely onset a surgical date at her convenience or consent form is reviewed and signed and all questions asked medication are answered  Harriet Masson DPM

## 2014-02-08 NOTE — Patient Instructions (Signed)
Pre-Operative Instructions  Congratulations, you have decided to take an important step to improving your quality of life.  You can be assured that the doctors of [facility] will be with you every step of the way.  1. Plan to be at the surgery center/hospital at least 1 (one) hour prior to your scheduled time unless otherwise directed by the surgical center/hospital staff.  You must have a responsible adult accompany you, remain during the surgery and drive you home.  Make sure you have directions to the surgical center/hospital and know how to get there on time. 2. For hospital based surgery you will need to obtain a history and physical form from your family physician within 1 month prior to the date of surgery- we will give you a form for you primary physician.  3. We make every effort to accommodate the date you request for surgery.  There are however, times where surgery dates or times have to be moved.  We will contact you as soon as possible if a change in schedule is required.   4. No Aspirin/Ibuprofen for one week before surgery.  If you are on aspirin, any non-steroidal anti-inflammatory medications (Mobic, Aleve, Ibuprofen) you should stop taking it 7 days prior to your surgery.  You make take Tylenol  For pain prior to surgery.  5. Medications- If you are taking daily heart and blood pressure medications, seizure, reflux, allergy, asthma, anxiety, pain or diabetes medications, make sure the surgery center/hospital is aware before the day of surgery so they may notify you which medications to take or avoid the day of surgery. 6. No food or drink after midnight the night before surgery unless directed otherwise by surgical center/hospital staff. 7. No alcoholic beverages 24 hours prior to surgery.  No smoking 24 hours prior to or 24 hours after surgery. 8. Wear loose pants or shorts- loose enough to fit over bandages, boots, and casts. 9. No slip on shoes, sneakers are best. 10. Bring  your boot with you to the surgery center/hospital.  Also bring crutches or a walker if your physician has prescribed it for you.  If you do not have this equipment, it will be provided for you after surgery. 11. If you have not been contracted by the surgery center/hospital by the day before your surgery, call to confirm the date and time of your surgery. 12. Leave-time from work may vary depending on the type of surgery you have.  Appropriate arrangements should be made prior to surgery with your employer. 13. Prescriptions will be provided immediately following surgery by your doctor.  Have these filled as soon as possible after surgery and take the medication as directed. 14. Remove nail polish on the operative foot. 15. Wash the night before surgery.  The night before surgery wash the foot and leg well with the antibacterial soap provided and water paying special attention to beneath the toenails and in between the toes.  Rinse thoroughly with water and dry well with a towel.  Perform this wash unless told not to do so by your physician.  Enclosed: 1 Ice pack (please put in freezer the night before surgery)   1 Hibiclens skin cleaner   Pre-op Instructions  If you have any questions regarding the instructions, do not hesitate to call our office.  [place]: [address] [place], [place] [address] [phone]  [place]: [address] [place], [place] [address] [phone]  [place]: [address]  ,  [address] [phone]  Dr. Mikhai Bienvenue   Tuchman DPM, Dr. Norman Regal DPM Dr. Bernadine Melecio DPM, Dr. M. Todd Hyatt DPM, Dr. Kathryn Egerton DPM 

## 2014-02-14 ENCOUNTER — Ambulatory Visit (INDEPENDENT_AMBULATORY_CARE_PROVIDER_SITE_OTHER): Payer: Medicare Other | Admitting: Internal Medicine

## 2014-02-14 ENCOUNTER — Encounter: Payer: Self-pay | Admitting: Internal Medicine

## 2014-02-14 VITALS — BP 126/82 | HR 88 | Temp 98.2°F | Resp 16 | Ht 65.25 in | Wt 179.0 lb

## 2014-02-14 DIAGNOSIS — J0410 Acute tracheitis without obstruction: Secondary | ICD-10-CM

## 2014-02-14 DIAGNOSIS — R7309 Other abnormal glucose: Secondary | ICD-10-CM | POA: Diagnosis not present

## 2014-02-14 DIAGNOSIS — E559 Vitamin D deficiency, unspecified: Secondary | ICD-10-CM | POA: Diagnosis not present

## 2014-02-14 DIAGNOSIS — J45909 Unspecified asthma, uncomplicated: Secondary | ICD-10-CM | POA: Diagnosis not present

## 2014-02-14 MED ORDER — HYDROCODONE-ACETAMINOPHEN 5-325 MG PO TABS: ORAL_TABLET | ORAL | Status: DC

## 2014-02-14 MED ORDER — AZITHROMYCIN 250 MG PO TABS: ORAL_TABLET | ORAL | Status: DC

## 2014-02-14 MED ORDER — PREDNISONE 20 MG PO TABS: 20.0000 mg | ORAL_TABLET | ORAL | Status: DC

## 2014-02-14 NOTE — Progress Notes (Signed)
   Subjective:    Patient ID: [name] [name], female    DOB: Feb 15, 1946, 68 y.o.   MRN: [id number]  Sinusitis This is a new problem. The current episode started in the past 7 days. The problem has been waxing and waning since onset. There has been no fever. The pain is mild. Associated symptoms include chills, congestion, coughing, headaches, a hoarse voice, sinus pressure, sneezing and a sore throat. Pertinent negatives include no diaphoresis, ear pain, neck pain, shortness of breath or swollen glands. Past treatments include oral decongestants.  Cough Associated symptoms include chills, headaches, rhinorrhea, a sore throat and wheezing. Pertinent negatives include no chest pain, ear pain, fever, postnasal drip, rash or shortness of breath.    Review of Systems  Constitutional: Positive for chills. Negative for fever and diaphoresis.  HENT: Positive for congestion, hoarse voice, rhinorrhea, sinus pressure, sneezing and sore throat. Negative for drooling, ear pain, facial swelling, hearing loss, mouth sores, nosebleeds, postnasal drip, tinnitus, trouble swallowing and voice change.   Eyes: Negative.   Respiratory: Positive for cough, chest tightness and wheezing. Negative for choking and shortness of breath.   Cardiovascular: Negative for chest pain, palpitations and leg swelling.  Gastrointestinal: Negative.   Musculoskeletal: Negative for neck pain.  Skin: Negative for color change, pallor and rash.  Neurological: Positive for headaches.  Hematological: Negative.     Objective:   Physical Exam  Constitutional: She is oriented to person, place, and time. No distress.  HENT:  Nose: Nose normal.  Mouth/Throat: Oropharynx is clear and moist. No oropharyngeal exudate.  Bilateral frontal & maxillary tenderness.  Eyes: EOM are normal. Pupils are equal, round, and reactive to light.  Neck: Normal range of motion. Neck supple. No thyromegaly present.  Cardiovascular: Normal rate, regular  rhythm and normal heart sounds.   No murmur heard. Pulmonary/Chest: Effort normal. No respiratory distress. She has wheezes. She has rales.  Abdominal: Soft.  Lymphadenopathy:    She has no cervical adenopathy.  Neurological: She is alert and oriented to person, place, and time. No cranial nerve deficit. Coordination normal.  Skin: Skin is warm and dry. No rash noted. She is not diaphoretic. No erythema. No pallor.    Assessment & Plan:   1. Acute tracheitis without mention of obstruction - Rx Zpack, Prednisone taper, Vicodin prn  2. Intrinsic asthma, unspecified

## 2014-02-14 NOTE — Patient Instructions (Signed)
Asthma, Adult Asthma is a recurring condition in which the airways tighten and narrow. Asthma can make it difficult to breathe. It can cause coughing, wheezing, and shortness of breath. Asthma episodes (also called asthma attacks) range from minor to life-threatening. Asthma cannot be cured, but medicines and lifestyle changes can help control it. CAUSES Asthma is believed to be caused by inherited (genetic) and environmental factors, but its exact cause is unknown. Asthma may be triggered by allergens, lung infections, or irritants in the air. Asthma triggers are different for each person. Common triggers include:   Animal dander.  Dust mites.  Cockroaches.  Pollen from trees or grass.  Mold.  Smoke.  Air pollutants such as dust, household cleaners, hair sprays, aerosol sprays, paint fumes, strong chemicals, or strong odors.  Cold air, weather changes, and winds (which increase molds and pollens in the air).  Strong emotional expressions such as crying or laughing hard.  Stress.  Certain medicines (such as aspirin) or types of drugs (such as beta-blockers).  Sulfites in foods and drinks. Foods and drinks that may contain sulfites include dried fruit, potato chips, and sparkling grape juice.  Infections or inflammatory conditions such as the flu, a cold, or an inflammation of the nasal membranes (rhinitis).  Gastroesophageal reflux disease (GERD).  Exercise or strenuous activity. SYMPTOMS Symptoms may occur immediately after asthma is triggered or many hours later. Symptoms include:  Wheezing.  Excessive nighttime or early morning coughing.  Frequent or severe coughing with a common cold.  Chest tightness.  Shortness of breath. DIAGNOSIS  The diagnosis of asthma is made by a review of your medical history and a physical exam. Tests may also be performed. These may include:  Lung function studies. These tests show how much air you breath in and out.  Allergy  tests.  Imaging tests such as X-rays. TREATMENT  Asthma cannot be cured, but it can usually be controlled. Treatment involves identifying and avoiding your asthma triggers. It also involves medicines. There are 2 classes of medicine used for asthma treatment:   Controller medicines. These prevent asthma symptoms from occurring. They are usually taken every day.  Reliever or rescue medicines. These quickly relieve asthma symptoms. They are used as needed and provide short-term relief. Your health care provider will help you create an asthma action plan. An asthma action plan is a written plan for managing and treating your asthma attacks. It includes a list of your asthma triggers and how they may be avoided. It also includes information on when medicines should be taken and when their dosage should be changed. An action plan may also involve the use of a device called a peak flow meter. A peak flow meter measures how well the lungs are working. It helps you monitor your condition. HOME CARE INSTRUCTIONS   Take medicine as directed by your health care provider. Speak with your health care provider if you have questions about how or when to take the medicines.  Use a peak flow meter as directed by your health care provider. Record and keep track of readings.  Understand and use the action plan to help minimize or stop an asthma attack without needing to seek medical care.  Control your home environment in the following ways to help prevent asthma attacks:  Do not smoke. Avoid being exposed to secondhand smoke.  Change your heating and air conditioning filter regularly.  Limit your use of fireplaces and wood stoves.  Get rid of pests (such as roaches and   mice) and their droppings.  Throw away plants if you see mold on them.  Clean your floors and dust regularly. Use unscented cleaning products.  Try to have someone else vacuum for you regularly. Stay out of rooms while they are being  vacuumed and for a short while afterward. If you vacuum, use a dust mask from a hardware store, a double-layered or microfilter vacuum cleaner bag, or a vacuum cleaner with a HEPA filter.  Replace carpet with wood, tile, or vinyl flooring. Carpet can trap dander and dust.  Use allergy-proof pillows, mattress covers, and box spring covers.  Wash bed sheets and blankets every week in hot water and dry them in a dryer.  Use blankets that are made of polyester or cotton.  Clean bathrooms and kitchens with bleach. If possible, have someone repaint the walls in these rooms with mold-resistant paint. Keep out of the rooms that are being cleaned and painted.  Wash hands frequently. SEEK MEDICAL CARE IF:   You have wheezing, shortness of breath, or a cough even if taking medicine to prevent attacks.  The colored mucus you cough up (sputum) is thicker than usual.  Your sputum changes from clear or white to yellow, green, gray, or bloody.  You have any problems that may be related to the medicines you are taking (such as a rash, itching, swelling, or trouble breathing).  You are using a reliever medicine more than 2 3 times per week.  Your peak flow is still at 50 79% of you personal best after following your action plan for 1 hour. SEEK IMMEDIATE MEDICAL CARE IF:   You seem to be getting worse and are unresponsive to treatment during an asthma attack.  You are short of breath even at rest.  You get short of breath when doing very little physical activity.  You have difficulty eating, drinking, or talking due to asthma symptoms.  You develop chest pain.  You develop a fast heartbeat.  You have a bluish color to your lips or fingernails.  You are lightheaded, dizzy, or faint.  Your peak flow is less than 50% of your personal best.  You have a fever or persistent symptoms for more than 2 3 days.  You have a fever and symptoms suddenly get worse. MAKE SURE YOU:   Understand these  instructions.  Will watch your condition.  Will get help right away if you are not doing well or get worse. Document Released: 09/29/2005 Document Revised: 06/01/2013 Document Reviewed: 04/28/2013 [employer] Patient Information 2014 [employer].  

## 2014-02-20 ENCOUNTER — Other Ambulatory Visit: Payer: Self-pay | Admitting: Internal Medicine

## 2014-02-20 MED ORDER — SERTRALINE HCL 50 MG PO TABS: 50.0000 mg | ORAL_TABLET | Freq: Every day | ORAL | Status: DC

## 2014-02-21 ENCOUNTER — Other Ambulatory Visit: Payer: Self-pay | Admitting: Physician Assistant

## 2014-02-21 ENCOUNTER — Other Ambulatory Visit: Payer: Self-pay | Admitting: Internal Medicine

## 2014-03-09 ENCOUNTER — Telehealth: Payer: Self-pay | Admitting: *Deleted

## 2014-03-09 DIAGNOSIS — K219 Gastro-esophageal reflux disease without esophagitis: Secondary | ICD-10-CM | POA: Diagnosis not present

## 2014-03-09 DIAGNOSIS — R198 Other specified symptoms and signs involving the digestive system and abdomen: Secondary | ICD-10-CM | POA: Diagnosis not present

## 2014-03-09 DIAGNOSIS — Z86010 Personal history of colonic polyps: Secondary | ICD-10-CM | POA: Diagnosis not present

## 2014-03-09 DIAGNOSIS — K5289 Other specified noninfective gastroenteritis and colitis: Secondary | ICD-10-CM | POA: Diagnosis not present

## 2014-03-09 NOTE — Telephone Encounter (Signed)
Pt just left Dr Youlanda Mighty office & they seem to think the zoloft has caused a flare up of colitis. Pt wants to know is there something else she can try           [facility]  [address]

## 2014-03-13 NOTE — Telephone Encounter (Signed)
Patient aware of new RX Wellburtin XR 150 , she is also aware not to take for 4 weeks.

## 2014-03-15 ENCOUNTER — Other Ambulatory Visit: Payer: Self-pay | Admitting: Internal Medicine

## 2014-03-15 DIAGNOSIS — Z1231 Encounter for screening mammogram for malignant neoplasm of breast: Secondary | ICD-10-CM

## 2014-03-22 ENCOUNTER — Ambulatory Visit (HOSPITAL_COMMUNITY)
Admission: RE | Admit: 2014-03-22 | Discharge: 2014-03-22 | Disposition: A | Discharge status: Home / Self Care | Payer: Medicare Other | Source: Ambulatory Visit | Attending: Internal Medicine | Admitting: Internal Medicine

## 2014-03-22 ENCOUNTER — Encounter (INDEPENDENT_AMBULATORY_CARE_PROVIDER_SITE_OTHER): Payer: Self-pay

## 2014-03-22 ENCOUNTER — Other Ambulatory Visit: Payer: Self-pay | Admitting: Internal Medicine

## 2014-03-22 DIAGNOSIS — Z803 Family history of malignant neoplasm of breast: Secondary | ICD-10-CM | POA: Diagnosis not present

## 2014-03-22 DIAGNOSIS — Z1231 Encounter for screening mammogram for malignant neoplasm of breast: Secondary | ICD-10-CM

## 2014-03-24 DIAGNOSIS — H40059 Ocular hypertension, unspecified eye: Secondary | ICD-10-CM | POA: Diagnosis not present

## 2014-03-24 DIAGNOSIS — H5200 Hypermetropia, unspecified eye: Secondary | ICD-10-CM | POA: Diagnosis not present

## 2014-03-24 DIAGNOSIS — H40019 Open angle with borderline findings, low risk, unspecified eye: Secondary | ICD-10-CM | POA: Diagnosis not present

## 2014-03-24 DIAGNOSIS — H18059 Posterior corneal pigmentations, unspecified eye: Secondary | ICD-10-CM | POA: Diagnosis not present

## 2014-03-28 ENCOUNTER — Telehealth: Payer: Self-pay

## 2014-03-28 NOTE — Telephone Encounter (Signed)
No ,sorry.

## 2014-03-28 NOTE — Telephone Encounter (Signed)
Patient informed. 

## 2014-03-28 NOTE — Telephone Encounter (Signed)
Patient is moving to [facility], [place] at end of June. Wanted to see if you could recommend a GYN for her there.

## 2014-04-01 ENCOUNTER — Other Ambulatory Visit: Payer: Self-pay | Admitting: Physician Assistant

## 2014-04-01 ENCOUNTER — Other Ambulatory Visit: Payer: Self-pay | Admitting: Internal Medicine

## 2014-04-10 ENCOUNTER — Other Ambulatory Visit: Payer: Self-pay | Admitting: Emergency Medicine

## 2014-04-13 DIAGNOSIS — R609 Edema, unspecified: Secondary | ICD-10-CM | POA: Diagnosis not present

## 2014-04-13 DIAGNOSIS — M7989 Other specified soft tissue disorders: Secondary | ICD-10-CM | POA: Diagnosis not present

## 2014-04-14 DIAGNOSIS — M7989 Other specified soft tissue disorders: Secondary | ICD-10-CM | POA: Diagnosis not present

## 2014-04-17 DIAGNOSIS — I10 Essential (primary) hypertension: Secondary | ICD-10-CM | POA: Diagnosis not present

## 2014-04-26 DIAGNOSIS — J0110 Acute frontal sinusitis, unspecified: Secondary | ICD-10-CM | POA: Diagnosis not present

## 2014-04-26 DIAGNOSIS — J0100 Acute maxillary sinusitis, unspecified: Secondary | ICD-10-CM | POA: Diagnosis not present

## 2014-05-01 DIAGNOSIS — N12 Tubulo-interstitial nephritis, not specified as acute or chronic: Secondary | ICD-10-CM | POA: Diagnosis not present

## 2014-05-01 DIAGNOSIS — M5380 Other specified dorsopathies, site unspecified: Secondary | ICD-10-CM | POA: Diagnosis not present

## 2014-05-01 DIAGNOSIS — R809 Proteinuria, unspecified: Secondary | ICD-10-CM | POA: Diagnosis not present

## 2014-05-01 DIAGNOSIS — Z880 Allergy status to penicillin: Secondary | ICD-10-CM | POA: Diagnosis not present

## 2014-05-01 DIAGNOSIS — N189 Chronic kidney disease, unspecified: Secondary | ICD-10-CM | POA: Diagnosis not present

## 2014-05-01 DIAGNOSIS — Z882 Allergy status to sulfonamides status: Secondary | ICD-10-CM | POA: Diagnosis not present

## 2014-05-01 DIAGNOSIS — Z85528 Personal history of other malignant neoplasm of kidney: Secondary | ICD-10-CM | POA: Diagnosis not present

## 2014-05-01 DIAGNOSIS — N19 Unspecified kidney failure: Secondary | ICD-10-CM | POA: Diagnosis not present

## 2014-05-01 DIAGNOSIS — Z905 Acquired absence of kidney: Secondary | ICD-10-CM | POA: Diagnosis not present

## 2014-05-01 DIAGNOSIS — R319 Hematuria, unspecified: Secondary | ICD-10-CM | POA: Diagnosis not present

## 2014-05-01 DIAGNOSIS — R109 Unspecified abdominal pain: Secondary | ICD-10-CM | POA: Diagnosis not present

## 2014-05-01 DIAGNOSIS — R110 Nausea: Secondary | ICD-10-CM | POA: Diagnosis not present

## 2014-05-01 DIAGNOSIS — D721 Eosinophilia, unspecified: Secondary | ICD-10-CM | POA: Diagnosis not present

## 2014-05-01 DIAGNOSIS — Z7189 Other specified counseling: Secondary | ICD-10-CM | POA: Diagnosis not present

## 2014-05-01 DIAGNOSIS — I129 Hypertensive chronic kidney disease with stage 1 through stage 4 chronic kidney disease, or unspecified chronic kidney disease: Secondary | ICD-10-CM | POA: Diagnosis not present

## 2014-05-01 DIAGNOSIS — N37 Urethral disorders in diseases classified elsewhere: Secondary | ICD-10-CM | POA: Diagnosis not present

## 2014-05-01 DIAGNOSIS — J019 Acute sinusitis, unspecified: Secondary | ICD-10-CM | POA: Diagnosis not present

## 2014-05-01 DIAGNOSIS — I10 Essential (primary) hypertension: Secondary | ICD-10-CM | POA: Diagnosis not present

## 2014-05-01 DIAGNOSIS — N179 Acute kidney failure, unspecified: Secondary | ICD-10-CM | POA: Diagnosis not present

## 2014-05-01 DIAGNOSIS — E039 Hypothyroidism, unspecified: Secondary | ICD-10-CM | POA: Diagnosis not present

## 2014-05-01 DIAGNOSIS — Z8553 Personal history of malignant neoplasm of renal pelvis: Secondary | ICD-10-CM | POA: Diagnosis not present

## 2014-05-08 DIAGNOSIS — N19 Unspecified kidney failure: Secondary | ICD-10-CM | POA: Diagnosis not present

## 2014-05-08 DIAGNOSIS — N12 Tubulo-interstitial nephritis, not specified as acute or chronic: Secondary | ICD-10-CM | POA: Diagnosis not present

## 2014-05-10 DIAGNOSIS — E039 Hypothyroidism, unspecified: Secondary | ICD-10-CM | POA: Diagnosis not present

## 2014-05-10 DIAGNOSIS — N19 Unspecified kidney failure: Secondary | ICD-10-CM | POA: Diagnosis not present

## 2014-05-22 ENCOUNTER — Encounter: Payer: Self-pay | Admitting: Internal Medicine

## 2014-06-01 DIAGNOSIS — D126 Benign neoplasm of colon, unspecified: Secondary | ICD-10-CM | POA: Diagnosis not present

## 2014-06-01 DIAGNOSIS — I10 Essential (primary) hypertension: Secondary | ICD-10-CM | POA: Diagnosis not present

## 2014-06-01 DIAGNOSIS — G4730 Sleep apnea, unspecified: Secondary | ICD-10-CM | POA: Diagnosis not present

## 2014-06-01 DIAGNOSIS — E039 Hypothyroidism, unspecified: Secondary | ICD-10-CM | POA: Diagnosis not present

## 2014-06-01 DIAGNOSIS — E669 Obesity, unspecified: Secondary | ICD-10-CM | POA: Diagnosis not present

## 2014-06-01 DIAGNOSIS — E785 Hyperlipidemia, unspecified: Secondary | ICD-10-CM | POA: Diagnosis not present

## 2014-06-01 DIAGNOSIS — J329 Chronic sinusitis, unspecified: Secondary | ICD-10-CM | POA: Diagnosis not present

## 2014-06-01 DIAGNOSIS — Z85528 Personal history of other malignant neoplasm of kidney: Secondary | ICD-10-CM | POA: Diagnosis not present

## 2014-06-01 DIAGNOSIS — F411 Generalized anxiety disorder: Secondary | ICD-10-CM | POA: Diagnosis not present

## 2014-06-12 DIAGNOSIS — R809 Proteinuria, unspecified: Secondary | ICD-10-CM | POA: Diagnosis not present

## 2014-06-12 DIAGNOSIS — M5380 Other specified dorsopathies, site unspecified: Secondary | ICD-10-CM | POA: Diagnosis not present

## 2014-06-12 DIAGNOSIS — I10 Essential (primary) hypertension: Secondary | ICD-10-CM | POA: Diagnosis not present

## 2014-06-12 DIAGNOSIS — N289 Disorder of kidney and ureter, unspecified: Secondary | ICD-10-CM | POA: Diagnosis not present

## 2014-06-22 DIAGNOSIS — E039 Hypothyroidism, unspecified: Secondary | ICD-10-CM | POA: Diagnosis not present

## 2014-06-22 DIAGNOSIS — Z85528 Personal history of other malignant neoplasm of kidney: Secondary | ICD-10-CM | POA: Diagnosis not present

## 2014-06-22 DIAGNOSIS — E785 Hyperlipidemia, unspecified: Secondary | ICD-10-CM | POA: Diagnosis not present

## 2014-06-29 DIAGNOSIS — I10 Essential (primary) hypertension: Secondary | ICD-10-CM | POA: Diagnosis not present

## 2014-06-29 DIAGNOSIS — K5150 Left sided colitis without complications: Secondary | ICD-10-CM | POA: Diagnosis not present

## 2014-07-04 DIAGNOSIS — K5180 Other ulcerative colitis without complications: Secondary | ICD-10-CM | POA: Diagnosis not present

## 2014-07-04 DIAGNOSIS — I10 Essential (primary) hypertension: Secondary | ICD-10-CM | POA: Diagnosis not present

## 2014-07-04 DIAGNOSIS — J309 Allergic rhinitis, unspecified: Secondary | ICD-10-CM | POA: Diagnosis not present

## 2014-07-04 DIAGNOSIS — E669 Obesity, unspecified: Secondary | ICD-10-CM | POA: Diagnosis not present

## 2014-07-04 DIAGNOSIS — E785 Hyperlipidemia, unspecified: Secondary | ICD-10-CM | POA: Diagnosis not present

## 2014-07-04 DIAGNOSIS — Z85528 Personal history of other malignant neoplasm of kidney: Secondary | ICD-10-CM | POA: Diagnosis not present

## 2014-07-04 DIAGNOSIS — E039 Hypothyroidism, unspecified: Secondary | ICD-10-CM | POA: Diagnosis not present

## 2014-07-04 DIAGNOSIS — F411 Generalized anxiety disorder: Secondary | ICD-10-CM | POA: Diagnosis not present

## 2014-07-26 DIAGNOSIS — K6289 Other specified diseases of anus and rectum: Secondary | ICD-10-CM | POA: Diagnosis not present

## 2014-07-26 DIAGNOSIS — K210 Gastro-esophageal reflux disease with esophagitis: Secondary | ICD-10-CM | POA: Diagnosis not present

## 2014-07-26 DIAGNOSIS — K5289 Other specified noninfective gastroenteritis and colitis: Secondary | ICD-10-CM | POA: Diagnosis not present

## 2014-07-26 DIAGNOSIS — Z86010 Personal history of colonic polyps: Secondary | ICD-10-CM | POA: Diagnosis not present

## 2014-08-03 DIAGNOSIS — M5416 Radiculopathy, lumbar region: Secondary | ICD-10-CM | POA: Diagnosis not present

## 2014-08-03 DIAGNOSIS — Z85528 Personal history of other malignant neoplasm of kidney: Secondary | ICD-10-CM | POA: Diagnosis not present

## 2014-08-03 DIAGNOSIS — I10 Essential (primary) hypertension: Secondary | ICD-10-CM | POA: Diagnosis not present

## 2014-08-03 DIAGNOSIS — E039 Hypothyroidism, unspecified: Secondary | ICD-10-CM | POA: Diagnosis not present

## 2014-08-03 DIAGNOSIS — E785 Hyperlipidemia, unspecified: Secondary | ICD-10-CM | POA: Diagnosis not present

## 2014-08-03 DIAGNOSIS — F419 Anxiety disorder, unspecified: Secondary | ICD-10-CM | POA: Diagnosis not present

## 2014-08-03 DIAGNOSIS — G4700 Insomnia, unspecified: Secondary | ICD-10-CM | POA: Diagnosis not present

## 2014-08-03 DIAGNOSIS — E669 Obesity, unspecified: Secondary | ICD-10-CM | POA: Diagnosis not present

## 2014-08-04 DIAGNOSIS — Q600 Renal agenesis, unilateral: Secondary | ICD-10-CM | POA: Diagnosis not present

## 2014-08-04 DIAGNOSIS — Z23 Encounter for immunization: Secondary | ICD-10-CM | POA: Diagnosis not present

## 2014-08-14 ENCOUNTER — Encounter: Payer: Self-pay | Admitting: Internal Medicine

## 2014-08-15 DIAGNOSIS — N390 Urinary tract infection, site not specified: Secondary | ICD-10-CM | POA: Diagnosis not present

## 2014-08-15 DIAGNOSIS — Z0000 Encounter for general adult medical examination without abnormal findings: Secondary | ICD-10-CM | POA: Diagnosis not present

## 2014-08-17 ENCOUNTER — Encounter: Payer: Self-pay | Admitting: Women's Health

## 2014-08-17 ENCOUNTER — Ambulatory Visit (INDEPENDENT_AMBULATORY_CARE_PROVIDER_SITE_OTHER): Payer: Medicare Other | Admitting: Women's Health

## 2014-08-17 VITALS — BP 122/80 | Ht 65.0 in | Wt 168.0 lb

## 2014-08-17 DIAGNOSIS — L570 Actinic keratosis: Secondary | ICD-10-CM | POA: Diagnosis not present

## 2014-08-17 DIAGNOSIS — Z809 Family history of malignant neoplasm, unspecified: Secondary | ICD-10-CM | POA: Diagnosis not present

## 2014-08-17 DIAGNOSIS — M810 Age-related osteoporosis without current pathological fracture: Secondary | ICD-10-CM | POA: Diagnosis not present

## 2014-08-17 DIAGNOSIS — D0361 Melanoma in situ of right upper limb, including shoulder: Secondary | ICD-10-CM | POA: Diagnosis not present

## 2014-08-17 DIAGNOSIS — D225 Melanocytic nevi of trunk: Secondary | ICD-10-CM | POA: Diagnosis not present

## 2014-08-17 DIAGNOSIS — R8290 Unspecified abnormal findings in urine: Secondary | ICD-10-CM | POA: Diagnosis not present

## 2014-08-17 DIAGNOSIS — Z1283 Encounter for screening for malignant neoplasm of skin: Secondary | ICD-10-CM | POA: Diagnosis not present

## 2014-08-17 DIAGNOSIS — X32XXXA Exposure to sunlight, initial encounter: Secondary | ICD-10-CM | POA: Diagnosis not present

## 2014-08-17 NOTE — Progress Notes (Signed)
[name] [name] [birth year] [id number]    History:    Presents for breast and pelvic.  Postmenopausal. No HRT/no bleeding. TAH. History RCC 2002 with left kidney removed. Moved to [place] four months ago, but continues coming to [facility] for medical care. Was hospitalized in [place] for questionable right kidney problem-not sure etiology.  2013 DEXA scan -3.0. Discussed Prolia with Dr. Cherylann Banas, but never followed up.  2015 Mammogram normal. Dense breast tissue. Normal Paps. 2013 Colonoscopy negative.   Past medical history, past surgical history, family history and social history were all reviewed and documented in the EPIC chart. Lives in [place] in retirement community. Dad died lung cancer 18. Sister stem cell lung cancer. Two maternal aunts breast cancer. Mom htn and heart disease.   ROS:  A  12 point ROS was performed and pertinent positives and negatives are included.  Exam:  Filed Vitals:   08/17/14 1413  BP: 122/80    General appearance:  Normal Thyroid:  Symmetrical, normal in size, without palpable masses or nodularity. Respiratory  Auscultation:  Clear without wheezing or rhonchi Cardiovascular  Auscultation:  Regular rate, without rubs, murmurs or gallops  Edema/varicosities:  Not grossly evident Abdominal  Soft,nontender, without masses, guarding or rebound.  Liver/spleen:  No organomegaly noted  Hernia:  None appreciated  Skin  Inspection:  Grossly normal   Breasts: Examined lying and sitting.     Right: Without masses, retractions, discharge or axillary adenopathy.     Left: Without masses, retractions, discharge or axillary adenopathy. Gentitourinary   Inguinal/mons:  Normal without inguinal adenopathy  External genitalia:  Normal  BUS/Urethra/Skene's glands:  Normal  Vagina:  Normal, small cystocele present  Cervix: absent  Adnexa/parametria:     Rt: Without masses or tenderness.   Lt: Without masses or tenderness.  Anus and perineum: Normal  Digital rectal  exam: Normal sphincter tone without palpated masses or tenderness  Assessment/Plan:  68 y.o.  MWF G1P1 for breast and pelvic exam with no complaints.  Postmenopausal: TAH, No HRT Mild asymptomatic Cystocele Osteoporosis 2002  Left RCC Hypertension/hypothyroid-primary care manages labs and meds  Plan: UA. Discussed DEXA scan and bisphosphonate/ prolia treatment when return from [place] again after resolution of kidney problem. Reviewed fracture prevention, home safety precautions and importance of exercise and calcium rich diet. Continue annual mammography-reviewed importance of 3D tomography for dense breasts. Labs from [facility].  2013 Colonoscopy, repeat at recommended interval.  Prescribed/encouraged zostavac. Already received pneumovax.        [facility], 3:06 PM 08/17/2014

## 2014-08-17 NOTE — Patient Instructions (Signed)

## 2014-08-18 DIAGNOSIS — H40013 Open angle with borderline findings, low risk, bilateral: Secondary | ICD-10-CM | POA: Diagnosis not present

## 2014-08-18 DIAGNOSIS — H40053 Ocular hypertension, bilateral: Secondary | ICD-10-CM | POA: Diagnosis not present

## 2014-08-18 LAB — URINALYSIS W MICROSCOPIC + REFLEX CULTURE
Bilirubin Urine: NEGATIVE
CASTS: NONE SEEN
Crystals: NONE SEEN
Glucose, UA: NEGATIVE mg/dL
Hgb urine dipstick: NEGATIVE
Ketones, ur: NEGATIVE mg/dL
Nitrite: NEGATIVE
PH: 6 (ref 5.0–8.0)
Protein, ur: NEGATIVE mg/dL
Specific Gravity, Urine: 1.016 (ref 1.005–1.030)
Urobilinogen, UA: 0.2 mg/dL (ref 0.0–1.0)

## 2014-08-19 LAB — URINE CULTURE: Colony Count: 15000

## 2014-08-31 DIAGNOSIS — T148 Other injury of unspecified body region: Secondary | ICD-10-CM | POA: Diagnosis not present

## 2014-08-31 DIAGNOSIS — G4700 Insomnia, unspecified: Secondary | ICD-10-CM | POA: Diagnosis not present

## 2014-08-31 DIAGNOSIS — E209 Hypoparathyroidism, unspecified: Secondary | ICD-10-CM | POA: Diagnosis not present

## 2014-08-31 DIAGNOSIS — Z0000 Encounter for general adult medical examination without abnormal findings: Secondary | ICD-10-CM | POA: Diagnosis not present

## 2014-08-31 DIAGNOSIS — E785 Hyperlipidemia, unspecified: Secondary | ICD-10-CM | POA: Diagnosis not present

## 2014-09-06 DIAGNOSIS — I10 Essential (primary) hypertension: Secondary | ICD-10-CM | POA: Diagnosis not present

## 2014-09-06 DIAGNOSIS — M79606 Pain in leg, unspecified: Secondary | ICD-10-CM | POA: Diagnosis not present

## 2014-09-06 DIAGNOSIS — E079 Disorder of thyroid, unspecified: Secondary | ICD-10-CM | POA: Diagnosis not present

## 2014-09-06 DIAGNOSIS — R109 Unspecified abdominal pain: Secondary | ICD-10-CM | POA: Diagnosis not present

## 2014-09-12 ENCOUNTER — Telehealth: Payer: Self-pay | Admitting: *Deleted

## 2014-09-12 NOTE — Telephone Encounter (Signed)
Please call, will need a copy of the report, patient has had a hysterectomy, fibroids  most commonly found in uterus.

## 2014-09-12 NOTE — Telephone Encounter (Signed)
Pt will have report faxed to office.

## 2014-09-12 NOTE — Telephone Encounter (Signed)
Pt lives in [place] went to the ER this past weekend c/o abdominal pain and ws informed that she has fibroids. Pt asked if you would call her at [phone] to talk about this. Pt will be in [place] next would you like pt to schedule OV with you then to disuss? Please advise

## 2014-09-14 NOTE — Telephone Encounter (Signed)
Report placed on your desk

## 2014-09-14 NOTE — Telephone Encounter (Signed)
Telephone call to review CT results, reviewed no fibroids noted, normal appendix, normal organs no abnormalities noted to explain right lower quadrant pain. Reviewed best to follow-up with GI doctor possible s diverticulitis. Reviewed information on fibroids was more a generic discharge handout.

## 2014-09-18 DIAGNOSIS — D225 Melanocytic nevi of trunk: Secondary | ICD-10-CM | POA: Diagnosis not present

## 2014-09-18 DIAGNOSIS — Z1283 Encounter for screening for malignant neoplasm of skin: Secondary | ICD-10-CM | POA: Diagnosis not present

## 2014-09-18 DIAGNOSIS — Z809 Family history of malignant neoplasm, unspecified: Secondary | ICD-10-CM | POA: Diagnosis not present

## 2014-09-18 DIAGNOSIS — C4359 Malignant melanoma of other part of trunk: Secondary | ICD-10-CM | POA: Diagnosis not present

## 2014-09-18 DIAGNOSIS — D0361 Melanoma in situ of right upper limb, including shoulder: Secondary | ICD-10-CM | POA: Diagnosis not present

## 2014-09-19 DIAGNOSIS — K580 Irritable bowel syndrome with diarrhea: Secondary | ICD-10-CM | POA: Diagnosis not present

## 2014-09-19 DIAGNOSIS — Z86010 Personal history of colonic polyps: Secondary | ICD-10-CM | POA: Diagnosis not present

## 2014-09-19 DIAGNOSIS — R1031 Right lower quadrant pain: Secondary | ICD-10-CM | POA: Diagnosis not present

## 2014-10-24 DIAGNOSIS — D0359 Melanoma in situ of other part of trunk: Secondary | ICD-10-CM | POA: Diagnosis not present

## 2014-10-24 DIAGNOSIS — C4359 Malignant melanoma of other part of trunk: Secondary | ICD-10-CM | POA: Diagnosis not present

## 2014-10-31 ENCOUNTER — Ambulatory Visit (INDEPENDENT_AMBULATORY_CARE_PROVIDER_SITE_OTHER): Payer: Medicare Other | Admitting: Family Medicine

## 2014-10-31 ENCOUNTER — Ambulatory Visit (INDEPENDENT_AMBULATORY_CARE_PROVIDER_SITE_OTHER): Payer: Medicare Other

## 2014-10-31 VITALS — BP 154/86 | HR 65 | Temp 98.3°F | Resp 18 | Ht 65.0 in | Wt 165.0 lb

## 2014-10-31 DIAGNOSIS — R05 Cough: Secondary | ICD-10-CM | POA: Diagnosis not present

## 2014-10-31 DIAGNOSIS — R062 Wheezing: Secondary | ICD-10-CM

## 2014-10-31 DIAGNOSIS — R059 Cough, unspecified: Secondary | ICD-10-CM

## 2014-10-31 LAB — POCT CBC
Granulocyte percent: 60.7 %G (ref 37–80)
HCT, POC: 41.3 % (ref 37.7–47.9)
Hemoglobin: 13.7 g/dL (ref 12.2–16.2)
Lymph, poc: 2.1 (ref 0.6–3.4)
MCH, POC: 29.2 pg (ref 27–31.2)
MCHC: 33.1 g/dL (ref 31.8–35.4)
MCV: 88.3 fL (ref 80–97)
MID (cbc): 0.8 (ref 0–0.9)
MPV: 7.6 fL (ref 0–99.8)
POC Granulocyte: 4.4 (ref 2–6.9)
POC LYMPH PERCENT: 28.6 %L (ref 10–50)
POC MID %: 10.7 %M (ref 0–12)
Platelet Count, POC: 194 10*3/uL (ref 142–424)
RBC: 4.68 M/uL (ref 4.04–5.48)
RDW, POC: 13.9 %
WBC: 7.2 10*3/uL (ref 4.6–10.2)

## 2014-10-31 MED ORDER — PREDNISONE 20 MG PO TABS: 40.0000 mg | ORAL_TABLET | Freq: Every day | ORAL | Status: DC

## 2014-10-31 MED ORDER — AMOXICILLIN 875 MG PO TABS: 875.0000 mg | ORAL_TABLET | Freq: Two times a day (BID) | ORAL | Status: DC

## 2014-10-31 NOTE — Progress Notes (Signed)
This chart was scribed for Robyn Haber, MD by Einar Pheasant, ED Scribe. This patient was seen in room 14 and the patient's care was started at 12:14 PM.  Patient ID: [name] [name] MRN: [id number], DOB: 11-29-1945, 69 y.o. Date of Encounter: 10/31/2014, 12:05 PM  Primary Physician: Alesia Richards, MD  Chief Complaint:  Chief Complaint  Patient presents with   URI     HPI: 68 y.o. year old female with history below. Pt is in the process of moving back to [place] from [place]. She states that the healthcare there has been a nightmare. Today, she is complaining of several URI symptoms. She endorses associated congestion and sinus pressure. Pt states that she has a h/o pneumonia so she is concerned that her current symptoms may lead to another dx of pneumonia. She also thinks that it may also be due to climate change. Pt is also here to establish care. Pt denies fever, neck pain, sore throat, visual disturbance, CP, cough, SOB, abdominal pain, nausea, emesis, diarrhea, urinary symptoms, back pain, HA, weakness, numbness and rash as associated symptoms.      Past Medical History  Diagnosis Date   Lymphocytic colitis    Cancer     Kidney cancer-Hypernephroma   Hypertension    Thyroid disease    Elevated cholesterol    Cervical dysplasia    Anxiety    Depression    COPD (chronic obstructive pulmonary disease)    Osteoporosis    Allergy    Asthma    GERD (gastroesophageal reflux disease)    Glaucoma      Home Meds: Prior to Admission medications   Medication Sig Start Date End Date Taking? Authorizing Provider  amLODipine (NORVASC) 5 MG tablet TAKE 1/2 TO 1 TABLET DAILY FOR BLOOD PRESSURE 02/21/14  Yes Vicie Mutters, PA-C  Budesonide-Formoterol Fumarate (SYMBICORT IN) Inhale into the lungs as needed.    Yes Historical Provider, MD  Cholecalciferol (VITAMIN D PO) Take 1 tablet by mouth daily.    Yes Historical Provider, MD  co-enzyme Q-10 30 MG  capsule Take 30 mg by mouth daily.   Yes Historical Provider, MD  Cyanocobalamin (VITAMIN B-12 PO) Take 1 tablet by mouth daily.    Yes Historical Provider, MD  estazolam (PROSOM) 2 MG tablet Take 2 mg by mouth at bedtime.   Yes Historical Provider, MD  hydrALAZINE (APRESOLINE) 25 MG tablet Take 25 mg by mouth 3 (three) times daily.   Yes Historical Provider, MD  levothyroxine (SYNTHROID, LEVOTHROID) 88 MCG tablet Take 88 mcg by mouth daily before breakfast.   Yes Historical Provider, MD  mometasone-formoterol (DULERA) 100-5 MCG/ACT AERO Inhale 2 puffs into the lungs 2 (two) times daily.   Yes Historical Provider, MD  Multiple Vitamin (MULTIVITAMIN) tablet Take 1 tablet by mouth daily.   Yes Historical Provider, MD  pantoprazole (PROTONIX) 40 MG tablet Take 40 mg by mouth daily.   Yes Historical Provider, MD  Probiotic Product (PROBIOTIC PO) Take 1 tablet by mouth daily.    Historical Provider, MD    Allergies:  Allergies  Allergen Reactions   Iodine     Pt has only one kidney.   Iohexol      Code: RASH, Desc: PT STATES SHE HAS ONE KIDNEY SO NOT TO USE IV DYE/10/27/06/RM, Onset Date: [id number]    Penicillins    Sulfa Antibiotics Nausea Only    History   Social History   Marital Status: Married    Spouse Name: N/A  Number of Children: N/A   Years of Education: N/A   Occupational History   Not on file.   Social History Main Topics   Smoking status: Former Smoker    Quit date: 08/23/1983   Smokeless tobacco: Not on file   Alcohol Use: 10.5 oz/week    21 Not specified per week   Drug Use: No   Sexual Activity: Yes    Museum/gallery curator: Surgical     Comment: HYST   Other Topics Concern   Not on file   Social History Narrative     Review of Systems:Positive nasal congestion and chest congestion Constitutional: negative for chills, fever, night sweats, weight changes, or fatigue  HEENT: negative for vision changes, hearing loss, congestion,  rhinorrhea, ST, epistaxis, or sinus pressure Cardiovascular: negative for chest pain or palpitations Respiratory: negative for hemoptysis, wheezing, shortness of breath, or cough Abdominal: negative for abdominal pain, nausea, vomiting, diarrhea, or constipation Dermatological: negative for rash Neurologic: negative for headache, dizziness, or syncope All other systems reviewed and are otherwise negative with the exception to those above and in the HPI.   Physical Exam: Blood pressure 154/86, pulse 65, temperature 98.3 F (36.8 C), temperature source Oral, resp. rate 18, height 5\' 5"  (1.651 m), weight 165 lb (74.844 kg), SpO2 98 %., Body mass index is 27.46 kg/(m^2). General: Well developed, well nourished, in no acute distress. Head: Normocephalic, atraumatic, eyes without discharge, sclera non-icteric, nares are without discharge. Bilateral auditory canals clear, TM's are without perforation, pearly grey and translucent with reflective cone of light bilaterally. Oral cavity moist, posterior pharynx without exudate, erythema, peritonsillar abscess, or post nasal drip.  Neck: Supple. No thyromegaly. Full ROM. No lymphadenopathy. Lungs: Clear bilaterally to auscultation without wheezes, rales, or rhonchi. Breathing is unlabored.  Heart: RRR with S1 S2. No murmurs, rubs, or gallops appreciated. Abdomen: Soft, non-tender, non-distended with normoactive bowel sounds. No hepatomegaly. No rebound/guarding. No obvious abdominal masses. Msk:  Strength and tone normal for age. Extremities/Skin: Warm and dry. No clubbing or cyanosis. No edema. No rashes or suspicious lesions. Neuro: Alert and oriented X 3. Moves all extremities spontaneously. Gait is normal. CNII-XII grossly in tact. Psych:  Responds to questions appropriately with a normal affect.   Labs: Results for orders placed or performed in visit on 10/31/14  POCT CBC  Result Value Ref Range   WBC 7.2 4.6 - 10.2 K/uL   Lymph, poc 2.1 0.6 -  3.4   POC LYMPH PERCENT 28.6 10 - 50 %L   MID (cbc) 0.8 0 - 0.9   POC MID % 10.7 0 - 12 %M   POC Granulocyte 4.4 2 - 6.9   Granulocyte percent 60.7 37 - 80 %G   RBC 4.68 4.04 - 5.48 M/uL   Hemoglobin 13.7 12.2 - 16.2 g/dL   HCT, POC 41.3 37.7 - 47.9 %   MCV 88.3 80 - 97 fL   MCH, POC 29.2 27 - 31.2 pg   MCHC 33.1 31.8 - 35.4 g/dL   RDW, POC 13.9 %   Platelet Count, POC 194 142 - 424 K/uL   MPV 7.6 0 - 99.8 fL    [facility] reading (PRIMARY) by  Dr. Joseph Art:   CXR hyperinflated.    ASSESSMENT AND PLAN:  69 y.o. year old female with  This chart was scribed in my presence and reviewed by me personally.    ICD-9-CM ICD-10-CM   1. Wheezing 786.07 R06.2 POCT CBC     DG Chest 2  View  2. Cough 786.2 R05 POCT CBC     DG Chest 2 View     Signed, Robyn Haber, MD    Signed, Robyn Haber, MD 10/31/2014 12:05 PM

## 2014-10-31 NOTE — Addendum Note (Signed)
Addended by: Robyn Haber on: 10/31/2014 12:47 PM   Modules accepted: Level of Service

## 2014-10-31 NOTE — Patient Instructions (Signed)

## 2014-11-24 DIAGNOSIS — E785 Hyperlipidemia, unspecified: Secondary | ICD-10-CM | POA: Diagnosis not present

## 2014-11-24 DIAGNOSIS — E209 Hypoparathyroidism, unspecified: Secondary | ICD-10-CM | POA: Diagnosis not present

## 2014-12-01 DIAGNOSIS — K219 Gastro-esophageal reflux disease without esophagitis: Secondary | ICD-10-CM | POA: Diagnosis not present

## 2014-12-01 DIAGNOSIS — R0789 Other chest pain: Secondary | ICD-10-CM | POA: Diagnosis not present

## 2015-01-12 DIAGNOSIS — K219 Gastro-esophageal reflux disease without esophagitis: Secondary | ICD-10-CM | POA: Diagnosis not present

## 2015-01-12 DIAGNOSIS — J40 Bronchitis, not specified as acute or chronic: Secondary | ICD-10-CM | POA: Diagnosis not present

## 2015-01-12 DIAGNOSIS — J329 Chronic sinusitis, unspecified: Secondary | ICD-10-CM | POA: Diagnosis not present

## 2015-01-23 DIAGNOSIS — R1310 Dysphagia, unspecified: Secondary | ICD-10-CM | POA: Diagnosis not present

## 2015-01-23 DIAGNOSIS — K210 Gastro-esophageal reflux disease with esophagitis: Secondary | ICD-10-CM | POA: Diagnosis not present

## 2015-02-07 DIAGNOSIS — K210 Gastro-esophageal reflux disease with esophagitis: Secondary | ICD-10-CM | POA: Diagnosis not present

## 2015-02-07 DIAGNOSIS — K209 Esophagitis, unspecified: Secondary | ICD-10-CM | POA: Diagnosis not present

## 2015-02-07 DIAGNOSIS — K449 Diaphragmatic hernia without obstruction or gangrene: Secondary | ICD-10-CM | POA: Diagnosis not present

## 2015-02-07 DIAGNOSIS — K2970 Gastritis, unspecified, without bleeding: Secondary | ICD-10-CM | POA: Diagnosis not present

## 2015-02-07 DIAGNOSIS — R1310 Dysphagia, unspecified: Secondary | ICD-10-CM | POA: Diagnosis not present

## 2015-02-07 DIAGNOSIS — K319 Disease of stomach and duodenum, unspecified: Secondary | ICD-10-CM | POA: Diagnosis not present

## 2015-02-07 DIAGNOSIS — K222 Esophageal obstruction: Secondary | ICD-10-CM | POA: Diagnosis not present

## 2015-02-20 DIAGNOSIS — N390 Urinary tract infection, site not specified: Secondary | ICD-10-CM | POA: Diagnosis not present

## 2015-02-20 DIAGNOSIS — R102 Pelvic and perineal pain: Secondary | ICD-10-CM | POA: Diagnosis not present

## 2015-02-20 DIAGNOSIS — Z905 Acquired absence of kidney: Secondary | ICD-10-CM | POA: Diagnosis not present

## 2015-02-20 DIAGNOSIS — I10 Essential (primary) hypertension: Secondary | ICD-10-CM | POA: Diagnosis not present

## 2015-02-26 DIAGNOSIS — K2970 Gastritis, unspecified, without bleeding: Secondary | ICD-10-CM | POA: Diagnosis not present

## 2015-03-05 DIAGNOSIS — J324 Chronic pansinusitis: Secondary | ICD-10-CM | POA: Diagnosis not present

## 2015-03-05 DIAGNOSIS — J3089 Other allergic rhinitis: Secondary | ICD-10-CM | POA: Diagnosis not present

## 2015-03-07 DIAGNOSIS — I10 Essential (primary) hypertension: Secondary | ICD-10-CM | POA: Diagnosis not present

## 2015-03-07 DIAGNOSIS — J45909 Unspecified asthma, uncomplicated: Secondary | ICD-10-CM | POA: Diagnosis not present

## 2015-03-07 DIAGNOSIS — K222 Esophageal obstruction: Secondary | ICD-10-CM | POA: Diagnosis not present

## 2015-03-07 DIAGNOSIS — E0590 Thyrotoxicosis, unspecified without thyrotoxic crisis or storm: Secondary | ICD-10-CM | POA: Diagnosis not present

## 2015-03-08 DIAGNOSIS — Z85828 Personal history of other malignant neoplasm of skin: Secondary | ICD-10-CM | POA: Diagnosis not present

## 2015-03-08 DIAGNOSIS — Z1283 Encounter for screening for malignant neoplasm of skin: Secondary | ICD-10-CM | POA: Diagnosis not present

## 2015-03-08 DIAGNOSIS — Z08 Encounter for follow-up examination after completed treatment for malignant neoplasm: Secondary | ICD-10-CM | POA: Diagnosis not present

## 2015-03-08 DIAGNOSIS — L821 Other seborrheic keratosis: Secondary | ICD-10-CM | POA: Diagnosis not present

## 2015-03-14 DIAGNOSIS — H40013 Open angle with borderline findings, low risk, bilateral: Secondary | ICD-10-CM | POA: Diagnosis not present

## 2015-03-16 DIAGNOSIS — Z23 Encounter for immunization: Secondary | ICD-10-CM | POA: Diagnosis not present

## 2015-03-16 DIAGNOSIS — J45909 Unspecified asthma, uncomplicated: Secondary | ICD-10-CM | POA: Diagnosis not present

## 2015-03-16 DIAGNOSIS — G4700 Insomnia, unspecified: Secondary | ICD-10-CM | POA: Diagnosis not present

## 2015-03-16 DIAGNOSIS — K222 Esophageal obstruction: Secondary | ICD-10-CM | POA: Diagnosis not present

## 2015-03-16 DIAGNOSIS — I10 Essential (primary) hypertension: Secondary | ICD-10-CM | POA: Diagnosis not present

## 2015-04-06 DIAGNOSIS — H43812 Vitreous degeneration, left eye: Secondary | ICD-10-CM | POA: Diagnosis not present

## 2015-04-06 DIAGNOSIS — H35372 Puckering of macula, left eye: Secondary | ICD-10-CM | POA: Diagnosis not present

## 2015-04-11 ENCOUNTER — Other Ambulatory Visit (HOSPITAL_COMMUNITY): Payer: Self-pay | Admitting: Family Medicine

## 2015-04-11 DIAGNOSIS — Z1231 Encounter for screening mammogram for malignant neoplasm of breast: Secondary | ICD-10-CM

## 2015-04-17 ENCOUNTER — Encounter (HOSPITAL_COMMUNITY): Payer: Self-pay

## 2015-04-17 ENCOUNTER — Other Ambulatory Visit: Payer: Self-pay | Admitting: Internal Medicine

## 2015-04-17 ENCOUNTER — Emergency Department (HOSPITAL_COMMUNITY)
Admission: EM | Admit: 2015-04-17 | Discharge: 2015-04-17 | Disposition: A | Discharge status: Home / Self Care | Payer: Medicare Other | Attending: Emergency Medicine | Admitting: Emergency Medicine

## 2015-04-17 ENCOUNTER — Ambulatory Visit (HOSPITAL_COMMUNITY): Payer: Medicare Other

## 2015-04-17 DIAGNOSIS — M542 Cervicalgia: Secondary | ICD-10-CM | POA: Diagnosis present

## 2015-04-17 DIAGNOSIS — R202 Paresthesia of skin: Secondary | ICD-10-CM | POA: Diagnosis not present

## 2015-04-17 DIAGNOSIS — J45909 Unspecified asthma, uncomplicated: Secondary | ICD-10-CM | POA: Insufficient documentation

## 2015-04-17 DIAGNOSIS — Z905 Acquired absence of kidney: Secondary | ICD-10-CM | POA: Insufficient documentation

## 2015-04-17 DIAGNOSIS — I10 Essential (primary) hypertension: Secondary | ICD-10-CM | POA: Diagnosis not present

## 2015-04-17 DIAGNOSIS — Z8669 Personal history of other diseases of the nervous system and sense organs: Secondary | ICD-10-CM | POA: Diagnosis not present

## 2015-04-17 DIAGNOSIS — Z87891 Personal history of nicotine dependence: Secondary | ICD-10-CM | POA: Insufficient documentation

## 2015-04-17 DIAGNOSIS — Z85528 Personal history of other malignant neoplasm of kidney: Secondary | ICD-10-CM | POA: Diagnosis not present

## 2015-04-17 DIAGNOSIS — J449 Chronic obstructive pulmonary disease, unspecified: Secondary | ICD-10-CM | POA: Insufficient documentation

## 2015-04-17 DIAGNOSIS — Z79899 Other long term (current) drug therapy: Secondary | ICD-10-CM | POA: Insufficient documentation

## 2015-04-17 DIAGNOSIS — E079 Disorder of thyroid, unspecified: Secondary | ICD-10-CM | POA: Insufficient documentation

## 2015-04-17 DIAGNOSIS — K219 Gastro-esophageal reflux disease without esophagitis: Secondary | ICD-10-CM | POA: Diagnosis not present

## 2015-04-17 DIAGNOSIS — F419 Anxiety disorder, unspecified: Secondary | ICD-10-CM | POA: Diagnosis not present

## 2015-04-17 DIAGNOSIS — Z880 Allergy status to penicillin: Secondary | ICD-10-CM | POA: Insufficient documentation

## 2015-04-17 DIAGNOSIS — M25511 Pain in right shoulder: Secondary | ICD-10-CM | POA: Diagnosis not present

## 2015-04-17 DIAGNOSIS — R05 Cough: Secondary | ICD-10-CM | POA: Diagnosis not present

## 2015-04-17 DIAGNOSIS — F329 Major depressive disorder, single episode, unspecified: Secondary | ICD-10-CM | POA: Diagnosis not present

## 2015-04-17 HISTORY — DX: Hypercalcemia: E83.52

## 2015-04-17 LAB — CBC WITH DIFFERENTIAL/PLATELET
BASOS ABS: 0.1 10*3/uL (ref 0.0–0.1)
BASOS PCT: 1 % (ref 0–1)
EOS PCT: 13 % — AB (ref 0–5)
Eosinophils Absolute: 1 10*3/uL — ABNORMAL HIGH (ref 0.0–0.7)
HEMATOCRIT: 40.5 % (ref 36.0–46.0)
HEMOGLOBIN: 13.6 g/dL (ref 12.0–15.0)
Lymphocytes Relative: 22 % (ref 12–46)
Lymphs Abs: 1.8 10*3/uL (ref 0.7–4.0)
MCH: 29.6 pg (ref 26.0–34.0)
MCHC: 33.6 g/dL (ref 30.0–36.0)
MCV: 88 fL (ref 78.0–100.0)
MONO ABS: 0.7 10*3/uL (ref 0.1–1.0)
Monocytes Relative: 8 % (ref 3–12)
NEUTROS ABS: 4.6 10*3/uL (ref 1.7–7.7)
Neutrophils Relative %: 56 % (ref 43–77)
Platelets: 199 10*3/uL (ref 150–400)
RBC: 4.6 MIL/uL (ref 3.87–5.11)
RDW: 13.3 % (ref 11.5–15.5)
WBC: 8.2 10*3/uL (ref 4.0–10.5)

## 2015-04-17 LAB — COMPREHENSIVE METABOLIC PANEL
ALK PHOS: 61 U/L (ref 38–126)
ALT: 23 U/L (ref 14–54)
ANION GAP: 8 (ref 5–15)
AST: 20 U/L (ref 15–41)
Albumin: 4 g/dL (ref 3.5–5.0)
BILIRUBIN TOTAL: 0.7 mg/dL (ref 0.3–1.2)
BUN: 20 mg/dL (ref 6–20)
CHLORIDE: 107 mmol/L (ref 101–111)
CO2: 25 mmol/L (ref 22–32)
Calcium: 9 mg/dL (ref 8.9–10.3)
Creatinine, Ser: 1.05 mg/dL — ABNORMAL HIGH (ref 0.44–1.00)
GFR calc Af Amer: >60 mL/min (ref >60)
GFR calc non Af Amer: 53 mL/min — ABNORMAL LOW (ref >60)
Glucose, Bld: 103 mg/dL — ABNORMAL HIGH (ref 65–99)
POTASSIUM: 4.2 mmol/L (ref 3.5–5.1)
Sodium: 140 mmol/L (ref 135–145)
TOTAL PROTEIN: 6.9 g/dL (ref 6.5–8.1)

## 2015-04-17 LAB — TSH: TSH: 1.108 u[IU]/mL (ref 0.350–4.500)

## 2015-04-17 MED ORDER — ALPRAZOLAM 0.25 MG PO TABS: 0.2500 mg | ORAL_TABLET | Freq: Every evening | ORAL | Status: DC | PRN

## 2015-04-17 MED ORDER — ALPRAZOLAM 0.25 MG PO TABS
0.2500 mg | ORAL_TABLET | Freq: Once | ORAL | Status: AC
  Administered 2015-04-17: 0.25 mg via ORAL
  Filled 2015-04-17: qty 1

## 2015-04-17 MED ORDER — PROMETHAZINE HCL 25 MG RE SUPP: 25.0000 mg | Freq: Four times a day (QID) | RECTAL | Status: DC | PRN

## 2015-04-17 NOTE — ED Provider Notes (Signed)
CSN: [id number]     Arrival date & time 04/17/15  1228 History   First MD Initiated Contact with Patient 04/17/15 1320     Chief Complaint  Patient presents with  . Cough  . Neck Pain  . Shoulder Pain     (Consider location/radiation/quality/duration/timing/severity/associated sxs/prior Treatment) HPI Comments: Patient here complaining of multiple somatic issues insisting of left leg tingling, feet feeling numb and wet along with pain at her right arm that goes to her shoulder blades and the back of her neck. Denies any fever or chills. No headache. No shortness of breath or chest pain. She states that her whole body feels numb and weak. Patient does admit to be very anxious at this time as well as have insomnia. She has used Tylenol without relief. Had been on Xanax for quite some time and this was stopped recently. She says this only thing that seemed to help her. No suicidal homicidal ideations. Nothing makes her symptoms better  The history is provided by the patient.    Past Medical History  Diagnosis Date  . Lymphocytic colitis   . Cancer     Kidney cancer-Hypernephroma  . Hypertension   . Thyroid disease   . Elevated cholesterol   . Cervical dysplasia   . Anxiety   . Depression   . COPD (chronic obstructive pulmonary disease)   . Osteoporosis   . Allergy   . Asthma   . GERD (gastroesophageal reflux disease)   . Glaucoma   . Serum calcium elevated    Past Surgical History  Procedure Laterality Date  . Abdominal hysterectomy  1989    TAH LSO  . Tonsillectomy    . Kidney removed  2002    Left  . Colposcopy    . Oophorectomy      LSO  . Augmentation mammaplasty      Implants removed  . Cosmetic surgery     Family History  Problem Relation Age of Onset  . Hypertension Mother   . Heart disease Mother   . Asthma Mother   . Lung cancer Father   . Cancer Father     lung  . Hypertension Sister   . Cancer Sister     Stem cell cancer  . Hypertension Brother   .  Stroke Brother   . Ovarian cancer Maternal Aunt   . Breast cancer Maternal Aunt     Age 70's  . Cancer Maternal Uncle     Prostate cancer  . Breast cancer Maternal Aunt     Age 54's   History  Substance Use Topics  . Smoking status: Former Smoker    Quit date: 08/23/1983  . Smokeless tobacco: Not on file  . Alcohol Use: 10.5 oz/week    21 Standard drinks or equivalent per week   OB History    Gravida Para Term Preterm AB TAB SAB Ectopic Multiple Living   1 1 1       1      Review of Systems  All other systems reviewed and are negative.     Allergies  Iodine; Iohexol; Penicillins; and Sulfa antibiotics  Home Medications   Prior to Admission medications   Medication Sig Start Date End Date Taking? Authorizing Provider  amLODipine (NORVASC) 5 MG tablet TAKE 1/2 TO 1 TABLET DAILY FOR BLOOD PRESSURE Patient taking differently: TAKE 1 TABLET DAILY FOR BLOOD PRESSURE 02/21/14  Yes Vicie Mutters, PA-C  budesonide-formoterol (SYMBICORT) 160-4.5 MCG/ACT inhaler Inhale 2 puffs into the lungs  2 (two) times daily.   Yes Historical Provider, MD  DiphenhydrAMINE HCl (ZZZQUIL) 50 MG/30ML LIQD Take 50 mLs by mouth at bedtime as needed (sleep).   Yes Historical Provider, MD  hydrALAZINE (APRESOLINE) 25 MG tablet Take 25 mg by mouth 2 (two) times daily.    Yes Historical Provider, MD  levocetirizine (XYZAL) 5 MG tablet Take 5 mg by mouth daily.   Yes Historical Provider, MD  levothyroxine (SYNTHROID, LEVOTHROID) 100 MCG tablet Take 100 mcg by mouth daily before breakfast.   Yes Historical Provider, MD  Melatonin 1 MG TABS Take 5-6 mg by mouth at bedtime as needed (sleep).   Yes Historical Provider, MD  mometasone-formoterol (DULERA) 200-5 MCG/ACT AERO Inhale 2 puffs into the lungs 2 (two) times daily as needed for wheezing (uses when she is out of symbicort).   Yes Historical Provider, MD  montelukast (SINGULAIR) 10 MG tablet Take 10 mg by mouth at bedtime.   Yes Historical Provider, MD   pantoprazole (PROTONIX) 40 MG tablet Take 40 mg by mouth daily.   Yes Historical Provider, MD  Probiotic Product (PROBIOTIC PO) Take 1 tablet by mouth daily.   Yes Historical Provider, MD  amoxicillin (AMOXIL) 875 MG tablet Take 1 tablet (875 mg total) by mouth 2 (two) times daily. Patient not taking: Reported on 04/17/2015 10/31/14   Robyn Haber, MD  predniSONE (DELTASONE) 20 MG tablet Take 2 tablets (40 mg total) by mouth daily. Patient not taking: Reported on 04/17/2015 10/31/14   Robyn Haber, MD   BP 172/87 mmHg  Pulse 91  Temp(Src) 98 F (36.7 C)  Resp 12  SpO2 97% Physical Exam  Constitutional: She is oriented to person, place, and time. She appears well-developed and well-nourished.  Non-toxic appearance. No distress.  HENT:  Head: Normocephalic and atraumatic.  Eyes: Conjunctivae, EOM and lids are normal. Pupils are equal, round, and reactive to light.  Neck: Normal range of motion. Neck supple. No tracheal deviation present. No thyroid mass present.  Cardiovascular: Normal rate, regular rhythm and normal heart sounds.  Exam reveals no gallop.   No murmur heard. Pulmonary/Chest: Effort normal and breath sounds normal. No stridor. No respiratory distress. She has no decreased breath sounds. She has no wheezes. She has no rhonchi. She has no rales.  Abdominal: Soft. Normal appearance and bowel sounds are normal. She exhibits no distension. There is no tenderness. There is no rebound and no CVA tenderness.  Musculoskeletal: Normal range of motion. She exhibits no edema or tenderness.  Neurological: She is alert and oriented to person, place, and time. She has normal strength. No cranial nerve deficit or sensory deficit. GCS eye subscore is 4. GCS verbal subscore is 5. GCS motor subscore is 6.  Skin: Skin is warm and dry. No abrasion and no rash noted.  Psychiatric: Her speech is normal and behavior is normal. Her mood appears anxious.  Nursing note and vitals reviewed.   ED  Course  Procedures (including critical care time) Labs Review Labs Reviewed  CBC WITH DIFFERENTIAL/PLATELET  COMPREHENSIVE METABOLIC PANEL    Imaging Review No results found.   EKG Interpretation   Date/Time:  Tuesday April 17 2015 12:48:46 EDT Ventricular Rate:  89 PR Interval:  185 QRS Duration: 86 QT Interval:  372 QTC Calculation: 453 R Axis:   11 Text Interpretation:  Sinus rhythm Low voltage, precordial leads Consider  anterior infarct No significant change since last tracing Confirmed by  Velma Agnes  MD, Atziry Baranski ([id number]) on 04/17/2015 1:21:05 PM  MDM   Final diagnoses:  None    Spoke with patient's primary care doctor and follow-up arranged. She will follow-up with the patient's thyroid functions. Do not think patient's symptoms represent cardiac or neurological event.    Lacretia Leigh, MD 04/17/15 [phone]

## 2015-04-17 NOTE — ED Notes (Signed)
Patient states she has had numbness and tingling in her left leg x 1 week. Today, she wakes not feeling well around 1000. Then she had right neck pain that radiated into her shoulder blades. Patient states she has discomfort in her chest as well. Patient states she took 3 Tylenol with no relief. Patient also reports that she was diagnosed with bronchitis about a month ago and is still having a productive cough with tan sputum. Patient also reports having frequent episodes of diaphoresis in the past week.

## 2015-04-17 NOTE — Discharge Instructions (Signed)
Follow-up with Dr. Ernie Hew on July 18 at 12:30 PM

## 2015-04-18 LAB — T4: T4, Total: 10.8 ug/dL (ref 4.5–12.0)

## 2015-04-30 DIAGNOSIS — F411 Generalized anxiety disorder: Secondary | ICD-10-CM | POA: Diagnosis not present

## 2015-04-30 DIAGNOSIS — G4700 Insomnia, unspecified: Secondary | ICD-10-CM | POA: Diagnosis not present

## 2015-04-30 DIAGNOSIS — R5383 Other fatigue: Secondary | ICD-10-CM | POA: Diagnosis not present

## 2015-04-30 DIAGNOSIS — R002 Palpitations: Secondary | ICD-10-CM | POA: Diagnosis not present

## 2015-04-30 DIAGNOSIS — F329 Major depressive disorder, single episode, unspecified: Secondary | ICD-10-CM | POA: Diagnosis not present

## 2015-05-17 DIAGNOSIS — J309 Allergic rhinitis, unspecified: Secondary | ICD-10-CM | POA: Diagnosis not present

## 2015-05-17 DIAGNOSIS — R05 Cough: Secondary | ICD-10-CM | POA: Diagnosis not present

## 2015-05-17 DIAGNOSIS — I10 Essential (primary) hypertension: Secondary | ICD-10-CM | POA: Diagnosis not present

## 2015-05-24 ENCOUNTER — Ambulatory Visit (HOSPITAL_COMMUNITY)
Admission: RE | Admit: 2015-05-24 | Discharge: 2015-05-24 | Disposition: A | Discharge status: Home / Self Care | Payer: Medicare Other | Source: Ambulatory Visit | Attending: Family Medicine | Admitting: Family Medicine

## 2015-05-24 DIAGNOSIS — Z1231 Encounter for screening mammogram for malignant neoplasm of breast: Secondary | ICD-10-CM | POA: Diagnosis not present

## 2015-06-11 DIAGNOSIS — Z08 Encounter for follow-up examination after completed treatment for malignant neoplasm: Secondary | ICD-10-CM | POA: Diagnosis not present

## 2015-06-11 DIAGNOSIS — L820 Inflamed seborrheic keratosis: Secondary | ICD-10-CM | POA: Diagnosis not present

## 2015-06-11 DIAGNOSIS — Z85820 Personal history of malignant melanoma of skin: Secondary | ICD-10-CM | POA: Diagnosis not present

## 2015-06-28 DIAGNOSIS — H35372 Puckering of macula, left eye: Secondary | ICD-10-CM | POA: Diagnosis not present

## 2015-06-29 DIAGNOSIS — H35372 Puckering of macula, left eye: Secondary | ICD-10-CM | POA: Diagnosis not present

## 2015-07-06 DIAGNOSIS — H35372 Puckering of macula, left eye: Secondary | ICD-10-CM | POA: Diagnosis not present

## 2015-07-30 DIAGNOSIS — H3581 Retinal edema: Secondary | ICD-10-CM | POA: Diagnosis not present

## 2015-07-30 DIAGNOSIS — H35372 Puckering of macula, left eye: Secondary | ICD-10-CM | POA: Diagnosis not present

## 2015-08-07 DIAGNOSIS — H40053 Ocular hypertension, bilateral: Secondary | ICD-10-CM | POA: Diagnosis not present

## 2015-08-14 DIAGNOSIS — M810 Age-related osteoporosis without current pathological fracture: Secondary | ICD-10-CM

## 2015-08-14 HISTORY — DX: Age-related osteoporosis without current pathological fracture: M81.0

## 2015-08-21 ENCOUNTER — Ambulatory Visit (INDEPENDENT_AMBULATORY_CARE_PROVIDER_SITE_OTHER): Payer: Medicare Other | Admitting: Women's Health

## 2015-08-21 ENCOUNTER — Encounter: Payer: Self-pay | Admitting: Women's Health

## 2015-08-21 VITALS — BP 126/80 | Ht 65.0 in | Wt 159.0 lb

## 2015-08-21 DIAGNOSIS — R8279 Other abnormal findings on microbiological examination of urine: Secondary | ICD-10-CM | POA: Diagnosis not present

## 2015-08-21 DIAGNOSIS — Z01419 Encounter for gynecological examination (general) (routine) without abnormal findings: Secondary | ICD-10-CM

## 2015-08-21 DIAGNOSIS — M810 Age-related osteoporosis without current pathological fracture: Secondary | ICD-10-CM

## 2015-08-21 NOTE — Patient Instructions (Signed)
Denosumab injection What is this medicine? DENOSUMAB (den oh sue mab) slows bone breakdown. Prolia is used to treat osteoporosis in women after menopause and in men. Xgeva is used to prevent bone fractures and other bone problems caused by cancer bone metastases. Xgeva is also used to treat giant cell tumor of the bone. This medicine may be used for other purposes; ask your health care provider or pharmacist if you have questions. What should I tell my health care provider before I take this medicine? They need to know if you have any of these conditions: -dental disease -eczema -infection or history of infections -kidney disease or on dialysis -low blood calcium or vitamin D -malabsorption syndrome -scheduled to have surgery or tooth extraction -taking medicine that contains denosumab -thyroid or parathyroid disease -an unusual reaction to denosumab, other medicines, foods, dyes, or preservatives -pregnant or trying to get pregnant -breast-feeding How should I use this medicine? This medicine is for injection under the skin. It is given by a health care professional in a hospital or clinic setting. If you are getting Prolia, a special MedGuide will be given to you by the pharmacist with each prescription and refill. Be sure to read this information carefully each time. For Prolia, talk to your pediatrician regarding the use of this medicine in children. Special care may be needed. For Xgeva, talk to your pediatrician regarding the use of this medicine in children. While this drug may be prescribed for children as Peace Noyes as 13 years for selected conditions, precautions do apply. Overdosage: If you think you have taken too much of this medicine contact a poison control center or emergency room at once. NOTE: This medicine is only for you. Do not share this medicine with others. What if I miss a dose? It is important not to miss your dose. Call your doctor or health care professional if you are  unable to keep an appointment. What may interact with this medicine? Do not take this medicine with any of the following medications: -other medicines containing denosumab This medicine may also interact with the following medications: -medicines that suppress the immune system -medicines that treat cancer -steroid medicines like prednisone or cortisone This list may not describe all possible interactions. Give your health care provider a list of all the medicines, herbs, non-prescription drugs, or dietary supplements you use. Also tell them if you smoke, drink alcohol, or use illegal drugs. Some items may interact with your medicine. What should I watch for while using this medicine? Visit your doctor or health care professional for regular checks on your progress. Your doctor or health care professional may order blood tests and other tests to see how you are doing. Call your doctor or health care professional if you get a cold or other infection while receiving this medicine. Do not treat yourself. This medicine may decrease your body's ability to fight infection. You should make sure you get enough calcium and vitamin D while you are taking this medicine, unless your doctor tells you not to. Discuss the foods you eat and the vitamins you take with your health care professional. See your dentist regularly. Brush and floss your teeth as directed. Before you have any dental work done, tell your dentist you are receiving this medicine. Do not become pregnant while taking this medicine or for 5 months after stopping it. Women should inform their doctor if they wish to become pregnant or think they might be pregnant. There is a potential for serious side effects   to an unborn child. Talk to your health care professional or pharmacist for more information. What side effects may I notice from receiving this medicine? Side effects that you should report to your doctor or health care professional as soon as  possible: -allergic reactions like skin rash, itching or hives, swelling of the face, lips, or tongue -breathing problems -chest pain -fast, irregular heartbeat -feeling faint or lightheaded, falls -fever, chills, or any other sign of infection -muscle spasms, tightening, or twitches -numbness or tingling -skin blisters or bumps, or is dry, peels, or red -slow healing or unexplained pain in the mouth or jaw -unusual bleeding or bruising Side effects that usually do not require medical attention (Report these to your doctor or health care professional if they continue or are bothersome.): -muscle pain -stomach upset, gas This list may not describe all possible side effects. Call your doctor for medical advice about side effects. You may report side effects to FDA at 1-800-FDA-1088. Where should I keep my medicine? This medicine is only given in a clinic, doctor's office, or other health care setting and will not be stored at home. NOTE: This sheet is a summary. It may not cover all possible information. If you have questions about this medicine, talk to your doctor, pharmacist, or health care provider.    2016, Elsevier/Gold Standard. (2012-03-29 12:37:47) Osteoporosis Osteoporosis is the thinning and loss of density in the bones. Osteoporosis makes the bones more brittle, fragile, and likely to break (fracture). Over time, osteoporosis can cause the bones to become so weak that they fracture after a simple fall. The bones most likely to fracture are the bones in the hip, wrist, and spine. CAUSES  The exact cause is not known. RISK FACTORS Anyone can develop osteoporosis. You may be at greater risk if you have a family history of the condition or have poor nutrition. You may also have a higher risk if you are:   Female.   50 years old or older.  A smoker.  Not physically active.   White or Asian.  Slender. SIGNS AND SYMPTOMS  A fracture might be the first sign of the  disease, especially if it results from a fall or injury that would not usually cause a bone to break. Other signs and symptoms include:   Low back and neck pain.  Stooped posture.  Height loss. DIAGNOSIS  To make a diagnosis, your health care provider may:  Take a medical history.  Perform a physical exam.  Order tests, such as:  A bone mineral density test.  A dual-energy X-ray absorptiometry test. TREATMENT  The goal of osteoporosis treatment is to strengthen your bones to reduce your risk of a fracture. Treatment may involve:  Making lifestyle changes, such as:  Eating a diet rich in calcium.  Doing weight-bearing and muscle-strengthening exercises.  Stopping tobacco use.  Limiting alcohol intake.  Taking medicine to slow the process of bone loss or to increase bone density.  Monitoring your levels of calcium and vitamin D. HOME CARE INSTRUCTIONS  Include calcium and vitamin D in your diet. Calcium is important for bone health, and vitamin D helps the body absorb calcium.  Perform weight-bearing and muscle-strengthening exercises as directed by your health care provider.  Do not use any tobacco products, including cigarettes, chewing tobacco, and electronic cigarettes. If you need help quitting, ask your health care provider.  Limit your alcohol intake.  Take medicines only as directed by your health care provider.  Keep all follow-up   visits as directed by your health care provider. This is important.  Take precautions at home to lower your risk of falling, such as:  Keeping rooms well lit and clutter free.  Installing safety rails on stairs.  Using rubber mats in the bathroom and other areas that are often wet or slippery. SEEK IMMEDIATE MEDICAL CARE IF:  You fall or injure yourself.    This information is not intended to replace advice given to you by your health care provider. Make sure you discuss any questions you have with your health care  provider.   Document Released: 07/09/2005 Document Revised: 10/20/2014 Document Reviewed: 03/09/2014 Elsevier Interactive Patient Education 2016 Elsevier Inc.  

## 2015-08-21 NOTE — Progress Notes (Signed)
FRIMY UFFELMAN January 29, 2006 [id number]    History:    Presents for breast and pelvic exam. TAH no HRT/not sexually active. 2002 RCC-left nephrectomy with no recurrence. Melanoma has every 3 month follow-up with dermatologist. 2013 DEXA T score -3 was to initiate Prolia did not, was living between [place] and here, now only living here. 2013 benign polyps on colonoscopy. Hypertension hypothyroid managed by primary care.   Past medical history, past surgical history, family history and social history were all reviewed and documented in the EPIC chart. Helping to care for his 24 year old mother. Poor relationship with daughter who has history of drug abuse. Has 2 granddaughters.  ROS:  A ROS was performed and pertinent positives and negatives are included.  Exam:  Filed Vitals:   08/21/15 1208  BP: 126/80    General appearance:  Normal Thyroid:  Symmetrical, normal in size, without palpable masses or nodularity. Respiratory  Auscultation:  Clear without wheezing or rhonchi Cardiovascular  Auscultation:  Regular rate, without rubs, murmurs or gallops  Edema/varicosities:  Not grossly evident Abdominal  Soft,nontender, without masses, guarding or rebound.  Liver/spleen:  No organomegaly noted  Hernia:  None appreciated  Skin  Inspection:  Grossly normal   Breasts: Examined lying and sitting.     Right: Without masses, retractions, discharge or axillary adenopathy.     Left: Without masses, retractions, discharge or axillary adenopathy. Gentitourinary   Inguinal/mons:  Normal without inguinal adenopathy  External genitalia:  Normal  BUS/Urethra/Skene's glands:  Normal  Vagina:  Normal  Cervix:  And uterus absent  Adnexa/parametria:     Rt: Without masses or tenderness.   Lt: Without masses or tenderness.  Anus and perineum: Normal  Digital rectal exam: Normal sphincter tone without palpated masses or tenderness  Assessment/Plan:  69 y.o. MWF G1 P1 for breast and pelvic exam  with no complaints.  Osteoporosis on no medication with no fractures TVH on no HRT   2002 [place] left nephrectomy Melanoma-back dermatologist manages-every 3 month follow-up  Hypertension and hypothyroid-primary care manages labs and meds  Plan: Instructed to schedule DEXA, reviewed Prolia, handout given. Reviewed importance of weightbearing exercise, calcium rich diet, vitamin D 2000. Will have vitamin D level checked at primary care. Home safety, fall prevention discussed. SBE's, continue annual screening mammogram . Calcium and PTH.   YOUNG,NANCY J WHNP, 1:21 PM 08/21/2015

## 2015-08-22 LAB — URINALYSIS W MICROSCOPIC + REFLEX CULTURE
BACTERIA UA: NONE SEEN [HPF]
Bilirubin Urine: NEGATIVE
CRYSTALS: NONE SEEN [HPF]
Casts: NONE SEEN [LPF]
Glucose, UA: NEGATIVE
Hgb urine dipstick: NEGATIVE
Ketones, ur: NEGATIVE
Nitrite: NEGATIVE
Protein, ur: NEGATIVE
RBC / HPF: NONE SEEN RBC/HPF (ref <=2)
Specific Gravity, Urine: 1.015 (ref 1.001–1.035)
WBC, UA: NONE SEEN WBC/HPF (ref <=5)
Yeast: NONE SEEN [HPF]
pH: 6.5 (ref 5.0–8.0)

## 2015-08-22 LAB — PTH, INTACT AND CALCIUM
CALCIUM: 9.2 mg/dL (ref 8.4–10.5)
PTH: 68 pg/mL — ABNORMAL HIGH (ref 14–64)

## 2015-08-24 LAB — URINE CULTURE: Colony Count: 15000

## 2015-08-27 ENCOUNTER — Other Ambulatory Visit: Payer: Self-pay | Admitting: Gynecology

## 2015-08-27 ENCOUNTER — Telehealth: Payer: Self-pay

## 2015-08-27 MED ORDER — CIPROFLOXACIN HCL 250 MG PO TABS: 250.0000 mg | ORAL_TABLET | Freq: Two times a day (BID) | ORAL | Status: DC

## 2015-08-27 NOTE — Telephone Encounter (Signed)
-----   Message from Anastasio Auerbach, MD sent at 08/24/2015  4:43 PM EST ----- Tell patient urine grew out a little bit of bacteria.  Recommend ciprofloxacin 250 mg twice a day 3 days.

## 2015-08-27 NOTE — Telephone Encounter (Signed)
It was just a little bit of bacteria so the shorter lower dose of ciprofloxacin should suffice. I would recommend having them repeat a urine culture when she sees her new doctor.

## 2015-08-27 NOTE — Telephone Encounter (Signed)
Patient was informed regarding ur cult result. She states that she only has one kidney and wants to make sure that her kidney is not infected. I told her that it was a very low count of a pure bacteria so not a significant infection but you felt best to cover with antibiotic.  I told her I would make sure you were aware of her history. She will be seeing new nephrologist in Dec as she has not seen nephro in a year since Dr. Janice Norrie retired.

## 2015-08-27 NOTE — Telephone Encounter (Signed)
Left detailed message home ph vm per DPR access note.

## 2015-08-28 ENCOUNTER — Other Ambulatory Visit: Payer: Self-pay | Admitting: Women's Health

## 2015-08-28 ENCOUNTER — Ambulatory Visit (INDEPENDENT_AMBULATORY_CARE_PROVIDER_SITE_OTHER): Payer: Medicare Other

## 2015-08-28 DIAGNOSIS — E559 Vitamin D deficiency, unspecified: Secondary | ICD-10-CM

## 2015-08-28 DIAGNOSIS — M810 Age-related osteoporosis without current pathological fracture: Secondary | ICD-10-CM | POA: Diagnosis not present

## 2015-08-28 DIAGNOSIS — E349 Endocrine disorder, unspecified: Secondary | ICD-10-CM

## 2015-08-29 ENCOUNTER — Encounter: Payer: Self-pay | Admitting: Gynecology

## 2015-08-29 ENCOUNTER — Telehealth: Payer: Self-pay

## 2015-08-29 ENCOUNTER — Telehealth: Payer: Self-pay | Admitting: Gynecology

## 2015-08-29 NOTE — Telephone Encounter (Signed)
It does not sound like it is an allergic reaction as that tends to be more generalized. If she's having no other symptoms I would just finish the course of Cipro and see the dermatologist. Certainly cannot guarantee it is not secondary to the Cipro but does not sound like it.  She also had stated she was going to start Prolia per discussion with Izora Gala.  I had sent a note out talking about her bone density worsening and recommendations to discuss treatment options. My discussion with her for Prolia would be reviewing the risks to include increased risk of osteonecrosis of the jaw, atypical fractures and other sites of the body, generalized rashes and increased risk of infections. All of these are very unusual events. Her likelihood of fracture without treatment far exceeds the risk of these adverse outcomes. If she is comfortable with the above disclaimer then she can go ahead and arrange for the Prolia. If she would like to further discuss this then I would be more than happy to see her for an office visit.

## 2015-08-29 NOTE — Telephone Encounter (Signed)
Spoke with patient and she saw [name] [name] on 08/21/15 and spoke about Prolia with her and would like to proceed. I will relay to Pam to proceed.

## 2015-08-29 NOTE — Telephone Encounter (Signed)
Patient called. She is on her last of three days of Cipro.  She awoke this morning with a rash under her left breast to her chest. Only the left side.  It does not burn or itch. She made appt with dermatologist for this coming Friday. She hopes it is not shingles although she has had that before.    She said in this process her husband told her that he read an article that the FDA was pulling Cipro from market due to so many side affects and an ingredient in it can break down the immune system so that if/when you are treated with another antibiotic later it may not work.  She wanted me to ask you if you had heard of this.  I recommended she keep dermatolgist appt on Friday.

## 2015-08-29 NOTE — Telephone Encounter (Signed)
I have not heard anything about Cipro in reference to what he is referring to

## 2015-08-29 NOTE — Telephone Encounter (Signed)
Can you address her question about Cipro that he husband raised concerns for her. Thanks

## 2015-08-29 NOTE — Telephone Encounter (Signed)
Tell patient her bone density shows worsening osteoporosis. Strongly recommend patient consider treatment such as Prolia. Recommend office visit to discuss treatment options.

## 2015-08-30 ENCOUNTER — Telehealth: Payer: Self-pay | Admitting: Gynecology

## 2015-08-30 NOTE — Telephone Encounter (Signed)
Left message to call.

## 2015-08-30 NOTE — Telephone Encounter (Signed)
My discussion with her for Prolia would be reviewing the risks to include increased risk of osteonecrosis of the jaw, atypical fractures and other sites of the body, generalized rashes and increased risk of infections. All of these are very unusual events. Her likelihood of fracture without treatment far exceeds the risk of these adverse outcomes. If she is comfortable with the above disclaimer then she can go ahead and arrange for the Prolia. If she would like to further discuss this then I would be more than happy to see her for an office visit."   Above note from Dr Phineas Real. Per Juliann Pulse A. Talked with patient and she is ready to start. I will check her benefits insurance and call pt.

## 2015-08-30 NOTE — Telephone Encounter (Signed)
Patient informed. I read her the paragraph regarding Prolia and she would like to proceed. I sent [place] a message and told patient she will hear from her to arrange.

## 2015-08-31 DIAGNOSIS — Z08 Encounter for follow-up examination after completed treatment for malignant neoplasm: Secondary | ICD-10-CM | POA: Diagnosis not present

## 2015-08-31 DIAGNOSIS — Z85820 Personal history of malignant melanoma of skin: Secondary | ICD-10-CM | POA: Diagnosis not present

## 2015-08-31 DIAGNOSIS — L304 Erythema intertrigo: Secondary | ICD-10-CM | POA: Diagnosis not present

## 2015-08-31 DIAGNOSIS — B079 Viral wart, unspecified: Secondary | ICD-10-CM | POA: Diagnosis not present

## 2015-08-31 DIAGNOSIS — Z1283 Encounter for screening for malignant neoplasm of skin: Secondary | ICD-10-CM | POA: Diagnosis not present

## 2015-08-31 DIAGNOSIS — L820 Inflamed seborrheic keratosis: Secondary | ICD-10-CM | POA: Diagnosis not present

## 2015-09-04 DIAGNOSIS — F411 Generalized anxiety disorder: Secondary | ICD-10-CM | POA: Diagnosis not present

## 2015-09-04 DIAGNOSIS — I10 Essential (primary) hypertension: Secondary | ICD-10-CM | POA: Diagnosis not present

## 2015-09-04 DIAGNOSIS — E039 Hypothyroidism, unspecified: Secondary | ICD-10-CM | POA: Diagnosis not present

## 2015-09-13 ENCOUNTER — Telehealth: Payer: Self-pay | Admitting: Gynecology

## 2015-09-13 NOTE — Telephone Encounter (Signed)
Second encounter pt had previous encounter for prolia

## 2015-09-13 NOTE — Telephone Encounter (Signed)
PC to pt , waiting on insurance coverage. She would like to start in December if possible . Will call with information..  Calcium 9.2 on 08/21/15

## 2015-09-17 NOTE — Telephone Encounter (Signed)
Received insurance benefits . Deductible $166 (met). $0 cost to pt Secondary insurance coverage. Appointment with Nurse for injection 10/01/15 at 3 pm. Also will have blood work ordered by [place].

## 2015-09-18 ENCOUNTER — Encounter: Payer: Self-pay | Admitting: Gynecology

## 2015-09-18 DIAGNOSIS — Z905 Acquired absence of kidney: Secondary | ICD-10-CM | POA: Diagnosis not present

## 2015-09-18 DIAGNOSIS — C642 Malignant neoplasm of left kidney, except renal pelvis: Secondary | ICD-10-CM | POA: Diagnosis not present

## 2015-09-26 DIAGNOSIS — I10 Essential (primary) hypertension: Secondary | ICD-10-CM | POA: Diagnosis not present

## 2015-09-26 DIAGNOSIS — Z79891 Long term (current) use of opiate analgesic: Secondary | ICD-10-CM | POA: Diagnosis not present

## 2015-09-26 DIAGNOSIS — F411 Generalized anxiety disorder: Secondary | ICD-10-CM | POA: Diagnosis not present

## 2015-09-26 DIAGNOSIS — E039 Hypothyroidism, unspecified: Secondary | ICD-10-CM | POA: Diagnosis not present

## 2015-09-28 ENCOUNTER — Other Ambulatory Visit: Payer: Self-pay | Admitting: Internal Medicine

## 2015-10-01 ENCOUNTER — Other Ambulatory Visit: Payer: Medicare Other

## 2015-10-01 ENCOUNTER — Ambulatory Visit (INDEPENDENT_AMBULATORY_CARE_PROVIDER_SITE_OTHER): Payer: Medicare Other | Admitting: *Deleted

## 2015-10-01 DIAGNOSIS — E349 Endocrine disorder, unspecified: Secondary | ICD-10-CM | POA: Diagnosis not present

## 2015-10-01 DIAGNOSIS — M810 Age-related osteoporosis without current pathological fracture: Secondary | ICD-10-CM

## 2015-10-01 DIAGNOSIS — J4530 Mild persistent asthma, uncomplicated: Secondary | ICD-10-CM | POA: Diagnosis not present

## 2015-10-01 DIAGNOSIS — J309 Allergic rhinitis, unspecified: Secondary | ICD-10-CM | POA: Diagnosis not present

## 2015-10-01 DIAGNOSIS — J300 Vasomotor rhinitis: Secondary | ICD-10-CM | POA: Diagnosis not present

## 2015-10-01 MED ORDER — DENOSUMAB 60 MG/ML ~~LOC~~ SOLN
60.0000 mg | Freq: Once | SUBCUTANEOUS | Status: AC
  Administered 2015-10-01: 60 mg via SUBCUTANEOUS

## 2015-10-02 LAB — VITAMIN D 25 HYDROXY (VIT D DEFICIENCY, FRACTURES): Vit D, 25-Hydroxy: 39 ng/mL (ref 30–100)

## 2015-10-02 LAB — PARATHYROID HORMONE, INTACT (NO CA): PTH: 95 pg/mL — ABNORMAL HIGH (ref 14–64)

## 2015-10-02 NOTE — Telephone Encounter (Signed)
prolia injection 12/19 /16 next due after06/20/17

## 2015-10-03 ENCOUNTER — Other Ambulatory Visit: Payer: Self-pay | Admitting: Women's Health

## 2015-10-03 DIAGNOSIS — K219 Gastro-esophageal reflux disease without esophagitis: Secondary | ICD-10-CM | POA: Diagnosis not present

## 2015-10-03 DIAGNOSIS — I10 Essential (primary) hypertension: Secondary | ICD-10-CM | POA: Diagnosis not present

## 2015-10-03 DIAGNOSIS — E039 Hypothyroidism, unspecified: Secondary | ICD-10-CM | POA: Diagnosis not present

## 2015-10-03 DIAGNOSIS — Z13820 Encounter for screening for osteoporosis: Secondary | ICD-10-CM

## 2015-10-03 DIAGNOSIS — J4520 Mild intermittent asthma, uncomplicated: Secondary | ICD-10-CM | POA: Diagnosis not present

## 2015-10-14 HISTORY — PX: NOSE SURGERY: SHX723

## 2015-10-14 HISTORY — PX: EYE SURGERY: SHX253

## 2015-10-30 DIAGNOSIS — J330 Polyp of nasal cavity: Secondary | ICD-10-CM | POA: Diagnosis not present

## 2015-10-30 DIAGNOSIS — J310 Chronic rhinitis: Secondary | ICD-10-CM | POA: Diagnosis not present

## 2015-10-30 DIAGNOSIS — J342 Deviated nasal septum: Secondary | ICD-10-CM | POA: Diagnosis not present

## 2015-10-31 ENCOUNTER — Other Ambulatory Visit (INDEPENDENT_AMBULATORY_CARE_PROVIDER_SITE_OTHER): Payer: Self-pay | Admitting: Otolaryngology

## 2015-10-31 DIAGNOSIS — J329 Chronic sinusitis, unspecified: Secondary | ICD-10-CM

## 2015-11-08 ENCOUNTER — Encounter (HOSPITAL_COMMUNITY): Payer: Self-pay

## 2015-11-08 ENCOUNTER — Emergency Department (HOSPITAL_COMMUNITY)
Admission: EM | Admit: 2015-11-08 | Discharge: 2015-11-08 | Disposition: A | Discharge status: Home / Self Care | Payer: Medicare Other | Attending: Emergency Medicine | Admitting: Emergency Medicine

## 2015-11-08 ENCOUNTER — Ambulatory Visit
Admission: RE | Admit: 2015-11-08 | Discharge: 2015-11-08 | Disposition: A | Discharge status: Home / Self Care | Payer: Medicare Other | Source: Ambulatory Visit | Attending: Internal Medicine | Admitting: Internal Medicine

## 2015-11-08 ENCOUNTER — Emergency Department (HOSPITAL_COMMUNITY): Payer: Medicare Other

## 2015-11-08 ENCOUNTER — Other Ambulatory Visit: Payer: Self-pay | Admitting: Internal Medicine

## 2015-11-08 ENCOUNTER — Inpatient Hospital Stay: Admission: RE | Admit: 2015-11-08 | Payer: No Typology Code available for payment source | Source: Ambulatory Visit

## 2015-11-08 DIAGNOSIS — R071 Chest pain on breathing: Secondary | ICD-10-CM | POA: Diagnosis not present

## 2015-11-08 DIAGNOSIS — Z7951 Long term (current) use of inhaled steroids: Secondary | ICD-10-CM | POA: Diagnosis not present

## 2015-11-08 DIAGNOSIS — K219 Gastro-esophageal reflux disease without esophagitis: Secondary | ICD-10-CM | POA: Insufficient documentation

## 2015-11-08 DIAGNOSIS — R0602 Shortness of breath: Secondary | ICD-10-CM

## 2015-11-08 DIAGNOSIS — R042 Hemoptysis: Secondary | ICD-10-CM

## 2015-11-08 DIAGNOSIS — Z87891 Personal history of nicotine dependence: Secondary | ICD-10-CM | POA: Diagnosis not present

## 2015-11-08 DIAGNOSIS — H409 Unspecified glaucoma: Secondary | ICD-10-CM | POA: Insufficient documentation

## 2015-11-08 DIAGNOSIS — Z8742 Personal history of other diseases of the female genital tract: Secondary | ICD-10-CM | POA: Insufficient documentation

## 2015-11-08 DIAGNOSIS — Z79899 Other long term (current) drug therapy: Secondary | ICD-10-CM | POA: Diagnosis not present

## 2015-11-08 DIAGNOSIS — Z880 Allergy status to penicillin: Secondary | ICD-10-CM | POA: Insufficient documentation

## 2015-11-08 DIAGNOSIS — Z8639 Personal history of other endocrine, nutritional and metabolic disease: Secondary | ICD-10-CM | POA: Diagnosis not present

## 2015-11-08 DIAGNOSIS — Z8739 Personal history of other diseases of the musculoskeletal system and connective tissue: Secondary | ICD-10-CM | POA: Insufficient documentation

## 2015-11-08 DIAGNOSIS — I10 Essential (primary) hypertension: Secondary | ICD-10-CM | POA: Diagnosis not present

## 2015-11-08 DIAGNOSIS — R079 Chest pain, unspecified: Secondary | ICD-10-CM

## 2015-11-08 DIAGNOSIS — Z85528 Personal history of other malignant neoplasm of kidney: Secondary | ICD-10-CM | POA: Diagnosis not present

## 2015-11-08 DIAGNOSIS — J449 Chronic obstructive pulmonary disease, unspecified: Secondary | ICD-10-CM | POA: Insufficient documentation

## 2015-11-08 DIAGNOSIS — F329 Major depressive disorder, single episode, unspecified: Secondary | ICD-10-CM | POA: Diagnosis not present

## 2015-11-08 DIAGNOSIS — F419 Anxiety disorder, unspecified: Secondary | ICD-10-CM | POA: Diagnosis not present

## 2015-11-08 LAB — D-DIMER, QUANTITATIVE (NOT AT ARMC): D DIMER QUANT: 0.28 ug{FEU}/mL (ref 0.00–0.50)

## 2015-11-08 LAB — I-STAT TROPONIN, ED: Troponin i, poc: 0.01 ng/mL (ref 0.00–0.08)

## 2015-11-08 LAB — CBC
HEMATOCRIT: 42.8 % (ref 36.0–46.0)
HEMOGLOBIN: 14.5 g/dL (ref 12.0–15.0)
MCH: 30.7 pg (ref 26.0–34.0)
MCHC: 33.9 g/dL (ref 30.0–36.0)
MCV: 90.7 fL (ref 78.0–100.0)
Platelets: 199 10*3/uL (ref 150–400)
RBC: 4.72 MIL/uL (ref 3.87–5.11)
RDW: 13.4 % (ref 11.5–15.5)
WBC: 8.3 10*3/uL (ref 4.0–10.5)

## 2015-11-08 LAB — BASIC METABOLIC PANEL
ANION GAP: 8 (ref 5–15)
BUN: 16 mg/dL (ref 6–20)
CO2: 24 mmol/L (ref 22–32)
Calcium: 9.5 mg/dL (ref 8.9–10.3)
Chloride: 109 mmol/L (ref 101–111)
Creatinine, Ser: 0.99 mg/dL (ref 0.44–1.00)
GFR calc Af Amer: >60 mL/min (ref >60)
GFR calc non Af Amer: 57 mL/min — ABNORMAL LOW (ref >60)
Glucose, Bld: 98 mg/dL (ref 65–99)
POTASSIUM: 3.8 mmol/L (ref 3.5–5.1)
Sodium: 141 mmol/L (ref 135–145)

## 2015-11-08 MED ORDER — ALBUTEROL SULFATE (2.5 MG/3ML) 0.083% IN NEBU
5.0000 mg | INHALATION_SOLUTION | Freq: Once | RESPIRATORY_TRACT | Status: AC
  Administered 2015-11-08: 5 mg via RESPIRATORY_TRACT
  Filled 2015-11-08: qty 6

## 2015-11-08 NOTE — ED Notes (Signed)
EKG given to Dr. Kohut 

## 2015-11-08 NOTE — ED Provider Notes (Signed)
CSN: [id number]     Arrival date & time 11/08/15  1551 History   First MD Initiated Contact with Patient 11/08/15 1756     Chief Complaint  Patient presents with  . Hemoptysis  . Chest Pain      HPI Patient presents to emergency department after single episode of isolated hemoptysis this morning.  She's never had hemoptysis before.  No history DVT or pulmonary embolism.  She reports she always coughs phlegm in the morning with this time some blood came up as well.  She has had no more hemoptysis today.  She denies fevers or chills.  She does report mild shortness of breath.  She does have a history of asthma and has not tried her albuterol inhaler today.  She reports feeling a sensation of heaviness in her chest over the past 2 days.  No history of heart failure.  Denies orthopnea.  No history of coronary artery disease.  Her discomfort is been constant.   Past Medical History  Diagnosis Date  . Lymphocytic colitis   . Cancer ([place])     Kidney cancer-Hypernephroma  . Hypertension   . Elevated cholesterol   . Cervical dysplasia   . Anxiety   . Depression   . COPD (chronic obstructive pulmonary disease) ([place])   . Osteoporosis 08/2015    T score -3.2  . Allergy   . Asthma   . GERD (gastroesophageal reflux disease)   . Glaucoma   . Serum calcium elevated    Past Surgical History  Procedure Laterality Date  . Abdominal hysterectomy  1989    TAH LSO  . Tonsillectomy    . Kidney removed  2002    Left  . Colposcopy    . Oophorectomy      LSO  . Augmentation mammaplasty      Implants removed  . Cosmetic surgery     Family History  Problem Relation Age of Onset  . Hypertension Mother   . Heart disease Mother   . Asthma Mother   . Lung cancer Father   . Cancer Father     lung  . Hypertension Sister   . Cancer Sister     Stem cell cancer  . Hypertension Brother   . Stroke Brother   . Ovarian cancer Maternal Aunt   . Breast cancer Maternal Aunt     Age 27's  . Cancer  Maternal Uncle     Prostate cancer  . Breast cancer Maternal Aunt     Age 13's   Social History  Substance Use Topics  . Smoking status: Former Smoker    Quit date: 08/23/1983  . Smokeless tobacco: None  . Alcohol Use: 10.5 oz/week    21 Standard drinks or equivalent per week     Comment: social   OB History    Gravida Para Term Preterm AB TAB SAB Ectopic Multiple Living   1 1 1       1      Review of Systems  All other systems reviewed and are negative.     Allergies  Iodine; Iohexol; Penicillins; and Sulfa antibiotics  Home Medications   Prior to Admission medications   Medication Sig Start Date End Date Taking? Authorizing Provider  ALPRAZolam Duanne Moron) 0.5 MG tablet Take 0.5-1 mg by mouth at bedtime as needed. Sleep. 10/16/15  Yes Historical Provider, MD  amLODipine (NORVASC) 5 MG tablet TAKE 1/2 TO 1 TABLET DAILY FOR BLOOD PRESSURE 02/21/14  Yes Vicie Mutters, PA-C  budesonide-formoterol (SYMBICORT) 160-4.5 MCG/ACT inhaler Inhale 2 puffs into the lungs 2 (two) times daily.   Yes Historical Provider, MD  dorzolamide-timolol (COSOPT) 22.3-6.8 MG/ML ophthalmic solution Place 1 drop into the left eye 2 (two) times daily. 10/09/15  Yes Historical Provider, MD  escitalopram (LEXAPRO) 20 MG tablet Take 20 mg by mouth daily. 07/23/15  Yes Historical Provider, MD  hydrALAZINE (APRESOLINE) 25 MG tablet Take 25 mg by mouth 2 (two) times daily.    Yes Historical Provider, MD  levocetirizine (XYZAL) 5 MG tablet Take 5 mg by mouth daily.   Yes Historical Provider, MD  levothyroxine (SYNTHROID, LEVOTHROID) 100 MCG tablet Take 100 mcg by mouth daily before breakfast.   Yes Historical Provider, MD  pantoprazole (PROTONIX) 40 MG tablet Take 40 mg by mouth daily.   Yes Historical Provider, MD  Probiotic Product (PROBIOTIC PO) Take 1 tablet by mouth at bedtime.    Yes Historical Provider, MD  VENTOLIN HFA 108 (90 Base) MCG/ACT inhaler Inhale 1-2 puffs into the lungs every 4 (four) hours as  needed. Ever  4-6 hours prn cough or wheeze. 10/18/15  Yes Historical Provider, MD  ALPRAZolam Duanne Moron) 0.25 MG tablet Take 1 tablet (0.25 mg total) by mouth at bedtime as needed for anxiety. Patient not taking: Reported on 11/08/2015 04/17/15   Lacretia Leigh, MD  ciprofloxacin (CIPRO) 250 MG tablet Take 1 tablet (250 mg total) by mouth 2 (two) times daily. Patient not taking: Reported on 11/08/2015 08/27/15   Anastasio Auerbach, MD   BP 134/80 mmHg  Pulse 65  Temp(Src) 98.5 F (36.9 C) (Oral)  Resp 15  SpO2 96% Physical Exam  Constitutional: She is oriented to person, place, and time. She appears well-developed and well-nourished. No distress.  HENT:  Head: Normocephalic and atraumatic.  Eyes: EOM are normal.  Neck: Normal range of motion.  Cardiovascular: Normal rate, regular rhythm and normal heart sounds.   Pulmonary/Chest: Effort normal and breath sounds normal.  Abdominal: Soft. She exhibits no distension. There is no tenderness.  Musculoskeletal: Normal range of motion. She exhibits no edema or tenderness.  Neurological: She is alert and oriented to person, place, and time.  Skin: Skin is warm and dry.  Psychiatric: She has a normal mood and affect. Judgment normal.  Nursing note and vitals reviewed.   ED Course  Procedures (including critical care time) Labs Review Labs Reviewed  BASIC METABOLIC PANEL - Abnormal; Notable for the following:    GFR calc non Af Amer 57 (*)    All other components within normal limits  CBC  D-DIMER, QUANTITATIVE (NOT AT [facility])  Randolm Idol, ED    Imaging Review Dg Chest 2 View  11/08/2015  CLINICAL DATA:  Hemoptysis, shortness breath, chest tightness. History of asthma, COPD, hypertension. Ex-smoker. EXAM: CHEST  2 VIEW COMPARISON:  Chest x-ray dated 10/31/2014. FINDINGS: Heart size is normal. Overall cardiomediastinal silhouette is stable in size and configuration. Lungs are clear. Lungs are hyperexpanded indicating COPD. No pleural  effusions seen. No pneumothorax seen P There is a stable scoliosis of the thoracic spine. Surgical clips are present within the upper abdomen. Osseous and soft tissue structures about the chest are otherwise unremarkable. IMPRESSION: Lungs are clear and there is no evidence of acute cardiopulmonary abnormality. Electronically Signed   By: Franki Cabot M.D.   On: 11/08/2015 17:21   I have personally reviewed and evaluated these images and lab results as part of my medical decision-making.   EKG Interpretation   Date/Time:  Thursday November 08 2015 16:36:14 EST Ventricular Rate:  73 PR Interval:  195 QRS Duration: 95 QT Interval:  383 QTC Calculation: 422 R Axis:   48 Text Interpretation:  Sinus rhythm Probable left atrial enlargement  Borderline low voltage, extremity leads Nonspecific T abnormalities,  lateral leads No significant change was found Confirmed by Elizabelle Fite  MD,  Lennette Bihari ([id number]) on 11/08/2015 8:01:29 PM      MDM   Final diagnoses:  Hemoptysis    A symptomatically at this time.  D-dimer is normal.  Discharge home in good condition.  She understands that if she were to have recurrent hemoptysis she will likely need bronchoscopy by pulmonary.  She is overall well-appearing.  Vital signs are normal.  No hypoxia.  Suspect bronchitis.  Feels better after albuterol.  Likely bronchitis bronchospasm.    Jola Schmidt, MD 11/08/15 2020

## 2015-11-08 NOTE — Discharge Instructions (Signed)
Hemoptysis  Hemoptysis, which means coughing up blood, can be a sign of a minor problem or a serious medical condition. The blood that is coughed up may come from the lungs and airways. Coughed-up blood can also come from bleeding that occurs outside the lungs and airways. Blood can drain into the windpipe during a severe nosebleed or when blood is vomited from the stomach. Because hemoptysis can be a sign of something serious, a medical evaluation is required. For some people with hemoptysis, no definite cause is ever identified.  CAUSES   The most common cause of hemoptysis is bronchitis. Some other common causes include:   · A ruptured blood vessel caused by coughing or an infection.    · A medical condition that causes damage to the large air passageways (bronchiectasis).    · A blood clot in the lungs (pulmonary embolism).    · Pneumonia.    · Tuberculosis.    · Breathing in a small foreign object.    · Cancer.  For some people with hemoptysis, no definite cause is ever identified.    HOME CARE INSTRUCTIONS  · Only take over-the-counter or prescription medicines as directed by your caregiver. Do not use cough suppressants unless your caregiver approves.  · If your caregiver prescribes antibiotic medicines, take them as directed. Finish them even if you start to feel better.  · Do not smoke. Also avoid secondhand smoke.  · Follow up with your caregiver as directed.  SEEK IMMEDIATE MEDICAL CARE IF:   · You cough up bloody mucus for longer than a week.  · You have a blood-producing cough that is severe or getting worse.  · You have a blood-producing cough that comes and goes over time.  · You develop problems with your breathing.    · You vomit blood.  · You develop bloody or black-colored stools.  · You have chest pain.    · You develop night sweats.  · You feel faint or pass out.    · You have a fever or persistent symptoms for more than 2-3 days.    · You have a fever and your symptoms suddenly get worse.  MAKE  SURE YOU:  · Understand these instructions.  · Will watch your condition.  · Will get help right away if you are not doing well or get worse.     This information is not intended to replace advice given to you by your health care provider. Make sure you discuss any questions you have with your health care provider.     Document Released: 12/08/2001 Document Revised: 09/15/2012 Document Reviewed: 07/16/2012  Elsevier Interactive Patient Education ©2016 Elsevier Inc.

## 2015-11-08 NOTE — ED Notes (Addendum)
Patient c/o [facility] and hemoptysis this morning.  Patient has chronic SHOB but states it has been worse since this AM.  Patient states she has sinus probe done on 1/17.  Patient also c/o chest pain and middle back pain.  Patient states pain is worse with deep breathing. Pt was seen by PCP today and was sent to ER for further assessment.

## 2015-11-11 DIAGNOSIS — J22 Unspecified acute lower respiratory infection: Secondary | ICD-10-CM | POA: Diagnosis not present

## 2015-11-11 DIAGNOSIS — J45909 Unspecified asthma, uncomplicated: Secondary | ICD-10-CM | POA: Diagnosis not present

## 2015-11-12 ENCOUNTER — Ambulatory Visit
Admission: RE | Admit: 2015-11-12 | Discharge: 2015-11-12 | Disposition: A | Discharge status: Home / Self Care | Payer: Medicare Other | Source: Ambulatory Visit | Attending: Otolaryngology | Admitting: Otolaryngology

## 2015-11-12 DIAGNOSIS — J329 Chronic sinusitis, unspecified: Secondary | ICD-10-CM

## 2015-11-14 DIAGNOSIS — J320 Chronic maxillary sinusitis: Secondary | ICD-10-CM | POA: Diagnosis not present

## 2015-11-14 DIAGNOSIS — J330 Polyp of nasal cavity: Secondary | ICD-10-CM | POA: Diagnosis not present

## 2015-11-14 DIAGNOSIS — J342 Deviated nasal septum: Secondary | ICD-10-CM | POA: Diagnosis not present

## 2015-11-14 DIAGNOSIS — J322 Chronic ethmoidal sinusitis: Secondary | ICD-10-CM | POA: Diagnosis not present

## 2015-11-16 ENCOUNTER — Institutional Professional Consult (permissible substitution): Payer: Medicare Other | Admitting: Internal Medicine

## 2015-11-16 DIAGNOSIS — J209 Acute bronchitis, unspecified: Secondary | ICD-10-CM | POA: Diagnosis not present

## 2015-11-27 DIAGNOSIS — K449 Diaphragmatic hernia without obstruction or gangrene: Secondary | ICD-10-CM | POA: Diagnosis not present

## 2015-11-27 DIAGNOSIS — R079 Chest pain, unspecified: Secondary | ICD-10-CM | POA: Diagnosis not present

## 2015-11-27 DIAGNOSIS — K219 Gastro-esophageal reflux disease without esophagitis: Secondary | ICD-10-CM | POA: Diagnosis not present

## 2015-11-27 DIAGNOSIS — Z86010 Personal history of colonic polyps: Secondary | ICD-10-CM | POA: Diagnosis not present

## 2015-11-30 DIAGNOSIS — R0602 Shortness of breath: Secondary | ICD-10-CM | POA: Diagnosis not present

## 2015-11-30 DIAGNOSIS — R05 Cough: Secondary | ICD-10-CM | POA: Diagnosis not present

## 2015-12-07 DIAGNOSIS — B079 Viral wart, unspecified: Secondary | ICD-10-CM | POA: Diagnosis not present

## 2015-12-07 DIAGNOSIS — D225 Melanocytic nevi of trunk: Secondary | ICD-10-CM | POA: Diagnosis not present

## 2015-12-07 DIAGNOSIS — Z1283 Encounter for screening for malignant neoplasm of skin: Secondary | ICD-10-CM | POA: Diagnosis not present

## 2015-12-07 DIAGNOSIS — Z85820 Personal history of malignant melanoma of skin: Secondary | ICD-10-CM | POA: Diagnosis not present

## 2015-12-07 DIAGNOSIS — Z08 Encounter for follow-up examination after completed treatment for malignant neoplasm: Secondary | ICD-10-CM | POA: Diagnosis not present

## 2015-12-10 DIAGNOSIS — J209 Acute bronchitis, unspecified: Secondary | ICD-10-CM | POA: Diagnosis not present

## 2015-12-24 ENCOUNTER — Ambulatory Visit (INDEPENDENT_AMBULATORY_CARE_PROVIDER_SITE_OTHER): Payer: Medicare Other | Admitting: Pulmonary Disease

## 2015-12-24 ENCOUNTER — Encounter: Payer: Self-pay | Admitting: Pulmonary Disease

## 2015-12-24 VITALS — BP 118/78 | HR 68 | Temp 97.6°F | Ht 65.0 in | Wt 159.8 lb

## 2015-12-24 DIAGNOSIS — J339 Nasal polyp, unspecified: Secondary | ICD-10-CM | POA: Diagnosis not present

## 2015-12-24 DIAGNOSIS — J342 Deviated nasal septum: Secondary | ICD-10-CM

## 2015-12-24 DIAGNOSIS — J45909 Unspecified asthma, uncomplicated: Secondary | ICD-10-CM | POA: Diagnosis not present

## 2015-12-24 DIAGNOSIS — J449 Chronic obstructive pulmonary disease, unspecified: Secondary | ICD-10-CM | POA: Diagnosis not present

## 2015-12-24 MED ORDER — BUDESONIDE 0.25 MG/2ML IN SUSP: 0.2500 mg | Freq: Two times a day (BID) | RESPIRATORY_TRACT | Status: DC

## 2015-12-24 MED ORDER — IPRATROPIUM-ALBUTEROL 0.5-2.5 (3) MG/3ML IN SOLN: 3.0000 mL | Freq: Two times a day (BID) | RESPIRATORY_TRACT | Status: DC

## 2015-12-24 NOTE — Progress Notes (Signed)
Subjective:     Patient ID: [name] [name], female   DOB: 04/14/1946, 70 y.o.   MRN: [id number]  HPI  ~  December 24, 2015:  Initial pulmonary consult by SN>   Her PCP is Dr. Sandi Mariscal at [facility])-- followed for AR/ Asthma/ COPD, HBP, HL, Hypothyroid, GERD, Hx kidney cancer- s/p left nephrectomy, Anxiety/Depression... 70 y/o WF referred by DrTeoh, ENT for pre-op pulmonary clearance for planned ENT surg (see below)>     [name] [name] has a min smoking hx & quit in 1984; she was prev treated by DrMcKeown on Symbicort160-2spBid, then moved to [place], then back to [place] and her new PCP DrSun gave her [facility] [employer] & she really likes this med but they are all >$300 on her insurance; she notes min cough, small amt clear sput, DOE w/ exertion but does ok w/ usual activities and ADLs; she had a bronchitic exac 10/2015 w/ some discolored sput & streaky hemoptysis & was seen in the ER-- Symbicort & Ventolin were listed as her meds, Chest was clear on exam, CXR was clear/NAD and LABS were also wnl including D-dimer; Given NEB rx and antibiotic & improved; she noted some SOB but mostly because she couldn't breathe thru her nose=> referred to DrTeoh, ENT...    She saw DrCDYoung yrs ago- last 2009 & avail records indicate hx Asthma w/ COPD & mult exac treated w/ Pred etc; she had DOE & faint wheezing on exam; Hx GERD treated by DrPerry    She saw DrTeoh w/ facial pressure & nasal obstruction- eval w/ CT Sinus & flex endoscopy revealed bilat nasal polyps, deviated septum, bilat inferior turbinate hypertrophy, & a large right concha bullosa;  He plans surg for all these findings and requests pre-op pulmonary eval...     Note: there is a mention of "mild to mod OSA" diagnosed on a sleep study in 2015 & a letter from [facility] for an oral sleep appliance; none of the original data is avail to [place] & the pt never received any treatment for OSA...  Smoking Hx>  Ex-smoker, smoked for ~10 yrs, up to 1/2 PPD, quit smoking in  1984 for ~5pack-yr smoking hx...  Pulmonary Hx>  She sees DrSharma for Allergy (we do not have his records); told to have asthma and COPD in the past; said to have OSA as well (we do not have any of this data).  Medical Hx>  HBP, HL, Hypothyroid, GERD, Hx kidney cancer- s/p left nephrectomy, Anxiety/Depression  Family Hx>  Father had lung cancer, Mother had asthma; her [place] were [place].  Occup Hx>  No known exposure to asbestos or any toxins.  Current Meds>  Xyzal5, VentolinHFA, Breo, [place], Apres25, Protonix40, Synthroid100, Prolia, Alpraz0.5, Lexapro20  EXAM shows Afeb, VSS, O2sat=96% onRA;  HEENT- nasal obstruction/ congestion, mallampati3;  Chest- few bibasilar rhonchi w/o w/r/consolidation;  Heart- RR w/o m/r/g;  Abd- soft, nontender, neg;  Ext- w/o c/c/e;  Neuro- intact...  Last CXR in Epic was 11/08/15>  Norm heart size, clear sl hyperexpanded lungs, mild scoliosis, NAD...  Spirometry 12/24/15>  FVC=2.40 (79%), FEV1=1.25 (54%), %1sec=52, mid-flows reduced at 32% predicted... c/w moderate airflow obstruction and GOLD Stage 2 COPD...  Ambulatory oximetry 12/24/15>  O2sat=100% on RA at rest w/ pulse=69/min;  She ambulated 3 Laps in the office w/ lowest O2say=98% w/ pulse=97/min... No desaturations...   IMP >>     COPD-- mod persistent, chronic bronchitic type, with a reversible component  Hx allergic rhinitis-- treated by DrSharma, we do not have his notes    Hx OSA-- sleep study and rx per DrMcKeown in 2015, we do not have any of this data    ENT-- per DrTeoh w/ facial pressure & nasal obstruction- eval w/ CT Sinus & flex endoscopy revealed bilat nasal polyps, deviated septum, bilat inferior turbinate hypertrophy, & a large right concha bullosa    Medical issues-- per DrSun w/ HBP, HL, Hypothyroid, GERD, Hx kidney cancer- s/p left nephrectomy, osteopenia, Anxiety/Depression  PLAN >>     [name] [name] has done well on the Breo one inhalation daily but the $300/mo  is prohibitive; we discussed trial of NEBULIZED medications to see if they work as well and are less expensive; In this regard- start DUONEB BID via Nebulizer and BUDESONIDE 0.25 BID via Nebulizer; she will continue to use the VentolinHFA as needed and her other meds as listed... we plan an ROV recheck in 33mo, sooner if needed for acute problems, she is OK for surg...    Past Medical History  Diagnosis Date  . Lymphocytic colitis   . Cancer ([place])     Kidney cancer-Hypernephroma  . Hypertension   . Elevated cholesterol   . Cervical dysplasia   . Anxiety   . Depression   . COPD (chronic obstructive pulmonary disease) ([place])   . Osteoporosis 08/2015    T score -3.2  . Allergy   . Asthma   . GERD (gastroesophageal reflux disease)   . Glaucoma   . Serum calcium elevated     Past Surgical History  Procedure Laterality Date  . Abdominal hysterectomy  1989    TAH LSO  . Tonsillectomy    . Kidney removed  2002    Left  . Colposcopy    . Oophorectomy      LSO  . Augmentation mammaplasty      Implants removed  . Cosmetic surgery      Outpatient Encounter Prescriptions as of 12/24/2015  Medication Sig  . ALPRAZolam (XANAX) 0.5 MG tablet Take 0.5-1 mg by mouth at bedtime as needed. Sleep.  Marland Kitchen amLODipine (NORVASC) 5 MG tablet TAKE 1/2 TO 1 TABLET DAILY FOR BLOOD PRESSURE  . co-enzyme Q-10 50 MG capsule Take 50 mg by mouth daily.  . dorzolamide-timolol (COSOPT) 22.3-6.8 MG/ML ophthalmic solution Place 1 drop into the left eye 2 (two) times daily.  Marland Kitchen escitalopram (LEXAPRO) 20 MG tablet Take 20 mg by mouth daily.  . fluticasone furoate-vilanterol (BREO ELLIPTA) 200-25 MCG/INH AEPB Inhale 1 puff into the lungs daily.  . hydrALAZINE (APRESOLINE) 25 MG tablet Take 25 mg by mouth 2 (two) times daily.   Marland Kitchen levocetirizine (XYZAL) 5 MG tablet Take 5 mg by mouth daily.  Marland Kitchen levothyroxine (SYNTHROID, LEVOTHROID) 100 MCG tablet Take 100 mcg by mouth daily before breakfast.  . pantoprazole (PROTONIX)  40 MG tablet Take 40 mg by mouth daily.  . Probiotic Product (PROBIOTIC PO) Take 1 tablet by mouth at bedtime.   . VENTOLIN HFA 108 (90 Base) MCG/ACT inhaler Inhale 1-2 puffs into the lungs every 4 (four) hours as needed. Ever  4-6 hours prn cough or wheeze.  . [DISCONTINUED] ALPRAZolam (XANAX) 0.25 MG tablet Take 1 tablet (0.25 mg total) by mouth at bedtime as needed for anxiety. (Patient not taking: Reported on 11/08/2015)  . [DISCONTINUED] budesonide-formoterol (SYMBICORT) 160-4.5 MCG/ACT inhaler Inhale 2 puffs into the lungs 2 (two) times daily.  . [DISCONTINUED] ciprofloxacin (CIPRO) 250 MG tablet Take 1 tablet (250 mg  total) by mouth 2 (two) times daily. (Patient not taking: Reported on 11/08/2015)    Allergies  Allergen Reactions  . Iodine     Pt has only one kidney.  . Iohexol      Code: RASH, Desc: PT STATES SHE HAS ONE KIDNEY SO NOT TO USE IV DYE/10/27/06/RM, Onset Date: [id number]   . Penicillins     Has patient had a PCN reaction causing immediate rash, facial/tongue/throat swelling, SOB or lightheadedness with hypotension: (No) Has patient had a PCN reaction causing severe rash involving mucus membranes or skin necrosis: (No) Has patient had a PCN reaction that required hospitalization (No) Has patient had a PCN reaction occurring within the last 10 years: (No) Pt states she has historically tolerated PCN, but tested positive for allergy.   If all of the above answers are "NO", then may proce  . Sulfa Antibiotics Nausea Only    Immunization History  Administered Date(s) Administered  . Influenza,inj,Quad PF,36+ Mos 09/27/2013  . Influenza-Unspecified 07/13/2012  . Pneumococcal Polysaccharide-23 09/12/2006    Family History  Problem Relation Age of Onset  . Hypertension Mother   . Heart disease Mother   . Asthma Mother   . Lung cancer Father   . Cancer Father     lung  . Hypertension Sister   . Cancer Sister     Stem cell cancer  . Hypertension Brother   . Stroke  Brother   . Ovarian cancer Maternal Aunt   . Breast cancer Maternal Aunt     Age 32's  . Cancer Maternal Uncle     Prostate cancer  . Breast cancer Maternal Aunt     Age 15's    Social History   Social History  . Marital Status: Married    Spouse Name: N/A  . Number of Children: N/A  . Years of Education: N/A   Occupational History  . Not on file.   Social History Main Topics  . Smoking status: Former Smoker -- 0.5 packs/day for 10 years    Types: Cigarettes    Quit date: 08/23/1983  . Smokeless tobacco: Never Used  . Alcohol Use: 12.6 oz/week    21 Standard drinks or equivalent per week     Comment: social  . Drug Use: No  . Sexual Activity: Yes    Birth Control/ Protection: Surgical     Comment: HYST   Other Topics Concern  . Not on file   Social History Narrative    Current Medications, Allergies, Past Medical History, Past Surgical History, Family History, and Social History were reviewed in [employer] record.   Review of Systems             All symptoms NEG except where BOLDED >>  Constitutional:  F/C/S, fatigue, anorexia, unexpected weight change. HEENT:  HA, visual changes, hearing loss, earache, nasal symptoms, sore throat, mouth sores, hoarseness. Resp:  cough, sputum, hemoptysis; SOB, tightness, wheezing. Cardio:  CP, palpit, DOE, orthopnea, edema. GI:  N/V/D/C, blood in stool; reflux, abd pain, distention, gas. GU:  dysuria, freq, urgency, hematuria, flank pain, voiding difficulty. MS:  joint pain, swelling, tenderness, decr ROM; neck pain, back pain, etc. Neuro:  HA, tremors, seizures, dizziness, syncope, weakness, numbness, gait abn. Skin:  suspicious lesions or skin rash. Heme:  adenopathy, bruising, bleeding. Psyche:  confusion, agitation, sleep disturbance, hallucinations, anxiety, depression suicidal.   Objective:   Physical Exam       Vital Signs:  Reviewed...  General:  WD,  WN, 70 y/o WF in NAD; alert &  oriented; pleasant & cooperative... HEENT:  [place]/AT; Conjunctiva- pink, Sclera- nonicteric, EOM-wnl, PERRLA, EACs-clear, TMs-wnl; NOSE-congestede; THROAT-clear & wnl. Neck:  Supple w/ fair ROM; no JVD; normal carotid impulses w/o bruits; no thyromegaly or nodules palpated; no lymphadenopathy. Chest:  decr BS at bases w/ scat rhonchi; no wheezing, rales, or signs of consolidation... Heart:  Regular Rhythm; norm S1 & S2 without murmurs, rubs, or gallops detected. Abdomen:  Soft & nontender- no guarding or rebound; normal bowel sounds; no organomegaly or masses palpated. Ext:  Normal ROM; without deformities or arthritic changes; no varicose veins, venous insuffic, or edema;  Pulses intact w/o bruits. Neuro:  No focal neuro deficits, sensory testing normal; gait normal & balance OK. Derm:  No lesions noted; no rash etc. Lymph:  No cervical, supraclavicular, axillary, or inguinal adenopathy palpated.   Assessment:      IMP >>     COPD-- mod persistent, chronic bronchitic type, with a reversible component     Hx allergic rhinitis-- treated by DrSharma, we do not have his notes    Hx OSA-- sleep study and rx per DrMcKeown in 2015, we do not have any of this data    ENT-- per DrTeoh w/ facial pressure & nasal obstruction- eval w/ CT Sinus & flex endoscopy revealed bilat nasal polyps, deviated septum, bilat inferior turbinate hypertrophy, & a large right concha bullosa    Medical issues-- per DrSun w/ HBP, HL, Hypothyroid, GERD, Hx kidney cancer- s/p left nephrectomy, Anxiety/Depression  PLAN >>     [name] [name] has done well on the Breo one inhalation daily but the $300/mo is prohibitive; we discussed trial of NEBULIZED medications to see if they work as well and are less expensive; In this regard- start DUONEB BID via Nebulizer and BUDESONIDE 0.25 BID via Nebulizer; she will continue to use the VentolinHFA as needed and her other meds as listed... we plan an ROV recheck in 86mo, sooner if needed for acute  problems, she is OK for surg...     Plan:     Patient's Medications  New Prescriptions   BUDESONIDE (PULMICORT) 0.25 MG/2ML NEBULIZER SOLUTION    Take 2 mLs (0.25 mg total) by nebulization 2 (two) times daily.   IPRATROPIUM-ALBUTEROL (DUONEB) 0.5-2.5 (3) MG/3ML SOLN    Take 3 mLs by nebulization 2 (two) times daily.  Previous Medications   ALPRAZOLAM (XANAX) 0.5 MG TABLET    Take 0.5-1 mg by mouth at bedtime as needed. Sleep.   AMLODIPINE (NORVASC) 5 MG TABLET    TAKE 1/2 TO 1 TABLET DAILY FOR BLOOD PRESSURE   CO-ENZYME Q-10 50 MG CAPSULE    Take 50 mg by mouth daily.   DORZOLAMIDE-TIMOLOL (COSOPT) 22.3-6.8 MG/ML OPHTHALMIC SOLUTION    Place 1 drop into the left eye 2 (two) times daily.   ESCITALOPRAM (LEXAPRO) 20 MG TABLET    Take 20 mg by mouth daily.   FLUTICASONE FUROATE-VILANTEROL (BREO ELLIPTA) 200-25 MCG/INH AEPB    Inhale 1 puff into the lungs daily.   HYDRALAZINE (APRESOLINE) 25 MG TABLET    Take 25 mg by mouth 2 (two) times daily.    LEVOCETIRIZINE (XYZAL) 5 MG TABLET    Take 5 mg by mouth daily.   LEVOTHYROXINE (SYNTHROID, LEVOTHROID) 100 MCG TABLET    Take 100 mcg by mouth daily before breakfast.   PANTOPRAZOLE (PROTONIX) 40 MG TABLET    Take 40 mg by mouth daily.   PROBIOTIC PRODUCT (PROBIOTIC PO)  Take 1 tablet by mouth at bedtime.    VENTOLIN HFA 108 (90 BASE) MCG/ACT INHALER    Inhale 1-2 puffs into the lungs every 4 (four) hours as needed. Ever  4-6 hours prn cough or wheeze.  Modified Medications   No medications on file  Discontinued Medications   ALPRAZOLAM (XANAX) 0.25 MG TABLET    Take 1 tablet (0.25 mg total) by mouth at bedtime as needed for anxiety.   BUDESONIDE-FORMOTEROL (SYMBICORT) 160-4.5 MCG/ACT INHALER    Inhale 2 puffs into the lungs 2 (two) times daily.   CIPROFLOXACIN (CIPRO) 250 MG TABLET    Take 1 tablet (250 mg total) by mouth 2 (two) times daily.

## 2015-12-24 NOTE — Patient Instructions (Signed)
Foy-- it was great meeting you today...  Today we reviewed the available records (CXR from [id number] etc) and performed a Spirometry test and an ambulatory oximetry test...    Your spirometry showed a moderate obstructive patter, and your oximetry is normal...  We will send copies of our report to DrTeoh (w/ an OK for surgery) and to DrSun...  We decided to try you on NEBULIZED medications>    Use the new DUONEB (Albuterol+Ipratropium) twice daily...    Followed by the new BUDESONIDE twice daily...  Try researching cheaper alternatives thru a [place] etc...  Call for any questions or if we can be of service in any way...  Let's plan a follow up visit in 52mo, sooner if needed for problems.Marland KitchenMarland Kitchen

## 2015-12-25 DIAGNOSIS — H401131 Primary open-angle glaucoma, bilateral, mild stage: Secondary | ICD-10-CM | POA: Diagnosis not present

## 2016-01-07 DIAGNOSIS — Z1389 Encounter for screening for other disorder: Secondary | ICD-10-CM | POA: Diagnosis not present

## 2016-01-07 DIAGNOSIS — I10 Essential (primary) hypertension: Secondary | ICD-10-CM | POA: Diagnosis not present

## 2016-01-07 DIAGNOSIS — Z0000 Encounter for general adult medical examination without abnormal findings: Secondary | ICD-10-CM | POA: Diagnosis not present

## 2016-01-07 DIAGNOSIS — E559 Vitamin D deficiency, unspecified: Secondary | ICD-10-CM | POA: Diagnosis not present

## 2016-01-07 DIAGNOSIS — R0602 Shortness of breath: Secondary | ICD-10-CM | POA: Diagnosis not present

## 2016-01-07 DIAGNOSIS — R5383 Other fatigue: Secondary | ICD-10-CM | POA: Diagnosis not present

## 2016-01-07 DIAGNOSIS — K589 Irritable bowel syndrome without diarrhea: Secondary | ICD-10-CM | POA: Diagnosis not present

## 2016-01-09 DIAGNOSIS — H401131 Primary open-angle glaucoma, bilateral, mild stage: Secondary | ICD-10-CM | POA: Diagnosis not present

## 2016-01-09 DIAGNOSIS — H2513 Age-related nuclear cataract, bilateral: Secondary | ICD-10-CM | POA: Diagnosis not present

## 2016-01-16 DIAGNOSIS — J320 Chronic maxillary sinusitis: Secondary | ICD-10-CM | POA: Diagnosis not present

## 2016-01-16 DIAGNOSIS — J322 Chronic ethmoidal sinusitis: Secondary | ICD-10-CM | POA: Diagnosis not present

## 2016-01-16 DIAGNOSIS — J330 Polyp of nasal cavity: Secondary | ICD-10-CM | POA: Diagnosis not present

## 2016-01-17 ENCOUNTER — Emergency Department (HOSPITAL_BASED_OUTPATIENT_CLINIC_OR_DEPARTMENT_OTHER): Payer: Medicare Other

## 2016-01-17 ENCOUNTER — Encounter (HOSPITAL_BASED_OUTPATIENT_CLINIC_OR_DEPARTMENT_OTHER): Payer: Self-pay | Admitting: *Deleted

## 2016-01-17 ENCOUNTER — Emergency Department (HOSPITAL_BASED_OUTPATIENT_CLINIC_OR_DEPARTMENT_OTHER)
Admission: EM | Admit: 2016-01-17 | Discharge: 2016-01-17 | Disposition: A | Discharge status: Home / Self Care | Payer: Medicare Other | Attending: Emergency Medicine | Admitting: Emergency Medicine

## 2016-01-17 DIAGNOSIS — Z85528 Personal history of other malignant neoplasm of kidney: Secondary | ICD-10-CM | POA: Diagnosis not present

## 2016-01-17 DIAGNOSIS — S93402A Sprain of unspecified ligament of left ankle, initial encounter: Secondary | ICD-10-CM | POA: Diagnosis not present

## 2016-01-17 DIAGNOSIS — W109XXA Fall (on) (from) unspecified stairs and steps, initial encounter: Secondary | ICD-10-CM | POA: Diagnosis not present

## 2016-01-17 DIAGNOSIS — Y999 Unspecified external cause status: Secondary | ICD-10-CM | POA: Insufficient documentation

## 2016-01-17 DIAGNOSIS — M25571 Pain in right ankle and joints of right foot: Secondary | ICD-10-CM | POA: Diagnosis not present

## 2016-01-17 DIAGNOSIS — I10 Essential (primary) hypertension: Secondary | ICD-10-CM | POA: Insufficient documentation

## 2016-01-17 DIAGNOSIS — Z79899 Other long term (current) drug therapy: Secondary | ICD-10-CM | POA: Diagnosis not present

## 2016-01-17 DIAGNOSIS — M7989 Other specified soft tissue disorders: Secondary | ICD-10-CM | POA: Diagnosis not present

## 2016-01-17 DIAGNOSIS — F329 Major depressive disorder, single episode, unspecified: Secondary | ICD-10-CM | POA: Diagnosis not present

## 2016-01-17 DIAGNOSIS — Y939 Activity, unspecified: Secondary | ICD-10-CM | POA: Insufficient documentation

## 2016-01-17 DIAGNOSIS — S93401A Sprain of unspecified ligament of right ankle, initial encounter: Secondary | ICD-10-CM | POA: Diagnosis not present

## 2016-01-17 DIAGNOSIS — Y929 Unspecified place or not applicable: Secondary | ICD-10-CM | POA: Insufficient documentation

## 2016-01-17 DIAGNOSIS — Z87891 Personal history of nicotine dependence: Secondary | ICD-10-CM | POA: Diagnosis not present

## 2016-01-17 DIAGNOSIS — S99911A Unspecified injury of right ankle, initial encounter: Secondary | ICD-10-CM | POA: Diagnosis present

## 2016-01-17 DIAGNOSIS — J449 Chronic obstructive pulmonary disease, unspecified: Secondary | ICD-10-CM | POA: Insufficient documentation

## 2016-01-17 DIAGNOSIS — J45909 Unspecified asthma, uncomplicated: Secondary | ICD-10-CM | POA: Diagnosis not present

## 2016-01-17 MED ORDER — HYDROCODONE-ACETAMINOPHEN 5-325 MG PO TABS
1.0000 | ORAL_TABLET | Freq: Once | ORAL | Status: AC
  Administered 2016-01-17: 1 via ORAL
  Filled 2016-01-17: qty 1

## 2016-01-17 MED ORDER — HYDROCODONE-ACETAMINOPHEN 5-325 MG PO TABS: 1.0000 | ORAL_TABLET | ORAL | Status: DC | PRN

## 2016-01-17 MED FILL — HYDROCODON-APAP 5-325: 5-325 | 2 days supply | Qty: 9 | Fill #0

## 2016-01-17 NOTE — ED Notes (Signed)
No Cane given due to none available in [place] [facility] notified.

## 2016-01-17 NOTE — Discharge Instructions (Signed)
X-rays of your right ankle are normal today. There is no evidence of a fracture or dislocation. Recommend rest, ice, compression, and elevation. Please wear your ankle brace when performing your daily activities. Recommend Tylenol for pain.  You may take Norco every 4 hours as needed for pain.  Please follow up with orthopedics, Dr. Felipe Drone, for ongoing pain.   Ankle Sprain An ankle sprain is an injury to the strong, fibrous tissues (ligaments) that hold the bones of your ankle joint together.  CAUSES An ankle sprain is usually caused by a fall or by twisting your ankle. Ankle sprains most commonly occur when you step on the outer edge of your foot, and your ankle turns inward. People who participate in sports are more prone to these types of injuries.  SYMPTOMS   Pain in your ankle. The pain may be present at rest or only when you are trying to stand or walk.  Swelling.  Bruising. Bruising may develop immediately or within 1 to 2 days after your injury.  Difficulty standing or walking, particularly when turning corners or changing directions. DIAGNOSIS  Your caregiver will ask you details about your injury and perform a physical exam of your ankle to determine if you have an ankle sprain. During the physical exam, your caregiver will press on and apply pressure to specific areas of your foot and ankle. Your caregiver will try to move your ankle in certain ways. An X-ray exam may be done to be sure a bone was not broken or a ligament did not separate from one of the bones in your ankle (avulsion fracture).  TREATMENT  Certain types of braces can help stabilize your ankle. Your caregiver can make a recommendation for this. Your caregiver may recommend the use of medicine for pain. If your sprain is severe, your caregiver may refer you to a surgeon who helps to restore function to parts of your skeletal system (orthopedist) or a physical therapist. Industry ice to your  injury for 1-2 days or as directed by your caregiver. Applying ice helps to reduce inflammation and pain.  Put ice in a plastic bag.  Place a towel between your skin and the bag.  Leave the ice on for 15-20 minutes at a time, every 2 hours while you are awake.  Only take over-the-counter or prescription medicines for pain, discomfort, or fever as directed by your caregiver.  Elevate your injured ankle above the level of your heart as much as possible for 2-3 days.  If your caregiver recommends crutches, use them as instructed. Gradually put weight on the affected ankle. Continue to use crutches or a cane until you can walk without feeling pain in your ankle.  If you have a plaster splint, wear the splint as directed by your caregiver. Do not rest it on anything harder than a pillow for the first 24 hours. Do not put weight on it. Do not get it wet. You may take it off to take a shower or bath.  You may have been given an elastic bandage to wear around your ankle to provide support. If the elastic bandage is too tight (you have numbness or tingling in your foot or your foot becomes cold and blue), adjust the bandage to make it comfortable.  If you have an air splint, you may blow more air into it or let air out to make it more comfortable. You may take your splint off at night and before taking a  shower or bath. Wiggle your toes in the splint several times per day to decrease swelling. SEEK MEDICAL CARE IF:   You have rapidly increasing bruising or swelling.  Your toes feel extremely cold or you lose feeling in your foot.  Your pain is not relieved with medicine. SEEK IMMEDIATE MEDICAL CARE IF:  Your toes are numb or blue.  You have severe pain that is increasing. MAKE SURE YOU:   Understand these instructions.  Will watch your condition.  Will get help right away if you are not doing well or get worse.   This information is not intended to replace advice given to you by your  health care provider. Make sure you discuss any questions you have with your health care provider.   Document Released: 09/29/2005 Document Revised: 10/20/2014 Document Reviewed: 10/11/2011 Elsevier Interactive Patient Education [employer].

## 2016-01-17 NOTE — ED Notes (Signed)
Ice supplied already

## 2016-01-17 NOTE — ED Provider Notes (Signed)
CSN: [id number]     Arrival date & time 01/17/16  1429 History   First MD Initiated Contact with Patient 01/17/16 1442     Chief Complaint  Patient presents with  . Ankle Pain     (Consider location/radiation/quality/duration/timing/severity/associated sxs/prior Treatment) Patient is a 70 y.o. female presenting with ankle pain. The history is provided by the patient.  Ankle Pain Associated symptoms: swelling and tingling   Associated symptoms: no back pain, no decreased ROM, no fever, no muscle weakness, no numbness and no stiffness    Patient presents with sudden onset, constant, moderate right ankle pain after she missed judged the last step and twisted her ankle approximately 11:30 AM today. She has applied ice and taken Tylenol with moderate relief. She notes associated swelling and bruising. Denies numbness or weakness. She is ambulatory, but with pain. Aggravating factors include weightbearing.  Past Medical History  Diagnosis Date  . Lymphocytic colitis   . Cancer ([place])     Kidney cancer-Hypernephroma  . Hypertension   . Elevated cholesterol   . Cervical dysplasia   . Anxiety   . Depression   . COPD (chronic obstructive pulmonary disease) ([place])   . Osteoporosis 08/2015    T score -3.2  . Allergy   . Asthma   . GERD (gastroesophageal reflux disease)   . Glaucoma   . Serum calcium elevated    Past Surgical History  Procedure Laterality Date  . Abdominal hysterectomy  1989    TAH LSO  . Tonsillectomy    . Kidney removed  2002    Left  . Colposcopy    . Oophorectomy      LSO  . Augmentation mammaplasty      Implants removed  . Cosmetic surgery     Family History  Problem Relation Age of Onset  . Hypertension Mother   . Heart disease Mother   . Asthma Mother   . Lung cancer Father   . Cancer Father     lung  . Hypertension Sister   . Cancer Sister     Stem cell cancer  . Hypertension Brother   . Stroke Brother   . Ovarian cancer Maternal Aunt   .  Breast cancer Maternal Aunt     Age 28's  . Cancer Maternal Uncle     Prostate cancer  . Breast cancer Maternal Aunt     Age 53's   Social History  Substance Use Topics  . Smoking status: Former Smoker -- 5.00 packs/day for 10 years    Types: Cigarettes    Quit date: 08/23/1983  . Smokeless tobacco: Never Used  . Alcohol Use: 12.6 oz/week    21 Standard drinks or equivalent per week     Comment: social   OB History    Gravida Para Term Preterm AB TAB SAB Ectopic Multiple Living   1 1 1       1      Review of Systems  Constitutional: Negative for fever.  Musculoskeletal: Positive for joint swelling, arthralgias and gait problem (due to pain). Negative for back pain and stiffness.  All other systems reviewed and are negative.     Allergies  Iodine; Iohexol; Penicillins; and Sulfa antibiotics  Home Medications   Prior to Admission medications   Medication Sig Start Date End Date Taking? Authorizing Provider  ALPRAZolam Duanne Moron) 0.5 MG tablet Take 0.5-1 mg by mouth at bedtime as needed. Sleep. 10/16/15   Historical Provider, MD  amLODipine (NORVASC) 5 MG tablet  TAKE 1/2 TO 1 TABLET DAILY FOR BLOOD PRESSURE 02/21/14   Vicie Mutters, PA-C  budesonide (PULMICORT) 0.25 MG/2ML nebulizer solution Take 2 mLs (0.25 mg total) by nebulization 2 (two) times daily. 12/24/15   Noralee Space, MD  co-enzyme Q-10 50 MG capsule Take 50 mg by mouth daily.    Historical Provider, MD  dorzolamide-timolol (COSOPT) 22.3-6.8 MG/ML ophthalmic solution Place 1 drop into the left eye 2 (two) times daily. 10/09/15   Historical Provider, MD  escitalopram (LEXAPRO) 20 MG tablet Take 20 mg by mouth daily. 07/23/15   Historical Provider, MD  fluticasone furoate-vilanterol (BREO ELLIPTA) 200-25 MCG/INH AEPB Inhale 1 puff into the lungs daily.    Historical Provider, MD  hydrALAZINE (APRESOLINE) 25 MG tablet Take 25 mg by mouth 2 (two) times daily.     Historical Provider, MD  ipratropium-albuterol (DUONEB)  0.5-2.5 (3) MG/3ML SOLN Take 3 mLs by nebulization 2 (two) times daily. 12/24/15   Noralee Space, MD  levocetirizine (XYZAL) 5 MG tablet Take 5 mg by mouth daily.    Historical Provider, MD  levothyroxine (SYNTHROID, LEVOTHROID) 100 MCG tablet Take 100 mcg by mouth daily before breakfast.    Historical Provider, MD  pantoprazole (PROTONIX) 40 MG tablet Take 40 mg by mouth daily.    Historical Provider, MD  Probiotic Product (PROBIOTIC PO) Take 1 tablet by mouth at bedtime.     Historical Provider, MD  VENTOLIN HFA 108 (90 Base) MCG/ACT inhaler Inhale 1-2 puffs into the lungs every 4 (four) hours as needed. Ever  4-6 hours prn cough or wheeze. 10/18/15   Historical Provider, MD   BP 146/79 mmHg  Pulse 70  Temp(Src) 98.5 F (36.9 C) (Oral)  Resp 16  Ht 5\' 6"  (1.676 m)  Wt 71.668 kg  BMI 25.51 kg/m2  SpO2 97% Physical Exam  Constitutional: She is oriented to person, place, and time. She appears well-developed and well-nourished.  HENT:  Head: Normocephalic and atraumatic.  Right Ear: External ear normal.  Left Ear: External ear normal.  Eyes: Conjunctivae are normal. No scleral icterus.  Neck: No tracheal deviation present.  Cardiovascular:  Pulses:      Dorsalis pedis pulses are 2+ on the right side.  Pulmonary/Chest: Effort normal. No respiratory distress.  Abdominal: She exhibits no distension.  Musculoskeletal: Normal range of motion. She exhibits tenderness.       Right ankle: She exhibits swelling and ecchymosis. She exhibits normal range of motion, no deformity and normal pulse. Tenderness. Lateral malleolus, AITFL, CF ligament and posterior TFL tenderness found.       Left foot: Normal.  Compartments soft and compressible.  Neurological: She is alert and oriented to person, place, and time.  Right ankle: 5/5 strength in dorsi/plantar flexion.  FAROM.  Sensation intact.   Skin: Skin is warm and dry.  Psychiatric: She has a normal mood and affect. Her behavior is normal.    ED  Course  Procedures (including critical care time) Labs Review Labs Reviewed - No data to display  Imaging Review Dg Ankle Complete Right  01/17/2016  CLINICAL DATA:  Fall down steps with lateral ankle pain, initial encounter EXAM: RIGHT ANKLE - COMPLETE 3+ VIEW COMPARISON:  None. FINDINGS: Significant lateral soft tissue swelling is noted. No acute fracture or dislocation is seen. Calcaneal spurring is seen. Mild tarsal degenerative changes are noted. IMPRESSION: Soft tissue swelling without acute fracture. Electronically Signed   By: Inez Catalina M.D.   On: 01/17/2016 14:59   I  have personally reviewed and evaluated these images and lab results as part of my medical decision-making.   EKG Interpretation None      MDM   Final diagnoses:  Ankle sprain, left, initial encounter   Neurovascularly intact. Compartments are soft and compressible. Patient placed in a ASO and given a cane. Patient deferred crutches. Recommend RICE treatment.  Discharged with short course Norco. Follow up orthopedics.  Discussed return precautions.  Patient agrees and acknowledges the above plan for discharge.     Gloriann Loan, PA-C 01/17/16 1557  Veryl Speak, MD 01/17/16 [phone]

## 2016-01-17 NOTE — ED Notes (Signed)
Right ankle injury. She fell off a step and twisted her ankle. Swelling noted.

## 2016-01-24 ENCOUNTER — Ambulatory Visit (INDEPENDENT_AMBULATORY_CARE_PROVIDER_SITE_OTHER): Payer: Medicare Other | Admitting: Family Medicine

## 2016-01-24 ENCOUNTER — Encounter: Payer: Self-pay | Admitting: Family Medicine

## 2016-01-24 VITALS — BP 149/98 | HR 94 | Ht 66.0 in | Wt 158.0 lb

## 2016-01-24 DIAGNOSIS — S99911A Unspecified injury of right ankle, initial encounter: Secondary | ICD-10-CM | POA: Diagnosis not present

## 2016-01-24 NOTE — Patient Instructions (Signed)
You have a grade 3 ankle sprain. Ice the area for 15 minutes at a time, 3-4 times a day Aleve 2 tabs twice a day with food OR ibuprofen 3 tabs three times a day with food for pain and inflammation. Elevate above the level of your heart when possible Crutches if needed to help with walking Bear weight as tolerated Use laceup ankle brace to help with stability while you recover from this injury every time you're up and walking around Come out of the boot/brace twice a day to do Up/down and alphabet exercises 2-3 sets of each. In future we will start theraband strengthening exercises - once a day 3 sets of 10. Consider physical therapy for strengthening and balance exercises in the future. If not improving as expected, we may repeat x-rays or consider further testing like an MRI. Follow up with me in 2 weeks for reevaluation.

## 2016-01-28 ENCOUNTER — Telehealth: Payer: Self-pay | Admitting: Pulmonary Disease

## 2016-01-28 DIAGNOSIS — S99911A Unspecified injury of right ankle, initial encounter: Secondary | ICD-10-CM | POA: Insufficient documentation

## 2016-01-28 NOTE — Progress Notes (Signed)
PCP: Sandi Mariscal, MD  Subjective:   HPI: Patient is a 70 y.o. female here for right ankle injury.  Patient reports she was coming down the stairs on 4/6, misjudged the last step and severely inverted her right ankle. Unable to bear weight after this. + swelling and bruising. Pain level now 6/10, sharp and lateral. No prior injuries. No skin changes, numbness.  Past Medical History  Diagnosis Date  . Lymphocytic colitis   . Cancer ([place])     Kidney cancer-Hypernephroma  . Hypertension   . Elevated cholesterol   . Cervical dysplasia   . Anxiety   . Depression   . COPD (chronic obstructive pulmonary disease) ([place])   . Osteoporosis 08/2015    T score -3.2  . Allergy   . Asthma   . GERD (gastroesophageal reflux disease)   . Glaucoma   . Serum calcium elevated     Current Outpatient Prescriptions on File Prior to Visit  Medication Sig Dispense Refill  . ALPRAZolam (XANAX) 0.5 MG tablet Take 0.5-1 mg by mouth at bedtime as needed. Sleep.  0  . amLODipine (NORVASC) 5 MG tablet TAKE 1/2 TO 1 TABLET DAILY FOR BLOOD PRESSURE 30 tablet 4  . budesonide (PULMICORT) 0.25 MG/2ML nebulizer solution Take 2 mLs (0.25 mg total) by nebulization 2 (two) times daily. 60 mL 12  . co-enzyme Q-10 50 MG capsule Take 50 mg by mouth daily.    . dorzolamide-timolol (COSOPT) 22.3-6.8 MG/ML ophthalmic solution Place 1 drop into the left eye 2 (two) times daily.  3  . escitalopram (LEXAPRO) 20 MG tablet Take 20 mg by mouth daily.  1  . fluticasone furoate-vilanterol (BREO ELLIPTA) 200-25 MCG/INH AEPB Inhale 1 puff into the lungs daily.    . hydrALAZINE (APRESOLINE) 25 MG tablet Take 25 mg by mouth 2 (two) times daily.     Marland Kitchen HYDROcodone-acetaminophen (NORCO/VICODIN) 5-325 MG tablet Take 1 tablet by mouth every 4 (four) hours as needed. 9 tablet 0  . ipratropium-albuterol (DUONEB) 0.5-2.5 (3) MG/3ML SOLN Take 3 mLs by nebulization 2 (two) times daily. 60 mL 11  . levocetirizine (XYZAL) 5 MG tablet Take 5 mg by  mouth daily.    Marland Kitchen levothyroxine (SYNTHROID, LEVOTHROID) 100 MCG tablet Take 100 mcg by mouth daily before breakfast.    . pantoprazole (PROTONIX) 40 MG tablet Take 40 mg by mouth daily.    . Probiotic Product (PROBIOTIC PO) Take 1 tablet by mouth at bedtime.     . VENTOLIN HFA 108 (90 Base) MCG/ACT inhaler Inhale 1-2 puffs into the lungs every 4 (four) hours as needed. Ever  4-6 hours prn cough or wheeze.  0   No current facility-administered medications on file prior to visit.    Past Surgical History  Procedure Laterality Date  . Abdominal hysterectomy  1989    TAH LSO  . Tonsillectomy    . Kidney removed  2002    Left  . Colposcopy    . Oophorectomy      LSO  . Augmentation mammaplasty      Implants removed  . Cosmetic surgery     Allergies: Iodine, Iohexol, Sulfa, PCN  Social History   Social History  . Marital Status: Married    Spouse Name: N/A  . Number of Children: N/A  . Years of Education: N/A   Occupational History  . Not on file.   Social History Main Topics  . Smoking status: Former Smoker -- 5.00 packs/day for 10 years    Types:  Cigarettes    Quit date: 08/23/1983  . Smokeless tobacco: Never Used  . Alcohol Use: 12.6 oz/week    21 Standard drinks or equivalent per week     Comment: social  . Drug Use: No  . Sexual Activity: Yes    Birth Control/ Protection: Surgical     Comment: HYST   Other Topics Concern  . Not on file   Social History Narrative    Family History  Problem Relation Age of Onset  . Hypertension Mother   . Heart disease Mother   . Asthma Mother   . Lung cancer Father   . Cancer Father     lung  . Hypertension Sister   . Cancer Sister     Stem cell cancer  . Hypertension Brother   . Stroke Brother   . Ovarian cancer Maternal Aunt   . Breast cancer Maternal Aunt     Age 66's  . Cancer Maternal Uncle     Prostate cancer  . Breast cancer Maternal Aunt     Age 45's    BP 149/98 mmHg  Pulse 94  Ht 5\' 6"  (1.676 m)   Wt 158 lb (71.668 kg)  BMI 25.51 kg/m2  Review of Systems: See HPI above.    Objective:  Physical Exam:  Gen: NAD, comfortable in exam room  Right ankle: Mod swelling, bruising.  No other deformity. Mod limitation ROM all directions. TTP over ATFL 2+ ant drawer and talar tilt.   Negative syndesmotic compression. Thompsons test negative. NV intact distally.  Left ankle: FROM without pain.    Assessment & Plan:  1. Right ankle injury - independently reviewed radiographs and no evidence fracture.  Consistent with high grade sprain.  Icing, nsaids, elevation.  ASO with crutches as needed.  Shown home exercises to do daily.  F/u in 2 weeks.  Consider physical therapy in future.

## 2016-01-28 NOTE — Assessment & Plan Note (Signed)
independently reviewed radiographs and no evidence fracture.  Consistent with high grade sprain.  Icing, nsaids, elevation.  ASO with crutches as needed.  Shown home exercises to do daily.  F/u in 2 weeks.  Consider physical therapy in future.

## 2016-01-28 NOTE — Telephone Encounter (Signed)
Atc, office was closed.  wcb tomorrow.

## 2016-01-29 ENCOUNTER — Institutional Professional Consult (permissible substitution): Payer: Medicare Other | Admitting: Pulmonary Disease

## 2016-01-29 ENCOUNTER — Telehealth: Payer: Self-pay | Admitting: Pulmonary Disease

## 2016-01-29 NOTE — Telephone Encounter (Signed)
Spoke with the pt  She states nothing needed, she has already spoken with Joy at the surgery center  She will call if needed See 01/28/16 PN

## 2016-01-29 NOTE — Telephone Encounter (Signed)
Per Dr. Lenna Gilford:  Optimize prior to surgery by using DuoNeb QID + Budesonide BID for week prior to surgery or patient can do Breo 1 puff daily, Incruse 1 puff daily > pt states this is too expensive, this is why patient was switched to nebulizer treatments.  -------------- Left message for Blanch Media to call back. Attempted to contact patient as well, left message to call back.

## 2016-01-29 NOTE — Telephone Encounter (Signed)
lmtcb X1 for Blanch Media at surgical center

## 2016-01-29 NOTE — Telephone Encounter (Signed)
LM for Blanch Media [phone]

## 2016-01-29 NOTE — Telephone Encounter (Signed)
Surgical center returning call and can be reached @ [phone] ext [phone] [email or web address]

## 2016-01-29 NOTE — Telephone Encounter (Signed)
Spoke with Blanch Media with [facility].  Pt is scheduled for sinus surgery on 02/01/16 under general anesthesia.  Blanch Media received last OV note stating "ok for surg."  Dr. Quillian Quince, anesthesiologist, is is asking if SN has any additional suggestions for optimizing pt before surgery.  Dr. Lenna Gilford, please advise.  Thank you.    Please leave detailed msg on Joyce's VM when calling back.  Also, please fax to her attn any additional suggestions SN may have to [phone].

## 2016-01-29 NOTE — Telephone Encounter (Signed)
Received call back from [place] at Surgery center.  Advised her of Dr. Jeannine Kitten recommendations.  She said that she would discuss this with the surgeon and will advise patient what to do.

## 2016-02-01 ENCOUNTER — Other Ambulatory Visit (INDEPENDENT_AMBULATORY_CARE_PROVIDER_SITE_OTHER): Payer: Self-pay | Admitting: Otolaryngology

## 2016-02-01 DIAGNOSIS — J343 Hypertrophy of nasal turbinates: Secondary | ICD-10-CM | POA: Diagnosis not present

## 2016-02-01 DIAGNOSIS — J338 Other polyp of sinus: Secondary | ICD-10-CM | POA: Diagnosis not present

## 2016-02-01 DIAGNOSIS — J329 Chronic sinusitis, unspecified: Secondary | ICD-10-CM | POA: Diagnosis not present

## 2016-02-01 DIAGNOSIS — J342 Deviated nasal septum: Secondary | ICD-10-CM | POA: Diagnosis not present

## 2016-02-01 DIAGNOSIS — J320 Chronic maxillary sinusitis: Secondary | ICD-10-CM | POA: Diagnosis not present

## 2016-02-01 DIAGNOSIS — J310 Chronic rhinitis: Secondary | ICD-10-CM | POA: Diagnosis not present

## 2016-02-01 DIAGNOSIS — J3489 Other specified disorders of nose and nasal sinuses: Secondary | ICD-10-CM | POA: Diagnosis not present

## 2016-02-01 DIAGNOSIS — J322 Chronic ethmoidal sinusitis: Secondary | ICD-10-CM | POA: Diagnosis not present

## 2016-02-05 DIAGNOSIS — J322 Chronic ethmoidal sinusitis: Secondary | ICD-10-CM | POA: Diagnosis not present

## 2016-02-05 DIAGNOSIS — J320 Chronic maxillary sinusitis: Secondary | ICD-10-CM | POA: Diagnosis not present

## 2016-02-05 DIAGNOSIS — J338 Other polyp of sinus: Secondary | ICD-10-CM | POA: Diagnosis not present

## 2016-02-07 ENCOUNTER — Ambulatory Visit: Payer: Medicare Other | Admitting: Family Medicine

## 2016-02-08 ENCOUNTER — Other Ambulatory Visit: Payer: Self-pay | Admitting: Emergency Medicine

## 2016-02-08 MED ORDER — BUDESONIDE 0.25 MG/2ML IN SUSP: 0.2500 mg | Freq: Two times a day (BID) | RESPIRATORY_TRACT | Status: DC

## 2016-02-08 MED ORDER — IPRATROPIUM-ALBUTEROL 0.5-2.5 (3) MG/3ML IN SOLN: 3.0000 mL | Freq: Two times a day (BID) | RESPIRATORY_TRACT | Status: DC

## 2016-02-11 DIAGNOSIS — I10 Essential (primary) hypertension: Secondary | ICD-10-CM | POA: Diagnosis not present

## 2016-02-11 DIAGNOSIS — J449 Chronic obstructive pulmonary disease, unspecified: Secondary | ICD-10-CM | POA: Diagnosis not present

## 2016-02-11 DIAGNOSIS — E559 Vitamin D deficiency, unspecified: Secondary | ICD-10-CM | POA: Diagnosis not present

## 2016-02-11 DIAGNOSIS — E0590 Thyrotoxicosis, unspecified without thyrotoxic crisis or storm: Secondary | ICD-10-CM | POA: Diagnosis not present

## 2016-02-11 DIAGNOSIS — F411 Generalized anxiety disorder: Secondary | ICD-10-CM | POA: Diagnosis not present

## 2016-02-14 ENCOUNTER — Encounter: Payer: Self-pay | Admitting: Family Medicine

## 2016-02-14 ENCOUNTER — Ambulatory Visit (INDEPENDENT_AMBULATORY_CARE_PROVIDER_SITE_OTHER): Payer: Medicare Other | Admitting: Family Medicine

## 2016-02-14 ENCOUNTER — Telehealth: Payer: Self-pay | Admitting: *Deleted

## 2016-02-14 VITALS — BP 136/78 | HR 64 | Ht 66.0 in | Wt 160.0 lb

## 2016-02-14 DIAGNOSIS — S99911D Unspecified injury of right ankle, subsequent encounter: Secondary | ICD-10-CM | POA: Diagnosis not present

## 2016-02-14 NOTE — Telephone Encounter (Signed)
-----   Message from Huel Cote, NP sent at 02/13/2016  4:54 PM EDT ----- Please call her and remind her to get PTH recheck, she had elevated PTH in December. She had nasal surgery last month.

## 2016-02-14 NOTE — Telephone Encounter (Signed)
Left message on pt voicemail for her to call.

## 2016-02-14 NOTE — Patient Instructions (Signed)
You have a grade 3 ankle sprain. Icing, aleve/ibuprofen only if needed now. Use laceup ankle brace when out of the house or when you're doing a lot of walking for about 4 more weeks. Start the theraband strengthening exercises 3 sets of 10 once a day for 4 weeks. Call me if you have any problems otherwise follow up as needed.

## 2016-02-15 NOTE — Progress Notes (Signed)
PCP: Sandi Mariscal, MD  Subjective:   HPI: Patient is a 70 y.o. female here for right ankle injury.  4/13: Patient reports she was coming down the stairs on 4/6, misjudged the last step and severely inverted her right ankle. Unable to bear weight after this. + swelling and bruising. Pain level now 6/10, sharp and lateral. No prior injuries. No skin changes, numbness.  5/4: Patient reports her right ankle has improved. Pain currently 0/10. Worse walking down stairs - about the only time she gets pain. Doing home exercises and wearing ASO. No skin changes, numbness.  Past Medical History  Diagnosis Date  . Lymphocytic colitis   . Cancer ([place])     Kidney cancer-Hypernephroma  . Hypertension   . Elevated cholesterol   . Cervical dysplasia   . Anxiety   . Depression   . COPD (chronic obstructive pulmonary disease) ([place])   . Osteoporosis 08/2015    T score -3.2  . Allergy   . Asthma   . GERD (gastroesophageal reflux disease)   . Glaucoma   . Serum calcium elevated     Current Outpatient Prescriptions on File Prior to Visit  Medication Sig Dispense Refill  . ALPRAZolam (XANAX) 0.5 MG tablet Take 0.5-1 mg by mouth at bedtime as needed. Sleep.  0  . amLODipine (NORVASC) 5 MG tablet TAKE 1/2 TO 1 TABLET DAILY FOR BLOOD PRESSURE 30 tablet 4  . BESIVANCE 0.6 % SUSP     . budesonide (PULMICORT) 0.25 MG/2ML nebulizer solution Take 2 mLs (0.25 mg total) by nebulization 2 (two) times daily. 60 mL 12  . co-enzyme Q-10 50 MG capsule Take 50 mg by mouth daily.    . dorzolamide-timolol (COSOPT) 22.3-6.8 MG/ML ophthalmic solution Place 1 drop into the left eye 2 (two) times daily.  3  . escitalopram (LEXAPRO) 20 MG tablet Take 20 mg by mouth daily.  1  . fluticasone furoate-vilanterol (BREO ELLIPTA) 200-25 MCG/INH AEPB Inhale 1 puff into the lungs daily.    . hydrALAZINE (APRESOLINE) 25 MG tablet Take 25 mg by mouth 2 (two) times daily.     Marland Kitchen HYDROcodone-acetaminophen (NORCO/VICODIN) 5-325  MG tablet Take 1 tablet by mouth every 4 (four) hours as needed. 9 tablet 0  . ipratropium-albuterol (DUONEB) 0.5-2.5 (3) MG/3ML SOLN Take 3 mLs by nebulization 2 (two) times daily. 60 mL 11  . levocetirizine (XYZAL) 5 MG tablet Take 5 mg by mouth daily.    Marland Kitchen levothyroxine (SYNTHROID, LEVOTHROID) 100 MCG tablet Take 100 mcg by mouth daily before breakfast.    . pantoprazole (PROTONIX) 40 MG tablet Take 40 mg by mouth daily.    . Probiotic Product (PROBIOTIC PO) Take 1 tablet by mouth at bedtime.     . VENTOLIN HFA 108 (90 Base) MCG/ACT inhaler Inhale 1-2 puffs into the lungs every 4 (four) hours as needed. Ever  4-6 hours prn cough or wheeze.  0   No current facility-administered medications on file prior to visit.    Past Surgical History  Procedure Laterality Date  . Abdominal hysterectomy  1989    TAH LSO  . Tonsillectomy    . Kidney removed  2002    Left  . Colposcopy    . Oophorectomy      LSO  . Augmentation mammaplasty      Implants removed  . Cosmetic surgery     Allergies: Iodine, Iohexol, Sulfa, PCN  Social History   Social History  . Marital Status: Married    Spouse Name:  N/A  . Number of Children: N/A  . Years of Education: N/A   Occupational History  . Not on file.   Social History Main Topics  . Smoking status: Former Smoker -- 5.00 packs/day for 10 years    Types: Cigarettes    Quit date: 08/23/1983  . Smokeless tobacco: Never Used  . Alcohol Use: 12.6 oz/week    21 Standard drinks or equivalent per week     Comment: social  . Drug Use: No  . Sexual Activity: Yes    Birth Control/ Protection: Surgical     Comment: HYST   Other Topics Concern  . Not on file   Social History Narrative    Family History  Problem Relation Age of Onset  . Hypertension Mother   . Heart disease Mother   . Asthma Mother   . Lung cancer Father   . Cancer Father     lung  . Hypertension Sister   . Cancer Sister     Stem cell cancer  . Hypertension Brother    . Stroke Brother   . Ovarian cancer Maternal Aunt   . Breast cancer Maternal Aunt     Age 42's  . Cancer Maternal Uncle     Prostate cancer  . Breast cancer Maternal Aunt     Age 16's    BP 136/78 mmHg  Pulse 64  Ht 5\' 6"  (1.676 m)  Wt 160 lb (72.576 kg)  BMI 25.84 kg/m2  Review of Systems: See HPI above.    Objective:  Physical Exam:  Gen: NAD, comfortable in exam room  Right ankle: Mild lateral swelling, no bruising or other deformity. FROM No TTP over ATFL or elsewhere about foot/ankle. 2+ ant drawer and talar tilt.   Negative syndesmotic compression. Thompsons test negative. NV intact distally.  Left ankle: FROM without pain.    Assessment & Plan:  1. Right ankle injury - independently reviewed radiographs and no evidence fracture.  Doing much better than expected at this point.  Continue with ASO, shown strengthening exercises with theraband to do daily - both for 4 more weeks.  Icing, aleve as needed.  F/u prn.

## 2016-02-15 NOTE — Assessment & Plan Note (Signed)
independently reviewed radiographs and no evidence fracture.  Doing much better than expected at this point.  Continue with ASO, shown strengthening exercises with theraband to do daily - both for 4 more weeks.  Icing, aleve as needed.  F/u prn.

## 2016-02-19 DIAGNOSIS — J322 Chronic ethmoidal sinusitis: Secondary | ICD-10-CM | POA: Diagnosis not present

## 2016-02-19 DIAGNOSIS — J320 Chronic maxillary sinusitis: Secondary | ICD-10-CM | POA: Diagnosis not present

## 2016-02-19 DIAGNOSIS — J338 Other polyp of sinus: Secondary | ICD-10-CM | POA: Diagnosis not present

## 2016-02-28 ENCOUNTER — Ambulatory Visit: Payer: Medicare Other | Admitting: Pulmonary Disease

## 2016-03-04 ENCOUNTER — Other Ambulatory Visit: Payer: Self-pay | Admitting: Women's Health

## 2016-03-04 ENCOUNTER — Other Ambulatory Visit: Payer: Medicare Other

## 2016-03-04 ENCOUNTER — Telehealth: Payer: Self-pay | Admitting: Gynecology

## 2016-03-04 DIAGNOSIS — Z13820 Encounter for screening for osteoporosis: Secondary | ICD-10-CM

## 2016-03-04 NOTE — Telephone Encounter (Signed)
Prolia due after 04/01/16 . Calcium 9.2 on 08/21/2015  . Ins benefits Deductible $183 (met), Co insu 20% approx $420. Secondary insurance covers deductible and 100% co insurance . TOTAL PT COST $0. VM left for patient.

## 2016-03-05 ENCOUNTER — Encounter: Payer: Self-pay | Admitting: Gynecology

## 2016-03-05 LAB — PTH, INTACT AND CALCIUM
CALCIUM: 8.5 mg/dL (ref 8.4–10.5)
PTH: 156 pg/mL — ABNORMAL HIGH (ref 14–64)

## 2016-03-05 NOTE — Telephone Encounter (Signed)
Injection scheduled 04/02/16 at 2 30 Nurse only

## 2016-03-06 ENCOUNTER — Ambulatory Visit: Payer: Medicare Other | Admitting: Pulmonary Disease

## 2016-03-06 ENCOUNTER — Telehealth: Payer: Self-pay | Admitting: *Deleted

## 2016-03-06 DIAGNOSIS — M810 Age-related osteoporosis without current pathological fracture: Secondary | ICD-10-CM

## 2016-03-06 DIAGNOSIS — R7989 Other specified abnormal findings of blood chemistry: Secondary | ICD-10-CM

## 2016-03-06 LAB — VITAMIN D 25 HYDROXY (VIT D DEFICIENCY, FRACTURES): VIT D 25 HYDROXY: 40 ng/mL (ref 30–100)

## 2016-03-06 NOTE — Telephone Encounter (Signed)
Pt has this done on 03/04/16

## 2016-03-06 NOTE — Telephone Encounter (Signed)
Per nancy young result note on 03/04/16 "She will need referral to endocrinologist" referral placed at [facility] endocrinologist. I left on pt voicemail they will contact her to schedule.

## 2016-03-07 NOTE — Telephone Encounter (Signed)
Appt. 04/28/16 @ 1:15 pm

## 2016-03-13 ENCOUNTER — Telehealth: Payer: Self-pay | Admitting: Pulmonary Disease

## 2016-03-13 DIAGNOSIS — L820 Inflamed seborrheic keratosis: Secondary | ICD-10-CM | POA: Diagnosis not present

## 2016-03-13 DIAGNOSIS — Z1283 Encounter for screening for malignant neoplasm of skin: Secondary | ICD-10-CM | POA: Diagnosis not present

## 2016-03-13 DIAGNOSIS — X32XXXA Exposure to sunlight, initial encounter: Secondary | ICD-10-CM | POA: Diagnosis not present

## 2016-03-13 DIAGNOSIS — L570 Actinic keratosis: Secondary | ICD-10-CM | POA: Diagnosis not present

## 2016-03-13 DIAGNOSIS — Z85820 Personal history of malignant melanoma of skin: Secondary | ICD-10-CM | POA: Diagnosis not present

## 2016-03-13 DIAGNOSIS — Z08 Encounter for follow-up examination after completed treatment for malignant neoplasm: Secondary | ICD-10-CM | POA: Diagnosis not present

## 2016-03-13 MED ORDER — INCRUSE ELLIPTA 62.5 MCG/INH IN AEPB: 1.0000 | INHALATION_SPRAY | Freq: Every day | RESPIRATORY_TRACT | Status: DC

## 2016-03-13 MED ORDER — FLUTICASONE FUROATE-VILANTEROL 200-25 MCG/INH IN AEPB: 1.0000 | INHALATION_SPRAY | Freq: Every day | RESPIRATORY_TRACT | Status: DC

## 2016-03-13 NOTE — Telephone Encounter (Signed)
Per SN >> give 1 sample of each and send in prescriptions for both.  Pt has been made aware of the above information by message per her request. Samples will be left up front. Nothing further was needed.

## 2016-03-13 NOTE — Telephone Encounter (Signed)
Patient calling back regarding samples. -prm °

## 2016-03-13 NOTE — Telephone Encounter (Signed)
Spoke with pt. States that the nebulized medications are not working for her. She is requesting samples on Breo and Incruse. Pt can't afford to buy these medications at this time. Has an upcoming appointment on 03/17/16.  SN - please advise. Thanks.

## 2016-03-13 NOTE — Telephone Encounter (Signed)
At her last OV, SN changed her to nebulizer medications. lmtcb x1 for pt.

## 2016-03-17 ENCOUNTER — Telehealth: Payer: Self-pay | Admitting: Pulmonary Disease

## 2016-03-17 ENCOUNTER — Ambulatory Visit (INDEPENDENT_AMBULATORY_CARE_PROVIDER_SITE_OTHER): Payer: Medicare Other | Admitting: Pulmonary Disease

## 2016-03-17 ENCOUNTER — Encounter: Payer: Self-pay | Admitting: Pulmonary Disease

## 2016-03-17 VITALS — BP 118/76 | HR 73 | Ht 66.0 in | Wt 160.6 lb

## 2016-03-17 DIAGNOSIS — J449 Chronic obstructive pulmonary disease, unspecified: Secondary | ICD-10-CM | POA: Diagnosis not present

## 2016-03-17 DIAGNOSIS — J45909 Unspecified asthma, uncomplicated: Secondary | ICD-10-CM

## 2016-03-17 DIAGNOSIS — J309 Allergic rhinitis, unspecified: Secondary | ICD-10-CM | POA: Insufficient documentation

## 2016-03-17 DIAGNOSIS — J339 Nasal polyp, unspecified: Secondary | ICD-10-CM | POA: Diagnosis not present

## 2016-03-17 DIAGNOSIS — J301 Allergic rhinitis due to pollen: Secondary | ICD-10-CM

## 2016-03-17 MED ORDER — IPRATROPIUM-ALBUTEROL 0.5-2.5 (3) MG/3ML IN SOLN: 3.0000 mL | Freq: Four times a day (QID) | RESPIRATORY_TRACT | Status: DC

## 2016-03-17 NOTE — Telephone Encounter (Signed)
This has been signed by SN.  Will sign off.

## 2016-03-17 NOTE — Progress Notes (Signed)
Subjective:     Patient ID: [name] [name], female   DOB: 1946-06-06, 70 y.o.   MRN: [id number]  HPI    70 y/o WF, ex-smoker w/ a hx of asthma & GOLD Stage 2 COPD, referred by [name] [name] for a pre-op respiratory evaluation...   ~  December 24, 2015:  Initial pulmonary consult by SN>      Her PCP is Dr. Sandi [name] at [facility] followed for AR/ Asthma/ COPD, HBP, HL, Hypothyroid, GERD, Hx kidney cancer- s/p left nephrectomy, Anxiety/Depression...    70 y/o WF referred by [name] [name], ENT for pre-op pulmonary clearance for planned ENT surg (see below)>     [name] [name] has a min smoking hx & quit in 1984; she was prev treated by [name] [name] on [id number], then moved to [place], then back to [place] and her new PCP [name] [name] gave her Memory [employer] & she really likes this med but they are all >$300 on her insurance; she notes min cough, small amt clear sput, DOE w/ exertion but does ok w/ usual activities and ADLs; she had a bronchitic exac 10/2015 w/ some discolored sput & streaky hemoptysis & was seen in the ER-- Symbicort & Ventolin were listed as her meds, Chest was clear on exam, CXR was clear/NAD and LABS were also wnl including D-dimer; Given NEB rx and antibiotic & improved; she noted some SOB but mostly because she couldn't breathe thru her nose=> referred to [name] [name], ENT...    She saw [name] [name] yrs ago- last 2009 & avail records indicate hx Asthma w/ COPD & mult exac treated w/ Pred etc; she had DOE & faint wheezing on exam; Hx GERD treated by [name] [name]    She saw [name] [name] w/ facial pressure & nasal obstruction- eval w/ CT Sinus & flex endoscopy revealed bilat nasal polyps, deviated septum, bilat inferior turbinate hypertrophy, & a large right concha bullosa;  He plans surg for all these findings and requests pre-op pulmonary eval...     Note: there is a mention of "mild to mod OSA" diagnosed on a sleep study in 2015 & a letter from [facility] for an oral sleep appliance; none of the original data is avail to [place] &  the pt never received any treatment for OSA...  Smoking Hx>  Ex-smoker, smoked for ~10 yrs, up to 1/2 PPD, quit smoking in 1984 for ~5pack-yr smoking hx...  Pulmonary Hx>  She sees [name] [name] for Allergy (we do not have his records); told to have asthma and COPD in the past; said to have OSA as well (we do not have any of this data).  Medical Hx>  HBP, HL, Hypothyroid, GERD, Hx kidney cancer- s/p left nephrectomy, Anxiety/Depression  Family Hx>  [name] [name] had lung cancer, [name] [name] had asthma; her [place] were [place].  Occup Hx>  No known exposure to asbestos or any toxins.  Current Meds>  Xyzal5, VentolinHFA, Breo, Liberal, Apres25, Protonix40, Synthroid100, Prolia, Alpraz0.5, Lexapro20  EXAM shows Afeb, VSS, O2sat=96% onRA;  HEENT- nasal obstruction/ congestion, mallampati3;  Chest- few bibasilar rhonchi w/o w/r/consolidation;  Heart- RR w/o m/r/g;  Abd- soft, nontender, neg;  Ext- w/o c/c/e;  Neuro- intact...  Last CXR in Epic was 11/08/15>  Norm heart size, clear sl hyperexpanded lungs, mild scoliosis, NAD...  Spirometry 12/24/15>  FVC=2.40 (79%), FEV1=1.25 (54%), %1sec=52, mid-flows reduced at 32% predicted... c/w moderate airflow obstruction and GOLD Stage 2 COPD...  Ambulatory oximetry 12/24/15>  O2sat=100% on RA at rest w/ pulse=69/min;  She ambulated 3  Laps in the office w/ lowest O2say=98% w/ pulse=97/min... No desaturations...  IMP >>     1) COPD-- mod persistent, chronic bronchitic type, with a reversible component     2) Hx allergic rhinitis-- treated by [name] [name], we do not have his notes    3) Hx OSA-- sleep study and rx per [name] [name] in 2015, we do not have any of this data    4) ENT-- per [name] [name] w/ facial pressure & nasal obstruction- eval w/ CT Sinus & flex endoscopy revealed bilat nasal polyps, deviated septum, bilat inferior turbinate hypertrophy, & a large right concha bullosa    5) Medical issues-- per [name] [name] w/ HBP, HL, Hypothyroid, GERD, Hx kidney cancer-  s/p left nephrectomy, osteopenia, Anxiety/Depression PLAN >>     [name] [name] has done well on the Breo one inhalation daily but the $300/mo is prohibitive; we discussed trial of NEBULIZED medications to see if they work as well and are less expensive; In this regard- start DUONEB BID via Nebulizer and BUDESONIDE 0.25 BID via Nebulizer; she will continue to use the VentolinHFA as needed and her other meds as listed... we plan an ROV recheck in 33mo sooner if needed for acute problems, she is OK for surg...  ~  March 17, 2016:  310moOV w/ SN>  [name] Gunningeports that she had her sinus surg by [name] [name] (polyps removed, inferior nasal turbinates reduced, and dev septum repaired) and she is able to breathe thru her nose much better & as a result she feels that her breathing overall is much improved;  When she was here 3/13- we tried to improve her meds/ costs by changing her Breo/ Incruse to NEBS w/ Duoneb Bid & Budes Bid, but she notes that this wasn't holding her as well (prob due to the shorter acting B-agonist & anticholinergic in Duoneb);  I have rec to pt that she can increase the Duoneb med to Qid if needed, and continue the Budesonide Bid- she prefers this to Breo/ Incruse to to the cost of the latter inhalers... We reviewed the following medical problems during today's office visit >>     COPD/ AB-- mod persistent, chronic bronchitic type, with a reversible component; FEV1 12/2015= 1.25 (54%); she quit smoking 1984 & has only a 5 pack-yr smoking hx; due to cost issues we have tried to optimize her therapy w/ min expense & rec NEBS w/ DUONEB Qid and BUDESONIDE 0.25Bid; she has a PrProgramme researcher, broadcasting/film/videonhaler for prn use when out & about...    Hx allergic rhinitis-- treated by [name] [name], we do not have his notes.    Hx OSA-- sleep study and rx per [name] [name] in 2015, we do not have any of this data.    ENT-- per [name] [name] w/ facial pressure & nasal obstruction- eval w/ CT Sinus & flex endoscopy revealed bilat nasal polyps, deviated  septum, bilat inferior turbinate hypertrophy, & a large right concha bullosa => s/p endoscopic sinus surg 01/2016 and she reports much improved & able to breathe thru her nose...     Medical issues-- per [name] [name] w/ HBP, HL, Hypothyroid, GERD, Hx kidney cancer- s/p left nephrectomy, osteopenia, Anxiety/Depression EXAM shows Afeb, VSS, O2sat=97% onRA;  HEENT- imroved nasal airway s/p surg, mallampati3;  Chest- clear w/o w/r/r;  Heart- RR w/o m/r/g;  Abd- soft, nontender, neg;  Ext- w/o c/c/e;  Neuro- intact... IMP/PLAN>>  Problems as noted, she is improved overall but the Bid NEB sched is not working long enough- she still prefers the NEBs over  the inhalers due to cost; Rec to incr Duoneb to Qid, continue Budesonide Bid;  SHE NEEDS A NEW SMALLER PORTABLE NEB UNIT=> we will request thru her DME company;  She will try to get [place] sleep apnea data (sleep study date & results, machine info & download data;  We plan ROV in 60mo, sooner if needed prn...    Past Medical History  Diagnosis Date  . Lymphocytic colitis   . Cancer ([place])     Kidney cancer-Hypernephroma  . Hypertension   . Elevated cholesterol   . Cervical dysplasia   . Anxiety   . Depression   . COPD (chronic obstructive pulmonary disease) ([place])   . Osteoporosis 08/2015    T score -3.2  . Allergy   . Asthma   . GERD (gastroesophageal reflux disease)   . Glaucoma   . Serum calcium elevated     Past Surgical History  Procedure Laterality Date  . Abdominal hysterectomy  1989    TAH LSO  . Tonsillectomy    . Kidney removed  2002    Left  . Colposcopy    . Oophorectomy      LSO  . Augmentation mammaplasty      Implants removed  . Cosmetic surgery      Outpatient Encounter Prescriptions as of 03/17/2016  Medication Sig  . ALPRAZolam (XANAX) 0.5 MG tablet Take 0.5-1 mg by mouth at bedtime as needed. Sleep.  Marland Kitchen amLODipine (NORVASC) 5 MG tablet TAKE 1/2 TO 1 TABLET DAILY FOR BLOOD PRESSURE  . BESIVANCE 0.6 % SUSP   . budesonide  (PULMICORT) 0.25 MG/2ML nebulizer solution Take 2 mLs (0.25 mg total) by nebulization 2 (two) times daily.  Marland Kitchen co-enzyme Q-10 50 MG capsule Take 50 mg by mouth daily.  . dorzolamide-timolol (COSOPT) 22.3-6.8 MG/ML ophthalmic solution Place 1 drop into the left eye 2 (two) times daily.  Marland Kitchen escitalopram (LEXAPRO) 20 MG tablet Take 20 mg by mouth daily.  . fluticasone furoate-vilanterol (BREO ELLIPTA) 200-25 MCG/INH AEPB Inhale 1 puff into the lungs daily.  . hydrALAZINE (APRESOLINE) 25 MG tablet Take 25 mg by mouth 2 (two) times daily.   Marland Kitchen HYDROcodone-acetaminophen (NORCO/VICODIN) 5-325 MG tablet Take 1 tablet by mouth every 4 (four) hours as needed.  . INCRUSE ELLIPTA 62.5 MCG/INH AEPB Inhale 1 puff into the lungs daily.  Marland Kitchen ipratropium-albuterol (DUONEB) 0.5-2.5 (3) MG/3ML SOLN Take 3 mLs by nebulization 4 (four) times daily.  Marland Kitchen levocetirizine (XYZAL) 5 MG tablet Take 5 mg by mouth daily.  Marland Kitchen levothyroxine (SYNTHROID, LEVOTHROID) 100 MCG tablet Take 100 mcg by mouth daily before breakfast.  . pantoprazole (PROTONIX) 40 MG tablet Take 40 mg by mouth daily.  . Probiotic Product (PROBIOTIC PO) Take 1 tablet by mouth at bedtime.   . VENTOLIN HFA 108 (90 Base) MCG/ACT inhaler Inhale 1-2 puffs into the lungs every 4 (four) hours as needed. Ever  4-6 hours prn cough or wheeze.  . [DISCONTINUED] ipratropium-albuterol (DUONEB) 0.5-2.5 (3) MG/3ML SOLN Take 3 mLs by nebulization 2 (two) times daily.   No facility-administered encounter medications on file as of 03/17/2016.    Allergies  Allergen Reactions  . Iodine     Pt has only one kidney.  . Iohexol      Code: RASH, Desc: PT STATES SHE HAS ONE KIDNEY SO NOT TO USE IV DYE/10/27/06/RM, Onset Date: [id number]   . Penicillins     Has patient had a PCN reaction causing immediate rash, facial/tongue/throat swelling, SOB or lightheadedness with hypotension: (  No) Has patient had a PCN reaction causing severe rash involving mucus membranes or skin necrosis:  (No) Has patient had a PCN reaction that required hospitalization (No) Has patient had a PCN reaction occurring within the last 10 years: (No) Pt states she has historically tolerated PCN, but tested positive for allergy.   If all of the above answers are "NO", then may proce  . Sulfa Antibiotics Nausea Only    Immunization History  Administered Date(s) Administered  . Influenza Split 07/18/2015  . Influenza,inj,Quad PF,36+ Mos 09/27/2013  . Influenza-Unspecified 07/13/2012  . Pneumococcal Polysaccharide-23 09/12/2006    Current Medications, Allergies, Past Medical History, Past Surgical History, Family History, and Social History were reviewed in [employer] record.   Review of Systems             All symptoms NEG except where BOLDED >>  Constitutional:  F/C/S, fatigue, anorexia, unexpected weight change. HEENT:  HA, visual changes, hearing loss, earache, nasal symptoms, sore throat, mouth sores, hoarseness. Resp:  cough, sputum, hemoptysis; SOB, tightness, wheezing. Cardio:  CP, palpit, DOE, orthopnea, edema. GI:  N/V/D/C, blood in stool; reflux, abd pain, distention, gas. GU:  dysuria, freq, urgency, hematuria, flank pain, voiding difficulty. MS:  joint pain, swelling, tenderness, decr ROM; neck pain, back pain, etc. Neuro:  HA, tremors, seizures, dizziness, syncope, weakness, numbness, gait abn. Skin:  suspicious lesions or skin rash. Heme:  adenopathy, bruising, bleeding. Psyche:  confusion, agitation, sleep disturbance, hallucinations, anxiety, depression suicidal.   Objective:   Physical Exam       Vital Signs:  Reviewed...  General:  WD, WN, 70 y/o WF in NAD; alert & oriented; pleasant & cooperative... HEENT:  [place]/AT; Conjunctiva- pink, Sclera- nonicteric, EOM-wnl, PERRLA, EACs-clear, TMs-wnl; NOSE-[place]; THROAT-clear & wnl. Neck:  Supple w/ fair ROM; no JVD; normal carotid impulses w/o bruits; no thyromegaly or nodules palpated; no  lymphadenopathy. Chest:  decr BS at bases w/ scat rhonchi; no wheezing, rales, or signs of consolidation... Heart:  Regular Rhythm; norm S1 & S2 without murmurs, rubs, or gallops detected. Abdomen:  Soft & nontender- no guarding or rebound; normal bowel sounds; no organomegaly or masses palpated. Ext:  Normal ROM; without deformities or arthritic changes; no varicose veins, venous insuffic, or edema;  Pulses intact w/o bruits. Neuro:  No focal neuro deficits, sensory testing normal; gait normal & balance OK. Derm:  No lesions noted; no rash etc. Lymph:  No cervical, supraclavicular, axillary, or inguinal adenopathy palpated.   Assessment:      IMP >>     COPD/ AB-- mod persistent, chronic bronchitic type, with a reversible component; FEV1 12/2015= 1.25 (54%); she quit smoking 1984 & has only a 5 pack-yr smoking hx; due to cost issues we have tried to optimize her therapy w/ min expense & rec NEBS w/ DUONEB Qid and BUDESONIDE 0.25Bid; she has a Programme researcher, broadcasting/film/video inhaler for prn use when out & about...    Hx allergic rhinitis-- treated by [name] [name], we do not have his notes.    Hx OSA-- sleep study and rx per [name] [name] in 2015, we do not have any of this data.    ENT-- per [name] [name] w/ facial pressure & nasal obstruction- eval w/ CT Sinus & flex endoscopy revealed bilat nasal polyps, deviated septum, bilat inferior turbinate hypertrophy, & a large right concha bullosa => s/p endoscopic sinus surg 01/2016 and she reports much improved & able to breathe thru her nose...     Medical issues-- per [name] [name] w/  HBP, HL, Hypothyroid, GERD, Hx kidney cancer- s/p left nephrectomy, osteopenia, Anxiety/Depression  PLAN >>  12/24/15>   Zionnah has done well on the [facility] one inhalation daily but the $300/mo is prohibitive; we discussed trial of NEBULIZED medications to see if they work as well and are less expensive; In this regard- start DUONEB BID via Nebulizer and BUDESONIDE 0.25 BID via Nebulizer; she will continue to use  the VentolinHFA as needed and her other meds as listed... 03/17/16>    Problems as noted, she is improved overall but the Bid NEB sched is not working long enough- she still prefers the NEBs over the inhalers due to cost; Rec to incr Duoneb to Qid, continue Budesonide Bid;  SHE NEEDS A NEW SMALLER PORTABLE NEB UNIT=> we will request thru her DME company;  She will try to get [place] sleep apnea data (sleep study date & results, machine info & download data;  We plan ROV in 30mo, sooner if needed prn...     Plan:     Patient's Medications  New Prescriptions   No medications on file  Previous Medications   ALPRAZOLAM (XANAX) 0.5 MG TABLET    Take 0.5-1 mg by mouth at bedtime as needed. Sleep.   AMLODIPINE (NORVASC) 5 MG TABLET    TAKE 1/2 TO 1 TABLET DAILY FOR BLOOD PRESSURE   BESIVANCE 0.6 % SUSP       BUDESONIDE (PULMICORT) 0.25 MG/2ML NEBULIZER SOLUTION    Take 2 mLs (0.25 mg total) by nebulization 2 (two) times daily.   CO-ENZYME Q-10 50 MG CAPSULE    Take 50 mg by mouth daily.   DORZOLAMIDE-TIMOLOL (COSOPT) 22.3-6.8 MG/ML OPHTHALMIC SOLUTION    Place 1 drop into the left eye 2 (two) times daily.   ESCITALOPRAM (LEXAPRO) 20 MG TABLET    Take 20 mg by mouth daily.   HYDRALAZINE (APRESOLINE) 25 MG TABLET    Take 25 mg by mouth 2 (two) times daily.    HYDROCODONE-ACETAMINOPHEN (NORCO/VICODIN) 5-325 MG TABLET    Take 1 tablet by mouth every 4 (four) hours as needed.   LEVOCETIRIZINE (XYZAL) 5 MG TABLET    Take 5 mg by mouth daily.   LEVOTHYROXINE (SYNTHROID, LEVOTHROID) 100 MCG TABLET    Take 100 mcg by mouth daily before breakfast.   PANTOPRAZOLE (PROTONIX) 40 MG TABLET    Take 40 mg by mouth daily.   PROBIOTIC PRODUCT (PROBIOTIC PO)    Take 1 tablet by mouth at bedtime.    VENTOLIN HFA 108 (90 BASE) MCG/ACT INHALER    Inhale 1-2 puffs into the lungs every 4 (four) hours as needed. Ever  4-6 hours prn cough or wheeze.  Modified Medications   Modified Medication Previous Medication    IPRATROPIUM-ALBUTEROL (DUONEB) 0.5-2.5 (3) MG/3ML SOLN ipratropium-albuterol (DUONEB) 0.5-2.5 (3) MG/3ML SOLN      Take 3 mLs by nebulization 4 (four) times daily.    Take 3 mLs by nebulization 2 (two) times daily.  Discontinued Medications   STOP the [place] for now while you are taking the DUONEB & BUDESONIDE via NEB machine

## 2016-03-17 NOTE — Telephone Encounter (Signed)
There was an order placed this AM for nebulizer machine. This order can't be processed by [facility] until SN electronically signs this.  SN - please sign this order. Thanks.

## 2016-03-17 NOTE — Patient Instructions (Signed)
Today we updated your med list in our EPIC system...    Continue your current medications the same...  We will contact your DME company regarding a new small portable nebulizer machine...  We wrote for DUONEB via the nebulizer 4 times daily #120 w/ refills...  Continue the Pulmicort (Budesonide) twice daily...  Call for any questions...  Let's plan a follow up visit in 56mo, sooner if needed for problems.Marland KitchenMarland Kitchen

## 2016-03-19 NOTE — Addendum Note (Signed)
Addended by: Len Blalock on: 03/19/2016 05:15 PM   Modules accepted: Orders

## 2016-03-20 DIAGNOSIS — H25012 Cortical age-related cataract, left eye: Secondary | ICD-10-CM | POA: Diagnosis not present

## 2016-03-20 DIAGNOSIS — H2512 Age-related nuclear cataract, left eye: Secondary | ICD-10-CM | POA: Diagnosis not present

## 2016-03-20 DIAGNOSIS — H401121 Primary open-angle glaucoma, left eye, mild stage: Secondary | ICD-10-CM | POA: Diagnosis not present

## 2016-03-20 DIAGNOSIS — H401123 Primary open-angle glaucoma, left eye, severe stage: Secondary | ICD-10-CM | POA: Diagnosis not present

## 2016-04-01 DIAGNOSIS — J320 Chronic maxillary sinusitis: Secondary | ICD-10-CM | POA: Diagnosis not present

## 2016-04-01 DIAGNOSIS — J322 Chronic ethmoidal sinusitis: Secondary | ICD-10-CM | POA: Diagnosis not present

## 2016-04-01 DIAGNOSIS — J338 Other polyp of sinus: Secondary | ICD-10-CM | POA: Diagnosis not present

## 2016-04-02 ENCOUNTER — Ambulatory Visit (INDEPENDENT_AMBULATORY_CARE_PROVIDER_SITE_OTHER): Payer: Medicare Other | Admitting: *Deleted

## 2016-04-02 DIAGNOSIS — M810 Age-related osteoporosis without current pathological fracture: Secondary | ICD-10-CM

## 2016-04-02 MED ORDER — DENOSUMAB 60 MG/ML ~~LOC~~ SOLN
60.0000 mg | Freq: Once | SUBCUTANEOUS | Status: AC
  Administered 2016-04-02: 60 mg via SUBCUTANEOUS

## 2016-04-03 NOTE — Telephone Encounter (Signed)
prolia given 04/02/16  Next injection after 10/03/16

## 2016-04-04 DIAGNOSIS — L301 Dyshidrosis [pompholyx]: Secondary | ICD-10-CM | POA: Diagnosis not present

## 2016-04-08 ENCOUNTER — Telehealth: Payer: Self-pay | Admitting: Pulmonary Disease

## 2016-04-08 NOTE — Telephone Encounter (Signed)
lmtcb x1 

## 2016-04-09 MED ORDER — ALBUTEROL SULFATE HFA 108 (90 BASE) MCG/ACT IN AERS: 1.0000 | INHALATION_SPRAY | RESPIRATORY_TRACT | Status: DC | PRN

## 2016-04-09 NOTE — Telephone Encounter (Signed)
Per last OV 03/17/16: Patient Instructions       Today we updated your med list in our EPIC system...    Continue your current medications the same... We will contact your DME company regarding a new small portable nebulizer machine... We wrote for DUONEB via the nebulizer 4 times daily #120 w/ refills... Continue the Pulmicort (Budesonide) twice daily... Call for any questions... Let's plan a follow up visit in 30mo, sooner if needed for problems...  -- Called spoke with pt. She reports she is using budesonide and duoneb BID. She reports she is still having increase SOB where she is having to do a neb tx in the middle of night. She reports the reason she has not done the duoneb QID bc she did not know she was suppose to. Pt wants to know if she can take anything stronger in the nebulizer. She is also wanting a rescue inhaler to have on hand. She is not on any inhalers at this time. Please advise SN thanks      Current Outpatient Prescriptions on File Prior to Visit  Medication Sig Dispense Refill  . ALPRAZolam (XANAX) 0.5 MG tablet Take 0.5-1 mg by mouth at bedtime as needed. Sleep.  0  . amLODipine (NORVASC) 5 MG tablet TAKE 1/2 TO 1 TABLET DAILY FOR BLOOD PRESSURE 30 tablet 4  . BESIVANCE 0.6 % SUSP     . budesonide (PULMICORT) 0.25 MG/2ML nebulizer solution Take 2 mLs (0.25 mg total) by nebulization 2 (two) times daily. 60 mL 12  . co-enzyme Q-10 50 MG capsule Take 50 mg by mouth daily.    . dorzolamide-timolol (COSOPT) 22.3-6.8 MG/ML ophthalmic solution Place 1 drop into the left eye 2 (two) times daily.  3  . escitalopram (LEXAPRO) 20 MG tablet Take 20 mg by mouth daily.  1  . fluticasone furoate-vilanterol (BREO ELLIPTA) 200-25 MCG/INH AEPB Inhale 1 puff into the lungs daily. 60 each 5  . hydrALAZINE (APRESOLINE) 25 MG tablet Take 25 mg by mouth 2 (two) times daily.     Marland Kitchen HYDROcodone-acetaminophen (NORCO/VICODIN) 5-325 MG tablet Take 1 tablet by mouth every 4 (four) hours as  needed. 9 tablet 0  . INCRUSE ELLIPTA 62.5 MCG/INH AEPB Inhale 1 puff into the lungs daily. 30 each 5  . ipratropium-albuterol (DUONEB) 0.5-2.5 (3) MG/3ML SOLN Take 3 mLs by nebulization 4 (four) times daily. 120 mL 11  . levocetirizine (XYZAL) 5 MG tablet Take 5 mg by mouth daily.    Marland Kitchen levothyroxine (SYNTHROID, LEVOTHROID) 100 MCG tablet Take 100 mcg by mouth daily before breakfast.    . pantoprazole (PROTONIX) 40 MG tablet Take 40 mg by mouth daily.    . Probiotic Product (PROBIOTIC PO) Take 1 tablet by mouth at bedtime.     . VENTOLIN HFA 108 (90 Base) MCG/ACT inhaler Inhale 1-2 puffs into the lungs every 4 (four) hours as needed. Ever  4-6 hours prn cough or wheeze.  0   No current facility-administered medications on file prior to visit.

## 2016-04-09 NOTE — Telephone Encounter (Signed)
Per SN: Okay to send in proair refill 1-2 puffs q4hrs prn for rescue. She needs to do the duoneb QID. If she wants to change the nighttime dose of the duoneb then we can change this to perforomist. 1 vial qhs.  Called spoke with pt. She wants to start using the duoneb QID first before we change the night time dose,. RX for proair sent in. Nothing further needed

## 2016-04-10 DIAGNOSIS — J209 Acute bronchitis, unspecified: Secondary | ICD-10-CM | POA: Diagnosis not present

## 2016-04-10 DIAGNOSIS — E039 Hypothyroidism, unspecified: Secondary | ICD-10-CM | POA: Diagnosis not present

## 2016-04-10 DIAGNOSIS — E559 Vitamin D deficiency, unspecified: Secondary | ICD-10-CM | POA: Diagnosis not present

## 2016-04-10 DIAGNOSIS — I10 Essential (primary) hypertension: Secondary | ICD-10-CM | POA: Diagnosis not present

## 2016-04-10 DIAGNOSIS — Z79899 Other long term (current) drug therapy: Secondary | ICD-10-CM | POA: Diagnosis not present

## 2016-04-26 DIAGNOSIS — I10 Essential (primary) hypertension: Secondary | ICD-10-CM | POA: Diagnosis not present

## 2016-04-26 DIAGNOSIS — J209 Acute bronchitis, unspecified: Secondary | ICD-10-CM | POA: Diagnosis not present

## 2016-04-26 DIAGNOSIS — E039 Hypothyroidism, unspecified: Secondary | ICD-10-CM | POA: Diagnosis not present

## 2016-04-26 DIAGNOSIS — J449 Chronic obstructive pulmonary disease, unspecified: Secondary | ICD-10-CM | POA: Diagnosis not present

## 2016-04-28 ENCOUNTER — Ambulatory Visit: Payer: Medicare Other | Admitting: Internal Medicine

## 2016-05-08 IMAGING — CT CT MAXILLOFACIAL W/O CM
3 series · 16 of 40 positions shown, 19 images · non-contrast
Comparison: Brain MRI 01/03/2013

CLINICAL DATA: 69-year-old female with chronic sinusitis currently
on antibiotics. Initial encounter.

EXAM:
CT MAXILLOFACIAL WITHOUT CONTRAST
TECHNIQUE: Multidetector CT imaging of the maxillofacial structures was
performed. Multiplanar CT image reconstructions were also generated.
A small metallic BB was placed on the right temple in order to
reliably differentiate right from left.

[Series 3: axial soft 1.25 · axial · 0.49mm/px · z∈[-78,-55]mm · 2 of 148 slices shown]
[im 10/148  brain]
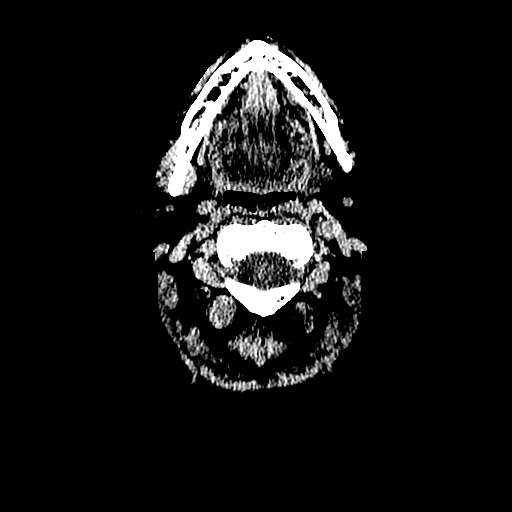
[im 28/148  brain]
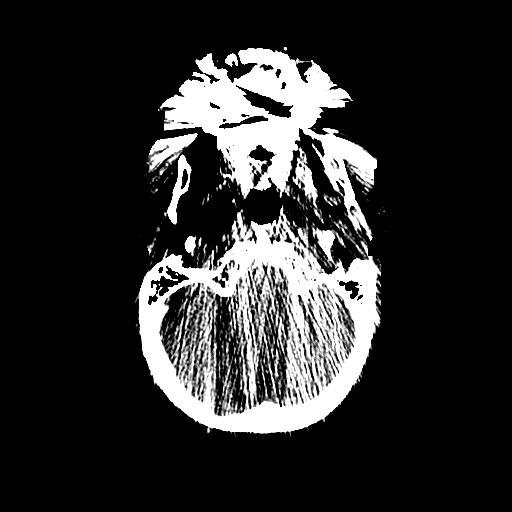

[Series 601: coronal facial · coronal · 0.49mm/px · 3 of 118 slices shown]
[im 40/118  bone]
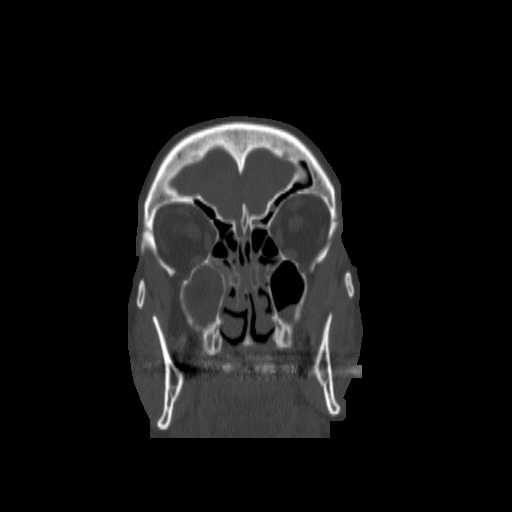
[im 53/118  bone]
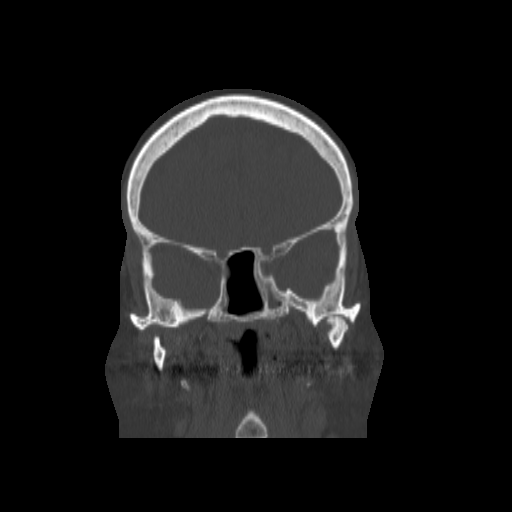
[im 66/118  bone]
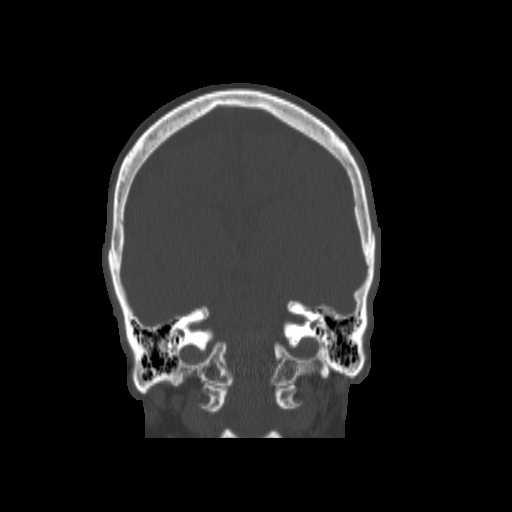

[Series 602: sagittal facial · sagittal · 0.49mm/px · 11 of 118 slices shown, 14 images]
[im 10/118  brain]
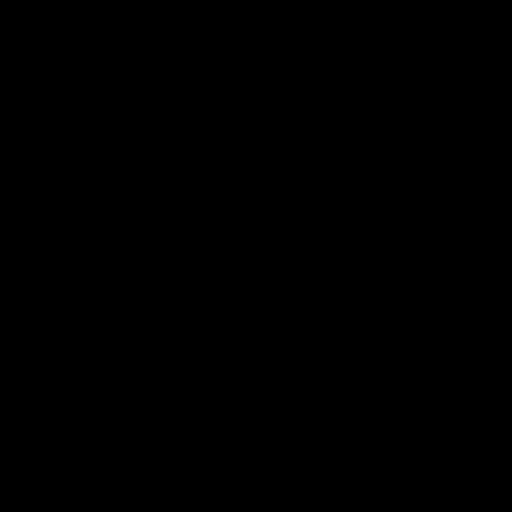
[im 10/118  bone]
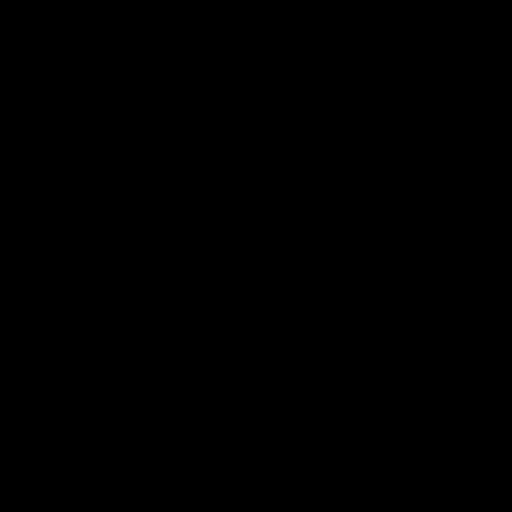
[im 20/118  bone]
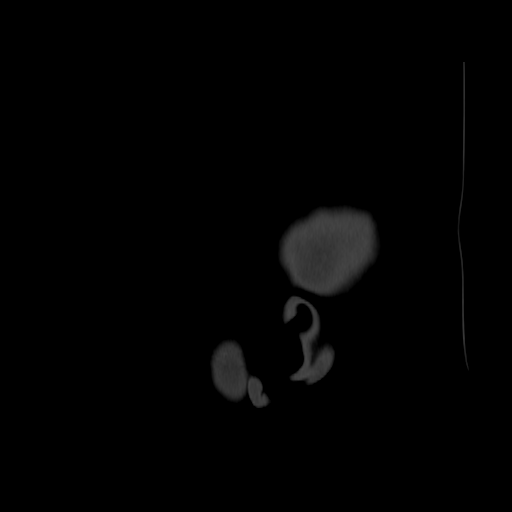
[im 30/118  bone]
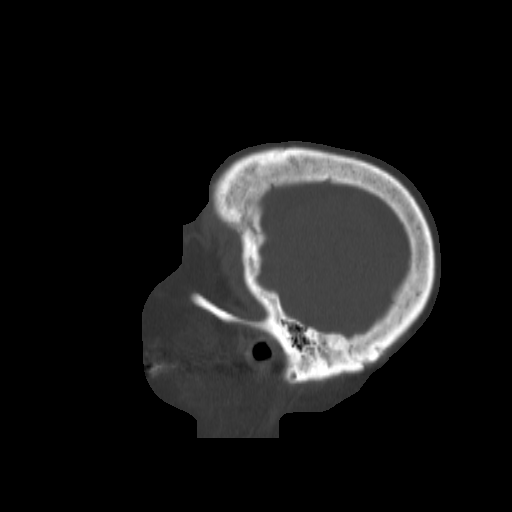
[im 40/118  bone]
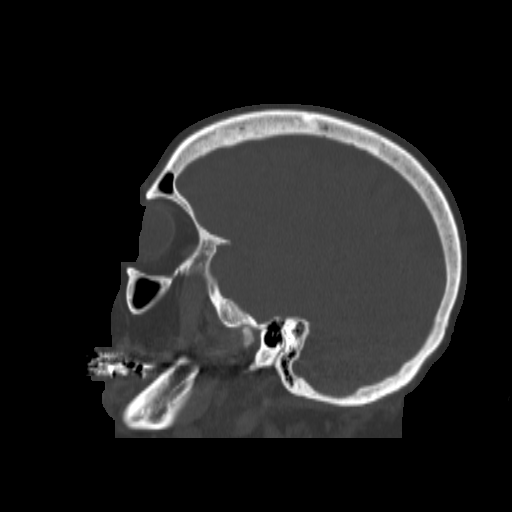
[im 49/118  brain]
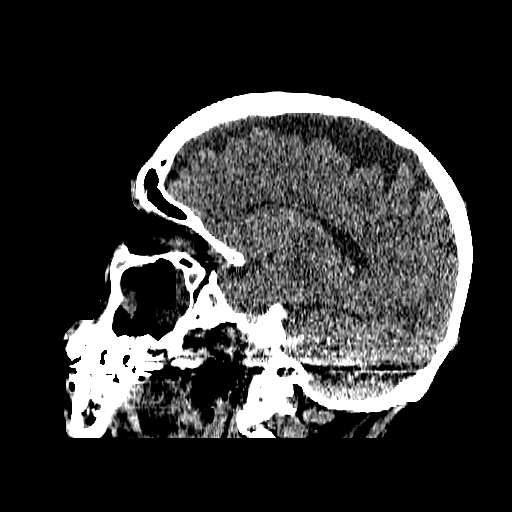
[im 49/118  bone]
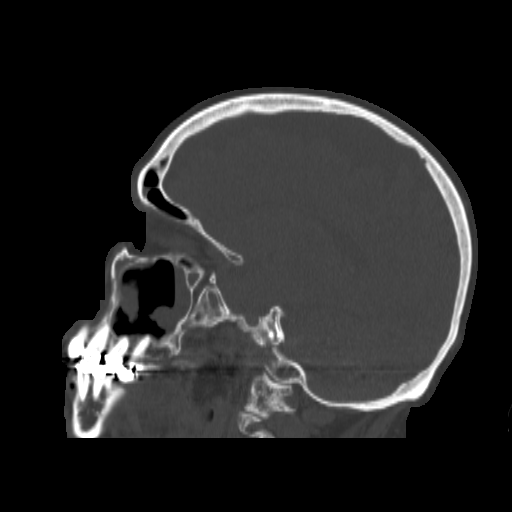
[im 59/118  bone]
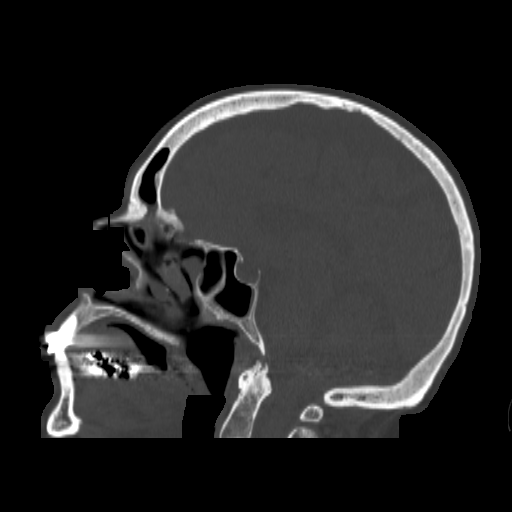
[im 69/118  bone]
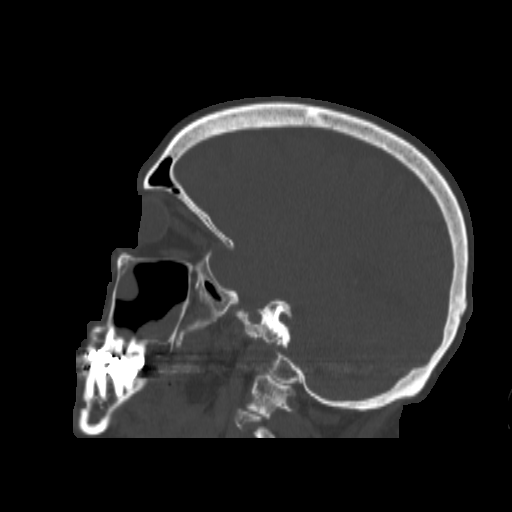
[im 79/118  bone]
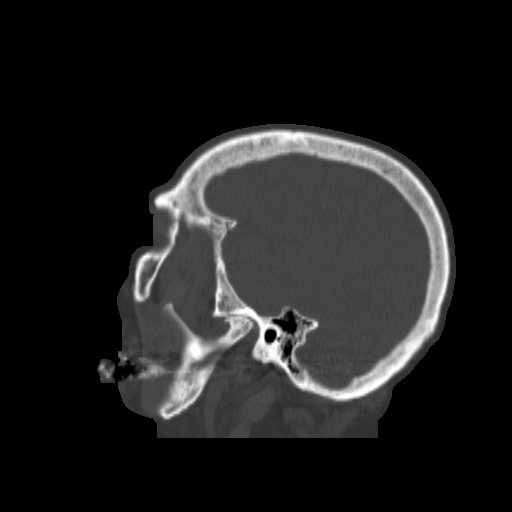
[im 88/118  brain]
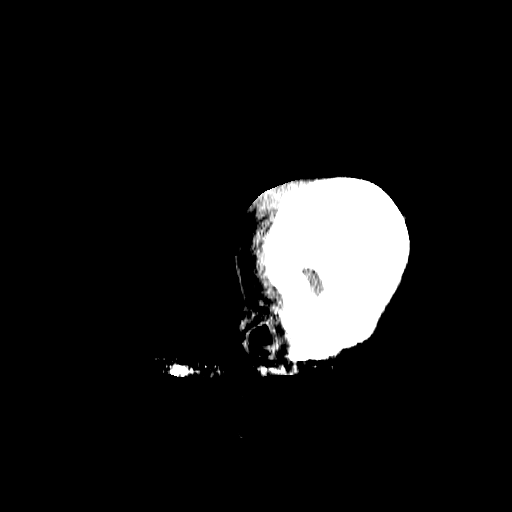
[im 88/118  bone]
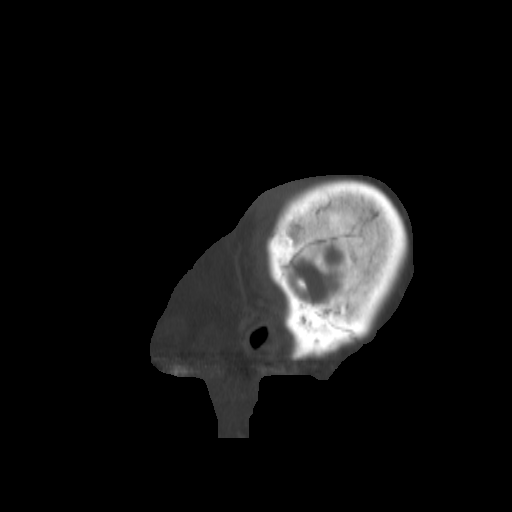
[im 98/118  bone]
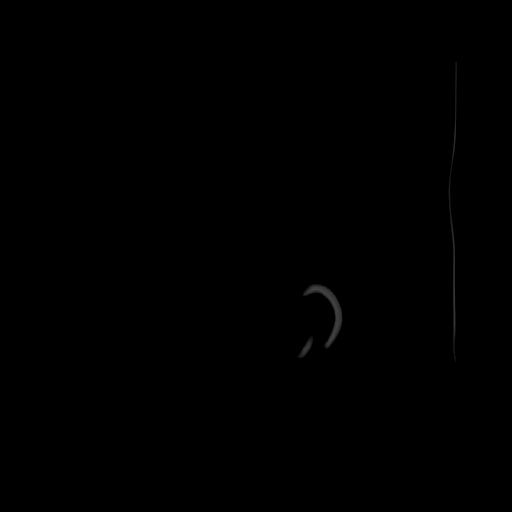
[im 108/118  bone]
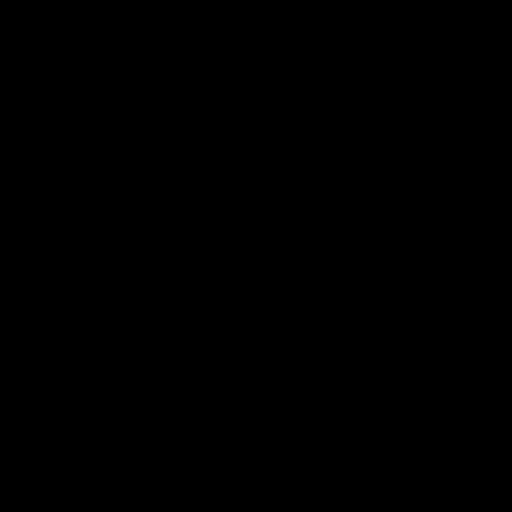

[16 of 40 positions shown; findings below may reference images not displayed]

FINDINGS: Bilateral tympanic cavities and mastoids are clear. Visualized
noncontrast brain parenchyma appears stable. Mild Calcified
atherosclerosis at the skull base. Visualized orbits and scalp soft
tissues are within normal limits. Negative visualized noncontrast
deep soft tissue spaces of the face.

Small fluid levels in the sphenoid sinuses, right maxillary sinus.
Superimposed bubbly opacity in the left maxillary sinus. Multifocal
polypoid opacity in the maxillary sinuses, nasal cavity, and ethmoid
air cells. Minimal mucosal thickening in the sphenoid sinuses. Mild
to moderate mucosal thickening and opacification of the
frontoethmoidal recesses, otherwise both frontal sinuses are
relatively spared.

Both 0 M sees are partially or mostly opacified (coronal image 33).
Leftward nasal septal deviation. Right middle concha bullosa, mostly
opacified. No acute osseous abnormality identified.
IMPRESSION: Paranasal sinus inflammatory changes relatively sparing the frontal
sinuses. Small fluid levels compatible with acute sinusitis.
Polypoid opacity in the maxillary sinuses, and to a lesser extent
the ethmoids and nasal cavity, raises the possibility of sinonasal
polyposis.

## 2016-05-26 DIAGNOSIS — R0689 Other abnormalities of breathing: Secondary | ICD-10-CM | POA: Diagnosis not present

## 2016-05-26 DIAGNOSIS — J441 Chronic obstructive pulmonary disease with (acute) exacerbation: Secondary | ICD-10-CM | POA: Diagnosis not present

## 2016-05-26 DIAGNOSIS — R0602 Shortness of breath: Secondary | ICD-10-CM | POA: Diagnosis not present

## 2016-05-26 DIAGNOSIS — J029 Acute pharyngitis, unspecified: Secondary | ICD-10-CM | POA: Diagnosis not present

## 2016-05-28 DIAGNOSIS — J330 Polyp of nasal cavity: Secondary | ICD-10-CM | POA: Diagnosis not present

## 2016-05-28 DIAGNOSIS — J320 Chronic maxillary sinusitis: Secondary | ICD-10-CM | POA: Diagnosis not present

## 2016-05-28 DIAGNOSIS — J322 Chronic ethmoidal sinusitis: Secondary | ICD-10-CM | POA: Diagnosis not present

## 2016-05-28 DIAGNOSIS — H9012 Conductive hearing loss, unilateral, left ear, with unrestricted hearing on the contralateral side: Secondary | ICD-10-CM | POA: Diagnosis not present

## 2016-06-06 DIAGNOSIS — K589 Irritable bowel syndrome without diarrhea: Secondary | ICD-10-CM | POA: Diagnosis not present

## 2016-06-06 DIAGNOSIS — E039 Hypothyroidism, unspecified: Secondary | ICD-10-CM | POA: Diagnosis not present

## 2016-06-06 DIAGNOSIS — E0590 Thyrotoxicosis, unspecified without thyrotoxic crisis or storm: Secondary | ICD-10-CM | POA: Diagnosis not present

## 2016-06-06 DIAGNOSIS — E559 Vitamin D deficiency, unspecified: Secondary | ICD-10-CM | POA: Diagnosis not present

## 2016-06-06 DIAGNOSIS — Z79899 Other long term (current) drug therapy: Secondary | ICD-10-CM | POA: Diagnosis not present

## 2016-06-06 DIAGNOSIS — G8929 Other chronic pain: Secondary | ICD-10-CM | POA: Diagnosis not present

## 2016-06-06 DIAGNOSIS — F329 Major depressive disorder, single episode, unspecified: Secondary | ICD-10-CM | POA: Diagnosis not present

## 2016-06-06 DIAGNOSIS — M545 Low back pain: Secondary | ICD-10-CM | POA: Diagnosis not present

## 2016-06-06 DIAGNOSIS — F411 Generalized anxiety disorder: Secondary | ICD-10-CM | POA: Diagnosis not present

## 2016-06-06 DIAGNOSIS — M5442 Lumbago with sciatica, left side: Secondary | ICD-10-CM | POA: Diagnosis not present

## 2016-07-01 DIAGNOSIS — F411 Generalized anxiety disorder: Secondary | ICD-10-CM | POA: Diagnosis not present

## 2016-07-01 DIAGNOSIS — R0602 Shortness of breath: Secondary | ICD-10-CM | POA: Diagnosis not present

## 2016-07-01 DIAGNOSIS — J209 Acute bronchitis, unspecified: Secondary | ICD-10-CM | POA: Diagnosis not present

## 2016-07-01 DIAGNOSIS — E0590 Thyrotoxicosis, unspecified without thyrotoxic crisis or storm: Secondary | ICD-10-CM | POA: Diagnosis not present

## 2016-07-23 DIAGNOSIS — R0602 Shortness of breath: Secondary | ICD-10-CM | POA: Diagnosis not present

## 2016-07-23 DIAGNOSIS — J209 Acute bronchitis, unspecified: Secondary | ICD-10-CM | POA: Diagnosis not present

## 2016-07-23 DIAGNOSIS — R05 Cough: Secondary | ICD-10-CM | POA: Diagnosis not present

## 2016-07-31 DIAGNOSIS — L820 Inflamed seborrheic keratosis: Secondary | ICD-10-CM | POA: Diagnosis not present

## 2016-07-31 DIAGNOSIS — Z85820 Personal history of malignant melanoma of skin: Secondary | ICD-10-CM | POA: Diagnosis not present

## 2016-07-31 DIAGNOSIS — Z1283 Encounter for screening for malignant neoplasm of skin: Secondary | ICD-10-CM | POA: Diagnosis not present

## 2016-07-31 DIAGNOSIS — Z08 Encounter for follow-up examination after completed treatment for malignant neoplasm: Secondary | ICD-10-CM | POA: Diagnosis not present

## 2016-08-04 DIAGNOSIS — E559 Vitamin D deficiency, unspecified: Secondary | ICD-10-CM | POA: Diagnosis not present

## 2016-08-04 DIAGNOSIS — I10 Essential (primary) hypertension: Secondary | ICD-10-CM | POA: Diagnosis not present

## 2016-08-04 DIAGNOSIS — E7800 Pure hypercholesterolemia, unspecified: Secondary | ICD-10-CM | POA: Diagnosis not present

## 2016-08-04 DIAGNOSIS — Z79899 Other long term (current) drug therapy: Secondary | ICD-10-CM | POA: Diagnosis not present

## 2016-08-04 DIAGNOSIS — E039 Hypothyroidism, unspecified: Secondary | ICD-10-CM | POA: Diagnosis not present

## 2016-08-04 DIAGNOSIS — J441 Chronic obstructive pulmonary disease with (acute) exacerbation: Secondary | ICD-10-CM | POA: Diagnosis not present

## 2016-08-18 ENCOUNTER — Telehealth: Payer: Self-pay | Admitting: Gynecology

## 2016-08-18 NOTE — Telephone Encounter (Signed)
Phone call from pt ? Date of next injection Prolia due after 10/03/16

## 2016-08-19 ENCOUNTER — Other Ambulatory Visit: Payer: Self-pay | Admitting: Family Medicine

## 2016-08-19 DIAGNOSIS — Z1231 Encounter for screening mammogram for malignant neoplasm of breast: Secondary | ICD-10-CM

## 2016-09-02 NOTE — Telephone Encounter (Signed)
Prolia to be given the same day as complete exam with [place]. To be scheduled after 10/03/16 due to Prolia injection. Patient will bring in her Calcium level from her PCP. Must have Calcium level prior to injection.  No changes with insurance from 10/2015

## 2016-09-03 NOTE — Telephone Encounter (Signed)
[name] [name] coming in 10/09/16 r/s her appointment with [place]. Must have complete exam prior to Prolia . I will let N [place] know also.

## 2016-09-03 NOTE — Telephone Encounter (Signed)
Okay we will do her exam and give her the Prolia after her annual exam. Her last annual exam here was 08/2015.

## 2016-09-16 ENCOUNTER — Other Ambulatory Visit (INDEPENDENT_AMBULATORY_CARE_PROVIDER_SITE_OTHER): Payer: Medicare Other

## 2016-09-16 ENCOUNTER — Encounter: Payer: Self-pay | Admitting: Pulmonary Disease

## 2016-09-16 ENCOUNTER — Ambulatory Visit (INDEPENDENT_AMBULATORY_CARE_PROVIDER_SITE_OTHER): Payer: Medicare Other | Admitting: Pulmonary Disease

## 2016-09-16 VITALS — BP 136/88 | HR 97 | Temp 98.5°F | Ht 66.0 in | Wt 158.0 lb

## 2016-09-16 DIAGNOSIS — J339 Nasal polyp, unspecified: Secondary | ICD-10-CM | POA: Diagnosis not present

## 2016-09-16 DIAGNOSIS — J301 Allergic rhinitis due to pollen: Secondary | ICD-10-CM

## 2016-09-16 DIAGNOSIS — J449 Chronic obstructive pulmonary disease, unspecified: Secondary | ICD-10-CM

## 2016-09-16 LAB — CBC WITH DIFFERENTIAL/PLATELET
BASOS PCT: 0.6 % (ref 0.0–3.0)
Basophils Absolute: 0.1 10*3/uL (ref 0.0–0.1)
Eosinophils Absolute: 1.5 10*3/uL — ABNORMAL HIGH (ref 0.0–0.7)
HEMATOCRIT: 43 % (ref 36.0–46.0)
HEMOGLOBIN: 14.8 g/dL (ref 12.0–15.0)
LYMPHS PCT: 27.9 % (ref 12.0–46.0)
Lymphs Abs: 2.5 10*3/uL (ref 0.7–4.0)
MCHC: 34.3 g/dL (ref 30.0–36.0)
MCV: 90.1 fl (ref 78.0–100.0)
MONOS PCT: 6.8 % (ref 3.0–12.0)
Monocytes Absolute: 0.6 10*3/uL (ref 0.1–1.0)
Neutro Abs: 4.4 10*3/uL (ref 1.4–7.7)
Neutrophils Relative %: 48.1 % (ref 43.0–77.0)
Platelets: 252 10*3/uL (ref 150.0–400.0)
RBC: 4.77 Mil/uL (ref 3.87–5.11)
RDW: 12.9 % (ref 11.5–15.5)
WBC: 9.1 10*3/uL (ref 4.0–10.5)

## 2016-09-16 MED ORDER — METHYLPREDNISOLONE ACETATE 80 MG/ML IJ SUSP
80.0000 mg | Freq: Once | INTRAMUSCULAR | Status: AC
  Administered 2016-09-16: 80 mg via INTRAMUSCULAR

## 2016-09-16 MED ORDER — LEVOCETIRIZINE DIHYDROCHLORIDE 5 MG PO TABS: 5.0000 mg | ORAL_TABLET | Freq: Every day | ORAL | 5 refills | Status: DC

## 2016-09-16 MED ORDER — INCRUSE ELLIPTA 62.5 MCG/INH IN AEPB: 1.0000 | INHALATION_SPRAY | Freq: Every day | RESPIRATORY_TRACT | 5 refills | Status: DC

## 2016-09-16 MED ORDER — METHYLPREDNISOLONE 4 MG PO TBPK: ORAL_TABLET | ORAL | 0 refills | Status: DC

## 2016-09-16 MED ORDER — FLUTICASONE FUROATE-VILANTEROL 200-25 MCG/INH IN AEPB: 1.0000 | INHALATION_SPRAY | Freq: Every day | RESPIRATORY_TRACT | 5 refills | Status: DC

## 2016-09-16 NOTE — Progress Notes (Signed)
Subjective:     Patient ID: [name] [name], female   DOB: 1946-06-06, 70 y.o.   MRN: [id number]  HPI    70 y/o WF, ex-smoker w/ a hx of asthma & GOLD Stage 2 COPD, referred by [name] [name] for a pre-op respiratory evaluation...   ~  December 24, 2015:  Initial pulmonary consult by [name] [name]>      Her PCP is Dr. Sandi [name] at [facility])-- followed for AR/ Asthma/ COPD, HBP, HL, Hypothyroid, GERD, Hx kidney cancer- s/p left nephrectomy, Anxiety/Depression...    70 y/o WF referred by [name] [name], ENT for pre-op pulmonary clearance for planned ENT surg (see below)>     [name] [name] has a min smoking hx & quit in 1984; she was prev treated by [name] [name] on [id number], then moved to [place], then back to [place] and her new PCP [name] [name] gave her Memory Dance & she really likes this med but they are all >$300 on her insurance; she notes min cough, small amt clear sput, DOE w/ exertion but does ok w/ usual activities and ADLs; she had a bronchitic exac 10/2015 w/ some discolored sput & streaky hemoptysis & was seen in the ER-- Symbicort & Ventolin were listed as her meds, Chest was clear on exam, CXR was clear/NAD and LABS were also wnl including D-dimer; Given NEB rx and antibiotic & improved; she noted some SOB but mostly because she couldn't breathe thru her nose=> referred to [name] [name], ENT...    She saw [name] [name] yrs ago- last 2009 & avail records indicate hx Asthma w/ COPD & mult exac treated w/ Pred etc; she had DOE & faint wheezing on exam; Hx GERD treated by [name] [name]    She saw [name] [name] w/ facial pressure & nasal obstruction- eval w/ CT Sinus & flex endoscopy revealed bilat nasal polyps, deviated septum, bilat inferior turbinate hypertrophy, & a large right concha bullosa;  He plans surg for all these findings and requests pre-op pulmonary eval...     Note: there is a mention of "mild to mod OSA" diagnosed on a sleep study in 2015 & a letter from [facility] for an oral sleep appliance; none of the original data is avail to [place] &  the pt never received any treatment for OSA...  Smoking Hx>  Ex-smoker, smoked for ~10 yrs, up to 1/2 PPD, quit smoking in 1984 for ~5pack-yr smoking hx...  Pulmonary Hx>  She sees [name] [name] for Allergy (we do not have his records); told to have asthma and COPD in the past; said to have OSA as well (we do not have any of this data).  Medical Hx>  HBP, HL, Hypothyroid, GERD, Hx kidney cancer- s/p left nephrectomy, Anxiety/Depression  Family Hx>  Father had lung cancer, Mother had asthma; her [place] were [place].  Occup Hx>  No known exposure to asbestos or any toxins.  Current Meds>  Xyzal5, VentolinHFA, Breo, Liberal, Apres25, Protonix40, Synthroid100, Prolia, Alpraz0.5, Lexapro20  EXAM shows Afeb, VSS, O2sat=96% onRA;  HEENT- nasal obstruction/ congestion, mallampati3;  Chest- few bibasilar rhonchi w/o w/r/consolidation;  Heart- RR w/o m/r/g;  Abd- soft, nontender, neg;  Ext- w/o c/c/e;  Neuro- intact...  Last CXR in Epic was 11/08/15>  Norm heart size, clear sl hyperexpanded lungs, mild scoliosis, NAD...  Spirometry 12/24/15>  FVC=2.40 (79%), FEV1=1.25 (54%), %1sec=52, mid-flows reduced at 32% predicted... c/w moderate airflow obstruction and GOLD Stage 2 COPD...  Ambulatory oximetry 12/24/15>  O2sat=100% on RA at rest w/ pulse=69/min;  She ambulated 3  Laps in the office w/ lowest O2say=98% w/ pulse=97/min... No desaturations...  IMP >>     1) COPD-- mod persistent, chronic bronchitic type, with a reversible component     2) Hx allergic rhinitis-- treated by [name] [name], we do not have his notes    3) Hx OSA-- sleep study and rx per [name] [name] in 2015, we do not have any of this data    4) ENT-- per [name] [name] w/ facial pressure & nasal obstruction- eval w/ CT Sinus & flex endoscopy revealed bilat nasal polyps, deviated septum, bilat inferior turbinate hypertrophy, & a large right concha bullosa    5) Medical issues-- per [name] [name] w/ HBP, HL, Hypothyroid, GERD, Hx kidney cancer-  s/p left nephrectomy, osteopenia, Anxiety/Depression PLAN >>     [name] [name] has done well on the Breo one inhalation daily but the $300/mo is prohibitive; we discussed trial of NEBULIZED medications to see if they work as well and are less expensive; In this regard- start DUONEB BID via Nebulizer and BUDESONIDE 0.25 BID via Nebulizer; she will continue to use the VentolinHFA as needed and her other meds as listed... we plan an ROV recheck in 33mo sooner if needed for acute problems, she is OK for surg...  ~  March 17, 2016:  310moOV w/ [name] [name]>  FrManus Gunningeports that she had her sinus surg by [name] [name] (polyps removed, inferior nasal turbinates reduced, and dev septum repaired) and she is able to breathe thru her nose much better & as a result she feels that her breathing overall is much improved;  When she was here 3/13- we tried to improve her meds/ costs by changing her Breo/ Incruse to NEBS w/ Duoneb Bid & Budes Bid, but she notes that this wasn't holding her as well (prob due to the shorter acting B-agonist & anticholinergic in Duoneb);  I have rec to pt that she can increase the Duoneb med to Qid if needed, and continue the Budesonide Bid- she prefers this to Breo/ Incruse to to the cost of the latter inhalers... We reviewed the following medical problems during today's office visit >>     COPD/ AB-- mod persistent, chronic bronchitic type, with a reversible component; FEV1 12/2015= 1.25 (54%); she quit smoking 1984 & has only a 5 pack-yr smoking hx; due to cost issues we have tried to optimize her therapy w/ min expense & rec NEBS w/ DUONEB Qid and BUDESONIDE 0.25Bid; she has a PrProgramme researcher, broadcasting/film/videonhaler for prn use when out & about...    Hx allergic rhinitis-- treated by [name] [name], we do not have his notes.    Hx OSA-- sleep study and rx per [name] [name] in 2015, we do not have any of this data.    ENT-- per [name] [name] w/ facial pressure & nasal obstruction- eval w/ CT Sinus & flex endoscopy revealed bilat nasal polyps, deviated  septum, bilat inferior turbinate hypertrophy, & a large right concha bullosa => s/p endoscopic sinus surg 01/2016 and she reports much improved & able to breathe thru her nose...     Medical issues-- per [name] [name] w/ HBP, HL, Hypothyroid, GERD, Hx kidney cancer- s/p left nephrectomy, osteopenia, Anxiety/Depression EXAM shows Afeb, VSS, O2sat=97% onRA;  HEENT- imroved nasal airway s/p surg, mallampati3;  Chest- clear w/o w/r/r;  Heart- RR w/o m/r/g;  Abd- soft, nontender, neg;  Ext- w/o c/c/e;  Neuro- intact... IMP/PLAN>>  Problems as noted, she is improved overall but the Bid NEB sched is not working long enough- she still prefers the NEBs over  the inhalers due to cost; Rec to incr Duoneb to Qid, continue Budesonide Bid;  SHE NEEDS A NEW SMALLER PORTABLE NEB UNIT=> we will request thru her DME company;  She will try to get [place] sleep apnea data (sleep study date & results, machine info & download data;  We plan ROV in 48mo sooner if needed prn...  ~  September 16, 2016:  629moOV & Fran reports that she has had mult flair-ups in her AB, frequently requiring antibiotics and Pred from her PCP, [name] [name];  She tells me that she still feels that the NEBULIZER meds (Duoneb + Budes) makes her worse (ie- breathing worse but she can't elaborate for me what she means) and that she is better when she backs off the neb rx;  She notes that she likes the BREO + INCRUSE better & says she does fine on these meds but they are too $$ in the donut hole;  Her CC today is recurrent nasal congestion & thick green nasal drainage- this has drained to her throat as well producing increased cough & mild wheezing... We reviewed the following medical problems during today's office visit >>     Asthma/ AB/ & COPD based on min smoking hx & what sounds like long-term poor airway control> mod persistent, chronic bronchitic type, with a reversible component; FEV1 12/2015= 1.25 (54%); she quit smoking 1984 & has only a 5 pack-yr smoking hx; due to cost  issues we have tried to optimize her therapy w/ min expense & tried NEBS w/ DUONEB Qid and BUDESONIDE 0.25Bid but she claims the nebulizer makes her breathing worse; she feels that she does well on BREO + INCRUSE but cost is an issue in the donut hole, where she uses her rescue inhale too often; +FamHx- mother has Asthma on Xolair, Father died from lung cancer... We discussed further eval w/ IgE/ RAST/ CBC w/ diff to check eos & she if she is a candidate for a biological...    Hx allergic rhinitis> eval by [name] [name], we do not have his notes, but pt reports that allergy testing was NEG, despite hx nasal polyposis etc...    Hx OSA> sleep study and rx per [name] [name] in 2015, we do not have any of this data avail to review...    ENT> per [name] [name] w/ facial pressure & nasal obstruction- eval w/ CT Sinus & flex endoscopy revealed bilat nasal polyps, deviated septum, bilat inferior turbinate hypertrophy, & a large right concha bullosa => s/p endoscopic sinus surg 01/2016 and she reports much improved & able to breathe thru her nose for awhile until her congestion returned; Rec to resume her nasal irrigations.....Marland KitchenMarland KitchenMarland Kitchen   Medical issues> per [name] [name] w/ HBP, HL, Hypothyroid, GERD, Hx kidney cancer- s/p left nephrectomy, prob secondary hyperpara, osteoporosis (DrFernandez), Anxiety/Depression... EXAM shows Afeb, VSS, O2sat=95% onRA;  HEENT- recurrent nasal congestion, mallampati3;  Chest- mild end exp wheezing L>R w/o rales/ rhonchi/ consolidation;  Heart- RR w/o m/r/g;  Abd- soft, nontender, neg;  Ext- w/o c/c/e...   LABS 09/16/16>  IgE=83 (nl<115);  RAST= all NEG;  CBCw/diff shows Hg=14.8, WBC=9.1 w/ 17% eos... IMP/PLAN>>  We discussed her dilemma and decided on RX w/ BREO- one inhalation daily, INCRUSE- one inhalation daily, ProairHFA rescue inhaler vs NEB w/ Duoneb as needed;  She will also start Rx w/ her nasal saline irrigations (per [name] [name]), and MUCINEX '600mg'$  Qid w/ fluids;  For her acute prob we will Rx w/ Depo80 +  MEDROL Dosepak (she doesn't like  Pred);  Finally we will pursue further eval w/ Ige/RAST testing & check a CBC w/ diff (off Pred x several months now) to check EOS & consider a biological if indicated... we plan ROV recheck in 6-8wks.  NOTE> IgE/RAST are NEG but CBC w/ 17% eos, she would be a candidate for a biological eg- Nucala, C[place]air, Fasenra & we will discuss on ret in Jan...    Past Medical History:  Diagnosis Date  . Allergy   . Anxiety   . Asthma   . Cancer ([place])    Kidney cancer-Hypernephroma  . Cervical dysplasia   . COPD (chronic obstructive pulmonary disease) ([place])   . Depression   . Elevated cholesterol   . GERD (gastroesophageal reflux disease)   . Glaucoma   . Hypertension   . Lymphocytic colitis   . Osteoporosis 08/2015   T score -3.2  . Serum calcium elevated     Past Surgical History:  Procedure Laterality Date  . ABDOMINAL HYSTERECTOMY  1989   TAH LSO  . AUGMENTATION MAMMAPLASTY     Implants removed  . COLPOSCOPY    . COSMETIC SURGERY    . KIDNEY REMOVED  2002   Left  . OOPHORECTOMY     LSO  . TONSILLECTOMY      Outpatient Encounter Prescriptions as of 09/16/2016  Medication Sig  . albuterol (PROAIR HFA) 108 (90 Base) MCG/ACT inhaler Inhale 1-2 puffs into the lungs every 4 (four) hours as needed for wheezing or shortness of breath.  . ALPRAZolam (XANAX) 0.5 MG tablet Take 0.5-1 mg by mouth at bedtime as needed. Sleep.  Marland Kitchen amLODipine (NORVASC) 5 MG tablet TAKE 1/2 TO 1 TABLET DAILY FOR BLOOD PRESSURE  . brimonidine (ALPHAGAN P) 0.1 % SOLN Place 1 drop into both eyes 2 (two) times daily.  Marland Kitchen co-enzyme Q-10 50 MG capsule Take 50 mg by mouth daily.  Marland Kitchen escitalopram (LEXAPRO) 20 MG tablet Take 20 mg by mouth daily.  . fluticasone furoate-vilanterol (BREO ELLIPTA) 200-25 MCG/INH AEPB Inhale 1 puff into the lungs daily.  . INCRUSE ELLIPTA 62.5 MCG/INH AEPB Inhale 1 puff into the lungs daily.  Marland Kitchen ipratropium-albuterol (DUONEB) 0.5-2.5 (3) MG/3ML SOLN Take 3  mLs by nebulization 4 (four) times daily.  Marland Kitchen levocetirizine (XYZAL) 5 MG tablet Take 1 tablet (5 mg total) by mouth daily.  Marland Kitchen levothyroxine (SYNTHROID, LEVOTHROID) 100 MCG tablet Take 100 mcg by mouth daily before breakfast.  . pantoprazole (PROTONIX) 40 MG tablet Take 40 mg by mouth daily.  . Probiotic Product (PROBIOTIC PO) Take 1 tablet by mouth at bedtime.   . budesonide (PULMICORT) 0.25 MG/2ML nebulizer solution Take 2 mLs (0.25 mg total) by nebulization 2 (two) times daily. (Patient not taking: Reported on 09/16/2016)  . [DISCONTINUED] BESIVANCE 0.6 % SUSP   . [DISCONTINUED] dorzolamide-timolol (COSOPT) 22.3-6.8 MG/ML ophthalmic solution Place 1 drop into the left eye 2 (two) times daily.  . [DISCONTINUED] hydrALAZINE (APRESOLINE) 25 MG tablet Take 25 mg by mouth 2 (two) times daily.   . [DISCONTINUED] HYDROcodone-acetaminophen (NORCO/VICODIN) 5-325 MG tablet Take 1 tablet by mouth every 4 (four) hours as needed. (Patient not taking: Reported on 09/16/2016)    Allergies  Allergen Reactions  . Iodine     Pt has only one kidney.  . Iohexol      Code: RASH, Desc: PT STATES SHE HAS ONE KIDNEY SO NOT TO USE IV DYE/10/27/06/RM, Onset Date: [id number]   . Penicillins     Has patient had a PCN reaction causing  immediate rash, facial/tongue/throat swelling, SOB or lightheadedness with hypotension: (No) Has patient had a PCN reaction causing severe rash involving mucus membranes or skin necrosis: (No) Has patient had a PCN reaction that required hospitalization (No) Has patient had a PCN reaction occurring within the last 10 years: (No) Pt states she has historically tolerated PCN, but tested positive for allergy.   If all of the above answers are "NO", then may proce  . Sulfa Antibiotics Nausea Only    Immunization History  Administered Date(s) Administered  . Influenza Split 07/18/2015  . Influenza,inj,Quad PF,36+ Mos 09/27/2013  . Influenza-Unspecified 07/13/2012  . Pneumococcal  Polysaccharide-23 09/12/2006    Current Medications, Allergies, Past Medical History, Past Surgical History, Family History, and Social History were reviewed in [employer] record.   Review of Systems             All symptoms NEG except where BOLDED >>  Constitutional:  F/C/S, fatigue, anorexia, unexpected weight change. HEENT:  HA, visual changes, hearing loss, earache, nasal symptoms, sore throat, mouth sores, hoarseness. Resp:  cough, sputum, hemoptysis; SOB, tightness, wheezing. Cardio:  CP, palpit, DOE, orthopnea, edema. GI:  N/V/D/C, blood in stool; reflux, abd pain, distention, gas. GU:  dysuria, freq, urgency, hematuria, flank pain, voiding difficulty. MS:  joint pain, swelling, tenderness, decr ROM; neck pain, back pain, etc. Neuro:  HA, tremors, seizures, dizziness, syncope, weakness, numbness, gait abn. Skin:  suspicious lesions or skin rash. Heme:  adenopathy, bruising, bleeding. Psyche:  confusion, agitation, sleep disturbance, hallucinations, anxiety, depression suicidal.   Objective:   Physical Exam       Vital Signs:  Reviewed...   General:  WD, WN, 70 y/o WF in NAD; alert & oriented; pleasant & cooperative... HEENT:  [place]/AT; Conjunctiva- pink, Sclera- nonicteric, EOM-wnl, PERRLA, EACs-clear, TMs-wnl; NOSE-congestede; THROAT-clear & wnl.  Neck:  Supple w/ fair ROM; no JVD; normal carotid impulses w/o bruits; no thyromegaly or nodules palpated; no lymphadenopathy.  Chest:  decr BS at bases w/ scat end-exp wheezing; no rales/ rhonchi/ or signs of consolidation... Heart:  Regular Rhythm; norm S1 & S2 without murmurs, rubs, or gallops detected. Abdomen:  Soft & nontender- no guarding or rebound; normal bowel sounds; no organomegaly or masses palpated. Ext:  Normal ROM; without deformities or arthritic changes; no varicose veins, venous insuffic, or edema;  Pulses intact w/o bruits. Neuro:  No focal neuro deficits, sensory testing normal; gait normal  & balance OK. Derm:  No lesions noted; no rash etc. Lymph:  No cervical, supraclavicular, axillary, or inguinal adenopathy palpated.   Assessment:      IMP >>    Asthma/ AB/ & COPD based on min smoking hx & what sounds like long-term poor airway control> mod persistent, chronic bronchitic type, with a reversible component; FEV1 12/2015= 1.25 (54%); she quit smoking 1984 & has only a 5 pack-yr smoking hx; due to cost issues we have tried to optimize her therapy w/ min expense & tried NEBS w/ DUONEB Qid and BUDESONIDE 0.25Bid but she claims the nebulizer makes her breathing worse; she feels that she does well on BREO + INCRUSE but cost is an issue in the donut hole, where she uses her rescue inhale too often...    Hx allergic rhinitis> eval by [name] [name], we do not have his notes, but pt reports that allergy testing was NEG, despite hx nasal polyposis etc...    Hx OSA> sleep study and rx per [name] [name] in 2015, we do not have any of this  data avail to review...    ENT> per [name] [name] w/ facial pressure & nasal obstruction- eval w/ CT Sinus & flex endoscopy revealed bilat nasal polyps, deviated septum, bilat inferior turbinate hypertrophy, & a large right concha bullosa => s/p endoscopic sinus surg 01/2016 and she reports much improved & able to breathe thru her nose for awhile until her congestion returned; Rec to resume her nasal irrigations....    Medical issues> per [name] [name] w/ HBP, HL, Hypothyroid, GERD, Hx kidney cancer- s/p left nephrectomy, prob secondary hyperpara, osteoporosis (DrFernandez), Anxiety/Depression...  PLAN >>  12/24/15>   Manus Gunning has done well on the [facility] one inhalation daily but the $300/mo is prohibitive; we discussed trial of NEBULIZED medications to see if they work as well and are less expensive; In this regard- start DUONEB BID via Nebulizer and BUDESONIDE 0.25 BID via Nebulizer; she will continue to use the VentolinHFA as needed and her other meds as listed... 03/17/16>    Problems as  noted, she is improved overall but the Bid NEB sched is not working long enough- she still prefers the NEBs over the inhalers due to cost; Rec to incr Duoneb to Qid, continue Budesonide Bid;  SHE NEEDS A NEW SMALLER PORTABLE NEB UNIT=> we will request thru her DME company;  She will try to get [place] sleep apnea data (sleep study date & results, machine info & download data;  We plan ROV in 70mo sooner if needed prn... 09/16/16>    We discussed her dilemma (freq exac & ?worse w/ neb rx) and decided on RX w/ BREO- one inhalation daily, INCRUSE- one inhalation daily, ProairHFA rescue inhaler vs NEB w/ Duoneb as needed;  She will also start Rx w/ her nasal saline irrigations (per [name] [name]), and MUCINEX '600mg'$  Qid w/ fluids;  For her acute prob we will Rx w/ Depo80 + MEDROL Dosepak (she doesn't like Pred);  Finally we will pursue further eval w/ IgE/RAST testing & check a CBC w/ diff (off Pred x several months now) to check EOS & consider a biological if indicated.     Plan:     Patient's Medications  New Prescriptions   METHYLPREDNISOLONE (MEDROL DOSEPAK) 4 MG TBPK TABLET    Take as directed  Previous Medications   ALBUTEROL (PROAIR HFA) 108 (90 BASE) MCG/ACT INHALER    Inhale 1-2 puffs into the lungs every 4 (four) hours as needed for wheezing or shortness of breath.   ALPRAZOLAM (XANAX) 0.5 MG TABLET    Take 0.5-1 mg by mouth at bedtime as needed. Sleep.   AMLODIPINE (NORVASC) 5 MG TABLET    TAKE 1/2 TO 1 TABLET DAILY FOR BLOOD PRESSURE   BRIMONIDINE (ALPHAGAN P) 0.1 % SOLN    Place 1 drop into both eyes 2 (two) times daily.   BUDESONIDE (PULMICORT) 0.25 MG/2ML NEBULIZER SOLUTION    Take 2 mLs (0.25 mg total) by nebulization 2 (two) times daily.   CO-ENZYME Q-10 50 MG CAPSULE    Take 50 mg by mouth daily.   ESCITALOPRAM (LEXAPRO) 20 MG TABLET    Take 20 mg by mouth daily.   IPRATROPIUM-ALBUTEROL (DUONEB) 0.5-2.5 (3) MG/3ML SOLN    Take 3 mLs by nebulization 4 (four) times daily.   LEVOTHYROXINE (SYNTHROID,  LEVOTHROID) 100 MCG TABLET    Take 100 mcg by mouth daily before breakfast.   PANTOPRAZOLE (PROTONIX) 40 MG TABLET    Take 40 mg by mouth daily.   PROBIOTIC PRODUCT (PROBIOTIC PO)    Take 1 tablet by  mouth at bedtime.   Modified Medications   Modified Medication Previous Medication   FLUTICASONE FUROATE-VILANTEROL (BREO ELLIPTA) 200-25 MCG/INH AEPB fluticasone furoate-vilanterol (BREO ELLIPTA) 200-25 MCG/INH AEPB      Inhale 1 puff into the lungs daily.    Inhale 1 puff into the lungs daily.   INCRUSE ELLIPTA 62.5 MCG/INH AEPB INCRUSE ELLIPTA 62.5 MCG/INH AEPB      Inhale 1 puff into the lungs daily.    Inhale 1 puff into the lungs daily.   LEVOCETIRIZINE (XYZAL) 5 MG TABLET levocetirizine (XYZAL) 5 MG tablet      Take 1 tablet (5 mg total) by mouth daily.    Take 5 mg by mouth daily.  Discontinued Medications   BESIVANCE 0.6 % SUSP       DORZOLAMIDE-TIMOLOL (COSOPT) 22.3-6.8 MG/ML OPHTHALMIC SOLUTION    Place 1 drop into the left eye 2 (two) times daily.   HYDRALAZINE (APRESOLINE) 25 MG TABLET    Take 25 mg by mouth 2 (two) times daily.    HYDROCODONE-ACETAMINOPHEN (NORCO/VICODIN) 5-325 MG TABLET    Take 1 tablet by mouth every 4 (four) hours as needed.   VENTOLIN HFA 108 (90 BASE) MCG/ACT INHALER    Inhale 1-2 puffs into the lungs every 4 (four) hours as needed. Ever  4-6 hours prn cough or wheeze.

## 2016-09-16 NOTE — Patient Instructions (Signed)
Today we updated your med list in our EPIC system...    Continue your current medications the same...  Today we checked some specialty lab tests-- IgE level, RAST test, and CBC w/ diff to check your eosinophils...    We will contact you w/ the results when available...   For the acute nasal congestion & green drainage>>    Start back on your nasal irrigations per DrTeoh...    We gave you a Depo shot today...    Start the [place] in the AM & follw the directions on the pack...  From your history-- it seems as though the combo on BREO one inhalation daily & INCRUSE one inhalation daily works well for you, so lets continue this regularly...    You may use the NEBULIZER or the rescue inhaler as needed...  In addition please start the OTC MUCINEX- 600mg  one tab 4 times daily w/ lots of fluids...   Call for any questions...  Let's plan a follow up visit in 6-8wks, sooner if needed for problems.Marland KitchenMarland Kitchen

## 2016-09-17 LAB — RESPIRATORY ALLERGY PROFILE REGION II ~~LOC~~
Allergen, Cedar tree, t12: <0.1 kU/L
Allergen, Comm Silver Birch, t9: <0.1 kU/L
Allergen, Cottonwood, t14: <0.1 kU/L
Allergen, Mouse Urine Protein, e78: <0.1 kU/L
Bermuda Grass: <0.1 kU/L
Cat Dander: <0.1 kU/L
Dog Dander: <0.1 kU/L
Elm IgE: <0.1 kU/L
IgE (Immunoglobulin E), Serum: 83 kU/L (ref <115)

## 2016-09-17 MED ORDER — UMECLIDINIUM BROMIDE 62.5 MCG/INH IN AEPB: 1.0000 | INHALATION_SPRAY | Freq: Every day | RESPIRATORY_TRACT | 0 refills | Status: DC

## 2016-09-17 MED ORDER — ALBUTEROL SULFATE 108 (90 BASE) MCG/ACT IN AEPB: 1.0000 | INHALATION_SPRAY | Freq: Four times a day (QID) | RESPIRATORY_TRACT | 0 refills | Status: DC | PRN

## 2016-09-17 MED ORDER — FLUTICASONE FUROATE-VILANTEROL 200-25 MCG/INH IN AEPB: 1.0000 | INHALATION_SPRAY | Freq: Every day | RESPIRATORY_TRACT | 0 refills | Status: DC

## 2016-09-19 ENCOUNTER — Ambulatory Visit
Admission: RE | Admit: 2016-09-19 | Discharge: 2016-09-19 | Disposition: A | Discharge status: Home / Self Care | Payer: Medicare Other | Source: Ambulatory Visit | Attending: Family Medicine | Admitting: Family Medicine

## 2016-09-19 DIAGNOSIS — Z1231 Encounter for screening mammogram for malignant neoplasm of breast: Secondary | ICD-10-CM

## 2016-10-02 ENCOUNTER — Encounter: Payer: Self-pay | Admitting: Gynecology

## 2016-10-09 ENCOUNTER — Ambulatory Visit (INDEPENDENT_AMBULATORY_CARE_PROVIDER_SITE_OTHER): Payer: Medicare Other | Admitting: Women's Health

## 2016-10-09 ENCOUNTER — Encounter: Payer: Self-pay | Admitting: Women's Health

## 2016-10-09 VITALS — BP 122/79 | Ht 66.0 in | Wt 158.6 lb

## 2016-10-09 DIAGNOSIS — E213 Hyperparathyroidism, unspecified: Secondary | ICD-10-CM

## 2016-10-09 DIAGNOSIS — M818 Other osteoporosis without current pathological fracture: Secondary | ICD-10-CM | POA: Diagnosis not present

## 2016-10-09 DIAGNOSIS — Z01411 Encounter for gynecological examination (general) (routine) with abnormal findings: Secondary | ICD-10-CM

## 2016-10-09 NOTE — Patient Instructions (Signed)
Osteoporosis Osteoporosis is the thinning and loss of density in the bones. Osteoporosis makes the bones more brittle, fragile, and likely to break (fracture). Over time, osteoporosis can cause the bones to become so weak that they fracture after a simple fall. The bones most likely to fracture are the bones in the hip, wrist, and spine. What are the causes? The exact cause is not known. What increases the risk? Anyone can develop osteoporosis. You may be at greater risk if you have a family history of the condition or have poor nutrition. You may also have a higher risk if you are:  Female.  69 years old or older.  A smoker.  Not physically active.  White or Asian.  Slender. What are the signs or symptoms? A fracture might be the first sign of the disease, especially if it results from a fall or injury that would not usually cause a bone to break. Other signs and symptoms include:  Low back and neck pain.  Stooped posture.  Height loss. How is this diagnosed? To make a diagnosis, your health care provider may:  Take a medical history.  Perform a physical exam.  Order tests, such as:  A bone mineral density test.  A dual-energy X-ray absorptiometry test. How is this treated? The goal of osteoporosis treatment is to strengthen your bones to reduce your risk of a fracture. Treatment may involve:  Making lifestyle changes, such as:  Eating a diet rich in calcium.  Doing weight-bearing and muscle-strengthening exercises.  Stopping tobacco use.  Limiting alcohol intake.  Taking medicine to slow the process of bone loss or to increase bone density.  Monitoring your levels of calcium and vitamin D. Follow these instructions at home:  Include calcium and vitamin D in your diet. Calcium is important for bone health, and vitamin D helps the body absorb calcium.  Perform weight-bearing and muscle-strengthening exercises as directed by your health care provider.  Do  not use any tobacco products, including cigarettes, chewing tobacco, and electronic cigarettes. If you need help quitting, ask your health care provider.  Limit your alcohol intake.  Take medicines only as directed by your health care provider.  Keep all follow-up visits as directed by your health care provider. This is important.  Take precautions at home to lower your risk of falling, such as:  Keeping rooms well lit and clutter free.  Installing safety rails on stairs.  Using rubber mats in the bathroom and other areas that are often wet or slippery. Get help right away if: You fall or injure yourself. This information is not intended to replace advice given to you by your health care provider. Make sure you discuss any questions you have with your health care provider. Document Released: 07/09/2005 Document Revised: 03/03/2016 Document Reviewed: 03/09/2014 Elsevier Interactive Patient Education  2017 [place] Maintenance for Postmenopausal Women Introduction Menopause is a normal process in which your reproductive ability comes to an end. This process happens gradually over a span of months to years, usually between the ages of 59 and 19. Menopause is complete when you have missed 12 consecutive menstrual periods. It is important to talk with your health care provider about some of the most common conditions that affect postmenopausal women, such as heart disease, cancer, and bone loss (osteoporosis). Adopting a healthy lifestyle and getting preventive care can help to promote your health and wellness. Those actions can also lower your chances of developing some of these common conditions. What should I  know about menopause? During menopause, you may experience a number of symptoms, such as:  Moderate-to-severe hot flashes.  Night sweats.  Decrease in sex drive.  Mood swings.  Headaches.  Tiredness.  Irritability.  Memory problems.  Insomnia. Choosing to  treat or not to treat menopausal changes is an individual decision that you make with your health care provider. What should I know about hormone replacement therapy and supplements? Hormone therapy products are effective for treating symptoms that are associated with menopause, such as hot flashes and night sweats. Hormone replacement carries certain risks, especially as you become older. If you are thinking about using estrogen or estrogen with progestin treatments, discuss the benefits and risks with your health care provider. What should I know about heart disease and stroke? Heart disease, heart attack, and stroke become more likely as you age. This may be due, in part, to the hormonal changes that your body experiences during menopause. These can affect how your body processes dietary fats, triglycerides, and cholesterol. Heart attack and stroke are both medical emergencies. There are many things that you can do to help prevent heart disease and stroke:  Have your blood pressure checked at least every 1-2 years. High blood pressure causes heart disease and increases the risk of stroke.  If you are 20-72 years old, ask your health care provider if you should take aspirin to prevent a heart attack or a stroke.  Do not use any tobacco products, including cigarettes, chewing tobacco, or electronic cigarettes. If you need help quitting, ask your health care provider.  It is important to eat a healthy diet and maintain a healthy weight.  Be sure to include plenty of vegetables, fruits, low-fat dairy products, and lean protein.  Avoid eating foods that are high in solid fats, added sugars, or salt (sodium).  Get regular exercise. This is one of the most important things that you can do for your health.  Try to exercise for at least 150 minutes each week. The type of exercise that you do should increase your heart rate and make you sweat. This is known as moderate-intensity exercise.  Try to do  strengthening exercises at least twice each week. Do these in addition to the moderate-intensity exercise.  Know your numbers.Ask your health care provider to check your cholesterol and your blood glucose. Continue to have your blood tested as directed by your health care provider. What should I know about cancer screening? There are several types of cancer. Take the following steps to reduce your risk and to catch any cancer development as early as possible. Breast Cancer  Practice breast self-awareness.  This means understanding how your breasts normally appear and feel.  It also means doing regular breast self-exams. Let your health care provider know about any changes, no matter how small.  If you are 62 or older, have a clinician do a breast exam (clinical breast exam or CBE) every year. Depending on your age, family history, and medical history, it may be recommended that you also have a yearly breast X-ray (mammogram).  If you have a family history of breast cancer, talk with your health care provider about genetic screening.  If you are at high risk for breast cancer, talk with your health care provider about having an MRI and a mammogram every year.  Breast cancer (BRCA) gene test is recommended for women who have family members with BRCA-related cancers. Results of the assessment will determine the need for genetic counseling and BRCA1 and  for BRCA2 testing. BRCA-related cancers include these types:  Breast. This occurs in males or females.  Ovarian.  Tubal. This may also be called fallopian tube cancer.  Cancer of the abdominal or pelvic lining (peritoneal cancer).  Prostate.  Pancreatic. Cervical, Uterine, and Ovarian Cancer  Your health care provider may recommend that you be screened regularly for cancer of the pelvic organs. These include your ovaries, uterus, and vagina. This screening involves a pelvic exam, which includes checking for microscopic changes to the  surface of your cervix (Pap test).  For women ages 21-65, health care providers may recommend a pelvic exam and a Pap test every three years. For women ages 60-65, they may recommend the Pap test and pelvic exam, combined with testing for human papilloma virus (HPV), every five years. Some types of HPV increase your risk of cervical cancer. Testing for HPV may also be done on women of any age who have unclear Pap test results.  Other health care providers may not recommend any screening for nonpregnant women who are considered low risk for pelvic cancer and have no symptoms. Ask your health care provider if a screening pelvic exam is right for you.  If you have had past treatment for cervical cancer or a condition that could lead to cancer, you need Pap tests and screening for cancer for at least 20 years after your treatment. If Pap tests have been discontinued for you, your risk factors (such as having a new sexual partner) need to be reassessed to determine if you should start having screenings again. Some women have medical problems that increase the chance of getting cervical cancer. In these cases, your health care provider may recommend that you have screening and Pap tests more often.  If you have a family history of uterine cancer or ovarian cancer, talk with your health care provider about genetic screening.  If you have vaginal bleeding after reaching menopause, tell your health care provider.  There are currently no reliable tests available to screen for ovarian cancer. Lung Cancer  Lung cancer screening is recommended for adults 61-28 years old who are at high risk for lung cancer because of a history of smoking. A yearly low-dose CT scan of the lungs is recommended if you:  Currently smoke.  Have a history of at least 30 pack-years of smoking and you currently smoke or have quit within the past 15 years. A pack-year is smoking an average of one pack of cigarettes per day for one  year. Yearly screening should:  Continue until it has been 15 years since you quit.  Stop if you develop a health problem that would prevent you from having lung cancer treatment. Colorectal Cancer  This type of cancer can be detected and can often be prevented.  Routine colorectal cancer screening usually begins at age 8 and continues through age 38.  If you have risk factors for colon cancer, your health care provider may recommend that you be screened at an earlier age.  If you have a family history of colorectal cancer, talk with your health care provider about genetic screening.  Your health care provider may also recommend using home test kits to check for hidden blood in your stool.  A small camera at the end of a tube can be used to examine your colon directly (sigmoidoscopy or colonoscopy). This is done to check for the earliest forms of colorectal cancer.  Direct examination of the colon should be repeated every 5-10 years until  age 26. However, if early forms of precancerous polyps or small growths are found or if you have a family history or genetic risk for colorectal cancer, you may need to be screened more often. Skin Cancer  Check your skin from head to toe regularly.  Monitor any moles. Be sure to tell your health care provider:  About any new moles or changes in moles, especially if there is a change in a mole's shape or color.  If you have a mole that is larger than the size of a pencil eraser.  If any of your family members has a history of skin cancer, especially at a young age, talk with your health care provider about genetic screening.  Always use sunscreen. Apply sunscreen liberally and repeatedly throughout the day.  Whenever you are outside, protect yourself by wearing long sleeves, pants, a wide-brimmed hat, and sunglasses. What should I know about osteoporosis? Osteoporosis is a condition in which bone destruction happens more quickly than new bone  creation. After menopause, you may be at an increased risk for osteoporosis. To help prevent osteoporosis or the bone fractures that can happen because of osteoporosis, the following is recommended:  If you are 64-70 years old, get at least 1,000 mg of calcium and at least 600 mg of vitamin D per day.  If you are older than age 80 but younger than age 21, get at least 1,200 mg of calcium and at least 600 mg of vitamin D per day.  If you are older than age 38, get at least 1,200 mg of calcium and at least 800 mg of vitamin D per day. Smoking and excessive alcohol intake increase the risk of osteoporosis. Eat foods that are rich in calcium and vitamin D, and do weight-bearing exercises several times each week as directed by your health care provider. What should I know about how menopause affects my mental health? Depression may occur at any age, but it is more common as you become older. Common symptoms of depression include:  Low or sad mood.  Changes in sleep patterns.  Changes in appetite or eating patterns.  Feeling an overall lack of motivation or enjoyment of activities that you previously enjoyed.  Frequent crying spells. Talk with your health care provider if you think that you are experiencing depression. What should I know about immunizations? It is important that you get and maintain your immunizations. These include:  Tetanus, diphtheria, and pertussis (Tdap) booster vaccine.  Influenza every year before the flu season begins.  Pneumonia vaccine.  Shingles vaccine. Your health care provider may also recommend other immunizations. This information is not intended to replace advice given to you by your health care provider. Make sure you discuss any questions you have with your health care provider. Document Released: 11/21/2005 Document Revised: 04/18/2016 Document Reviewed: 07/03/2015  2017 Elsevier

## 2016-10-09 NOTE — Progress Notes (Addendum)
[name] [name] 1945/12/07 [id number]    History:    Presents for annual exam.  TAH. Normal Pap and mammogram history. 2002 [facility] left nephrectomy. History of melanoma has biannual checks. Osteoporosis T score -3 femoral neck Prolia started 09/2015. Was noted to have elevated PTH was scheduled an appointment but did cancel, PC stated she did not need. PTH increased from 68, now 197 07/2016. Calcium level normal range. Has had Pneumovax, not Zostavax. Has annual flu vaccine. Had a traumatic fall April 2017 no fractures sprained ankle only.  Past medical history, past surgical history, family history and social history were all reviewed and documented in the EPIC chart. Retired, getting along well with daughter, history of drug abuse now doing well. Mother 9D lives independently.  ROS:  A ROS was performed and pertinent positives and negatives are included.  Exam:  Vitals:   10/09/16 1149  BP: 122/79  Weight: 158 lb 9.6 oz (71.9 kg)  Height: 5\' 6"  (1.676 m)   Body mass index is 25.6 kg/m.   General appearance:  Normal Thyroid:  Symmetrical, normal in size, without palpable masses or nodularity. Respiratory  Auscultation:  Clear without wheezing or rhonchi Cardiovascular  Auscultation:  Regular rate, without rubs, murmurs or gallops  Edema/varicosities:  Not grossly evident Abdominal  Soft,nontender, without masses, guarding or rebound.  Liver/spleen:  No organomegaly noted  Hernia:  None appreciated  Skin  Inspection:  Grossly normal   Breasts: Examined lying and sitting.     Right: Without masses, retractions, discharge or axillary adenopathy.     Left: Without masses, retractions, discharge or axillary adenopathy. Gentitourinary   Inguinal/mons:  Normal without inguinal adenopathy  External genitalia:  Normal  BUS/Urethra/Skene's glands:  Normal  Vagina:  Normal  Cervix:  And uterus absent Adnexa/parametria:     Rt: Without masses or tenderness.   Lt: Without masses or  tenderness.  Anus and perineum: Normal  Digital rectal exam: Normal sphincter tone without palpated masses or tenderness  Assessment/Plan:  70 y.o. MWF G1 P1  for rest and pelvic exam.  TAH on no HRT Osteoporosis first dose Prolia 09/2015 Elevated PTH without follow-up  2002  [place] left nephrectomy Melanoma-dermatologist manages Hypertension/asthma/hypothyroidism-primary care manages labs and meds  Plan: Will schedule follow-up with endocrinologist reviewed importance of continuing. Will hold Prolia today and will follow-up after PTH elevation evaluated. SBE's, continue annual 3-D screening mammogram, calcium rich diet, vitamin D 1000 daily encouraged. Vitamin D level 39. Home safety, fall prevention and importance of weightbearing exercise reviewed, encourage yoga. Zostavax recommended.    Huel Cote WHNP, 12:30 PM 10/09/2016

## 2016-10-10 ENCOUNTER — Telehealth: Payer: Self-pay | Admitting: *Deleted

## 2016-10-10 DIAGNOSIS — R7989 Other specified abnormal findings of blood chemistry: Secondary | ICD-10-CM

## 2016-10-10 NOTE — Telephone Encounter (Signed)
-----   Message from Huel Cote, NP sent at 10/09/2016 11:58 AM EST ----- Needs an appt with endocrinologist for elevated PTH.  Had appt prior BUT did not keep PC said didn't need.  Will go this time

## 2016-10-10 NOTE — Telephone Encounter (Signed)
Referral placed they will contact pt to schedule. 

## 2016-10-14 NOTE — Telephone Encounter (Signed)
Plan: Will schedule follow-up with endocrinologist reviewed importance of continuing. Will hold Prolia today and will follow-up after PTH elevation evaluated. Prolia cancelled per Scherrie Gerlach

## 2016-10-20 NOTE — Telephone Encounter (Signed)
Talked with [name] [name] she wants to wait until 11/2015 for follow up Prolia after PTH evaluation.

## 2016-10-24 NOTE — Telephone Encounter (Signed)
Appointment 11/21/16 @ 2:15pm

## 2016-10-28 ENCOUNTER — Ambulatory Visit: Payer: Medicare Other | Admitting: Pulmonary Disease

## 2016-11-21 ENCOUNTER — Encounter: Payer: Self-pay | Admitting: Internal Medicine

## 2016-11-21 ENCOUNTER — Ambulatory Visit (INDEPENDENT_AMBULATORY_CARE_PROVIDER_SITE_OTHER): Payer: Medicare Other | Admitting: Internal Medicine

## 2016-11-21 VITALS — BP 124/74 | HR 84 | Ht 65.0 in | Wt 162.0 lb

## 2016-11-21 DIAGNOSIS — E213 Hyperparathyroidism, unspecified: Secondary | ICD-10-CM | POA: Diagnosis not present

## 2016-11-21 DIAGNOSIS — E211 Secondary hyperparathyroidism, not elsewhere classified: Secondary | ICD-10-CM | POA: Diagnosis not present

## 2016-11-21 DIAGNOSIS — M810 Age-related osteoporosis without current pathological fracture: Secondary | ICD-10-CM

## 2016-11-21 LAB — BASIC METABOLIC PANEL WITH GFR
BUN: 18 mg/dL (ref 7–25)
CHLORIDE: 106 mmol/L (ref 98–110)
CO2: 23 mmol/L (ref 20–31)
Calcium: 9.3 mg/dL (ref 8.6–10.4)
Creat: 0.91 mg/dL (ref 0.50–0.99)
GFR, EST NON AFRICAN AMERICAN: 65 mL/min (ref >=60)
GFR, Est African American: 74 mL/min (ref >=60)
Glucose, Bld: 93 mg/dL (ref 65–99)
POTASSIUM: 4.1 mmol/L (ref 3.5–5.3)
Sodium: 138 mmol/L (ref 135–146)

## 2016-11-21 LAB — VITAMIN D 25 HYDROXY (VIT D DEFICIENCY, FRACTURES): VITD: 27.33 ng/mL — AB (ref 30.00–100.00)

## 2016-11-21 LAB — PHOSPHORUS: Phosphorus: 2.1 mg/dL — ABNORMAL LOW (ref 2.3–4.6)

## 2016-11-21 LAB — MAGNESIUM: Magnesium: 2.2 mg/dL (ref 1.5–2.5)

## 2016-11-21 NOTE — Progress Notes (Signed)
Patient ID: MERRIEL KENDRICK, female   DOB: [birth year], 72 y.o.   MRN: [id number]    HPI  SIMRANPREET FALZON is a 71 y.o.-year-old female, referred by Elon Alas, NP, for evaluation for normocalcemic hyperparathyroidism.  Pt was found to have a high PTH in 2015 by PCP in [place].   I reviewed pt's pertinent labs: 07/2016: PTH: 195, Ca 9.0 Lab Results  Component Value Date   PTH 156 (H) 03/04/2016   PTH 95 (H) 10/01/2015   PTH 68 (H) 08/21/2015   CALCIUM 8.5 03/04/2016   CALCIUM 9.5 11/08/2015   CALCIUM 9.2 08/21/2015   CALCIUM 9.0 04/17/2015   CALCIUM 9.1 10/28/2013   CALCIUM 9.5 08/22/2013   CALCIUM 9.3 03/08/2008   CALCIUM 10.0 12/03/2007   CALCIUM 9.4 07/20/2007   She also has OP. I reviewed pt's DEXA scans: Date L1-L4 T score FN T score 33% distal Radius  08/2015:  -1.4 LFN: -3.2 RFN: -3.1    She was on Prolia since 03/2015 >> taken off 09/2016 b/c fear of hypocalcemia/secondary hyperparathyroidism.  No fractures but fell down the stairs in 2017 >> sprained ankle but no fx.  She does not have a history of early menopause.  No h/o kidney stones. She only has one kidney - [place] 2002. Sees Dr. Janice Norrie.  No h/o CKD. Last BUN/Cr: Lab Results  Component Value Date   BUN 16 11/08/2015   BUN 20 04/17/2015   CREATININE 0.99 11/08/2015   CREATININE 1.05 (H) 04/17/2015   Pt is not on HCTZ.  She does have a h/o vitamin D deficiency. Reviewed latest vit D levels: 07/2016: vit D 39.47 Lab Results  Component Value Date   VD25OH 40 03/04/2016   VD25OH 39 10/01/2015   VD25OH 57 10/28/2013   VD25OH 59 08/22/2013   Pt is on vitamin D 2000-3000 units; she also eats dairy and green, leafy, vegetables.  Pt does not have a FH of hypercalcemia, pituitary tumors, thyroid cancer, or osteoporosis.   She has a h/o melanoma in 2015. She denies any gastric/intestinal surgeries. She has lfc colitis. She also has hypothyroidism, with the latest TFTs normal  reportedly.  ROS: Constitutional: no weight gain/loss, + fatigue, no subjective hyperthermia/hypothermia, + nocturia, + poor sleep Eyes: + blurry vision (glaucoma), no xerophthalmia ENT: no sore throat, no nodules palpated in throat, + dysphagia/no odynophagia, no hoarseness, + decrease hearing  - deaf in L ear Cardiovascular: no CP/+ SOB/no palpitations/leg swelling Respiratory: no cough/+ SOB/+ wheezing Gastrointestinal: no N/V/D/C Musculoskeletal: no muscle/joint aches Skin: no rashes Neurological: no tremors/numbness/tingling/dizziness Psychiatric: no depression/anxiety  Past Medical History:  Diagnosis Date  . Allergy   . Anxiety   . Asthma   . Cancer ([place])    Kidney cancer-Hypernephroma  . Cervical dysplasia   . COPD (chronic obstructive pulmonary disease) ([place])   . Depression   . Elevated cholesterol   . GERD (gastroesophageal reflux disease)   . Glaucoma   . Hypertension   . Lymphocytic colitis   . Osteoporosis 08/2015   T score -3.2  . Serum calcium elevated    Past Surgical History:  Procedure Laterality Date  . ABDOMINAL HYSTERECTOMY  1989   TAH LSO  . AUGMENTATION MAMMAPLASTY     Implants removed  . COLPOSCOPY    . COSMETIC SURGERY    . EYE SURGERY  2017  . KIDNEY REMOVED  2002   Left  . NOSE SURGERY  2017  . OOPHORECTOMY     LSO  . TONSILLECTOMY  Social History   Social History  . Marital status: Married    Spouse name: N/A  . Number of children: 1   Occupational History  . retired   Social History Main Topics  . Smoking status: Former Smoker    Packs/day: 5.00    Years: 10.00    Types: Cigarettes    Quit date: 08/23/1983  . Smokeless tobacco: Never Used  . Alcohol use 12.6 oz/week    21 Standard drinks or equivalent per week     Comment: social  . Drug use: No  . Sexual activity: Not Currently    Birth control/ protection: Surgical     Comment: HYST   Current Outpatient Prescriptions on File Prior to Visit  Medication Sig  Dispense Refill  . ALPRAZolam (XANAX) 0.5 MG tablet Take 0.5-1 mg by mouth at bedtime as needed. Sleep.  0  . amLODipine (NORVASC) 5 MG tablet TAKE 1/2 TO 1 TABLET DAILY FOR BLOOD PRESSURE 30 tablet 4  . budesonide (PULMICORT) 0.25 MG/2ML nebulizer solution Take 2 mLs (0.25 mg total) by nebulization 2 (two) times daily. 60 mL 12  . co-enzyme Q-10 50 MG capsule Take 50 mg by mouth daily.    Marland Kitchen escitalopram (LEXAPRO) 20 MG tablet Take 20 mg by mouth daily.  1  . fluticasone furoate-vilanterol (BREO ELLIPTA) 200-25 MCG/INH AEPB Inhale 1 puff into the lungs daily. 60 each 5  . INCRUSE ELLIPTA 62.5 MCG/INH AEPB Inhale 1 puff into the lungs daily. 30 each 5  . levocetirizine (XYZAL) 5 MG tablet Take 1 tablet (5 mg total) by mouth daily. 30 tablet 5  . levothyroxine (SYNTHROID, LEVOTHROID) 100 MCG tablet Take 100 mcg by mouth daily before breakfast.    . pantoprazole (PROTONIX) 40 MG tablet Take 40 mg by mouth daily.    . Probiotic Product (PROBIOTIC PO) Take 1 tablet by mouth at bedtime.     Marland Kitchen albuterol (PROAIR HFA) 108 (90 Base) MCG/ACT inhaler Inhale 1-2 puffs into the lungs every 4 (four) hours as needed for wheezing or shortness of breath. (Patient not taking: Reported on 11/21/2016) 1 Inhaler 3  . Albuterol Sulfate (PROAIR RESPICLICK) 123XX123 (90 Base) MCG/ACT AEPB Inhale 1-2 puffs into the lungs every 6 (six) hours as needed. (Patient not taking: Reported on 11/21/2016) 1 each 0  . ipratropium-albuterol (DUONEB) 0.5-2.5 (3) MG/3ML SOLN Take 3 mLs by nebulization 4 (four) times daily. (Patient not taking: Reported on 11/21/2016) 120 mL 11   No current facility-administered medications on file prior to visit.    Allergies  Allergen Reactions  . Iodine     Pt has only one kidney.  . Iohexol      Code: RASH, Desc: PT STATES SHE HAS ONE KIDNEY SO NOT TO USE IV DYE/10/27/06/RM, Onset Date: [id number]   . Penicillins       . Sulfa Antibiotics Nausea Only   Family History  Problem Relation Age of Onset  .  Hypertension Mother   . Heart disease Mother   . Asthma Mother   . Lung cancer Father   . Cancer Father     lung  . Hypertension Sister   . Cancer Sister     Stem cell cancer  . Hypertension Brother   . Stroke Brother   . Ovarian cancer Maternal Aunt   . Breast cancer Maternal Aunt     Age 40's  . Cancer Maternal Uncle     Prostate cancer  . Breast cancer Maternal Aunt  Age 41's    PE: BP 124/74 (BP Location: Left Arm, Patient Position: Sitting)   Pulse 84   Ht 5\' 5"  (1.651 m)   Wt 162 lb (73.5 kg)   SpO2 97%   BMI 26.96 kg/m  Wt Readings from Last 3 Encounters:  11/21/16 162 lb (73.5 kg)  10/09/16 158 lb 9.6 oz (71.9 kg)  09/16/16 158 lb (71.7 kg)   Constitutional: overweight, in NAD. No kyphosis. Eyes: PERRLA, EOMI, no exophthalmos ENT: moist mucous membranes, no thyromegaly, no cervical lymphadenopathy Cardiovascular: RRR, No MRG Respiratory: CTA B Gastrointestinal: abdomen soft, NT, ND, BS+ Musculoskeletal: no deformities, strength intact in all 4 Skin: moist, warm, no rashes Neurological: no tremor with outstretched hands, DTR normal in all 4  Assessment: 1. Normocalcemic hyperparathyroidism  2. Osteoporosis  Plan: Patient has had Normal calcium levels, however she has had increasing PTH levels, the highest being 195, with an associated calcium of 9.0.  - She does not have vitamin D deficiency,  her last level was 39.5 in 07/2016 - She denies h/o nephrolithiasis, but does have a significant history of osteoporosis (no fractures). - I discussed with the patient about the physiology of calcium and parathyroid hormone, and possible side effects from increased PTH, including kidney stones (however, this would be unlikely without an associated increased calcium) osteoporosis.  - We discussed that we need to check whether her hyperparathyroidism is primary (Familial hypercalcemic hypocalciuria or parathyroid adenoma) or secondary (to conditions like: vitamin D  deficiency, calcium malabsorption, hypercalciuria, renal insufficiency, etc.). In the absence of an elevated calcium, I strongly believe that she has secondary hyperparathyroidism. Since she does not have a low vitamin D, the suspicion remains for calcium malabsorption or inappropriate vitamin D activation. She does not have a history of GI surgeries, but she is on Protonix which can reduce calcium absorption. - I would like to check the following labs: calcium level intact PTH ([facility]) Magnesium Phosphorus vitamin D- 25 HO and 1,25 HO 24h urinary calcium/creatinine ratio - given instructions for urine collection - I did not suggest to start calcium supplements for now, but we may need to do so if her serum or urinary calcium is low. Since she is on Protonix, I would probably suggest calcium citrate since this is better absorbed.  - For now, I advised her to continue the current dose of vitamin D supplement, 3000 units daily, and we will adjust the dose based on the results of the vitamin D level  - I will see the patient back in 6 months  2. Osteoporosis - She went through menopause in her 66s - She has been on steroid tapers before for URIs - She has been started on Prolia and she had 3 doses. However, she was told to discontinue this due to possible hypocalcemia. We discussed today that stopping Prolia abruptly, without continuing with another medicine was unfortunately decrease her BMD precipitously and she may lose the benefit from Prolia. Therefore, I suggested to restart Prolia while we are investigating and treating her secondary hyperparathyroidism. She agrees with this.  - We will start a PA for Prolia since she changed her insurance. If this is not approved, we may try Reclast or Fosamax weekly.   Orders Placed This Encounter  Procedures  . VITAMIN D 25 Hydroxy (Vit-D Deficiency, Fractures)  . Vitamin D 1,25 dihydroxy  . Phosphorus  . Magnesium  . Parathyroid hormone, intact (no  Ca)  . Creatinine, urine, 24 hour  . Calcium, urine, 24  hour  . BASIC METABOLIC PANEL WITH GFR   Component     Latest Ref Rng & Units 11/21/2016 11/24/2016  Sodium     135 - 146 mmol/L 138   Potassium     3.5 - 5.3 mmol/L 4.1   Chloride     98 - 110 mmol/L 106   CO2     20 - 31 mmol/L 23   Glucose     65 - 99 mg/dL 93   BUN     7 - 25 mg/dL 18   Creatinine     0.50 - 0.99 mg/dL 0.91   Calcium     8.6 - 10.4 mg/dL 9.3   GFR, Est African American     >=60 mL/min 74   GFR, Est Non African American     >=60 mL/min 65   Vitamin D 1, 25 (OH) Total     18 - 72 pg/mL 31   Vitamin D3 1, 25 (OH)     pg/mL 31   Vitamin D2 1, 25 (OH)     pg/mL <8   PTH     15 - 65 pg/mL 55   Creatinine, Urine     20 - 320 mg/dL  52  Creatinine, 24H Ur     0.63 - 2.50 g/24 h  1.09  Calcium, Ur     Not estab mg/dL  3  Calcium, 24 hour urine     35 - 250 mg/24 h  63  Magnesium     1.5 - 2.5 mg/dL 2.2   VITD     30.00 - 100.00 ng/mL 27.33 (L)   Phosphorus     2.3 - 4.6 mg/dL 2.1 (L)    Slightly low vitamin D >> will suggest to increase supplement dose to 5000 units daily. Slightly low phos (? If from the low vitamin D). Low urinary calcium >> low calcium GI absorption >> will suggest to start calcium citrate 600 mg 2x a day. Normal 1,25 diHO vitamin D and also normal PTH. Will repeat labs when she comes back but no other intervention needed.  CC: Dr. Lawerance Cruel, MD PhD [facility]

## 2016-11-21 NOTE — Patient Instructions (Signed)
Please stop at the lab.  We will start the PA for Prolia.  Patient information (Up-to-Date): Collection of a 24-hour urine specimen  - You should collect every drop of urine during each 24-hour period. It does not matter how much or little urine is passed each time, as long as every drop is collected. - Begin the urine collection in the morning after you wake up, after you have emptied your bladder for the first time. - Urinate (empty the bladder) for the first time and flush it down the toilet. Note the exact time (eg, 6:15 AM). You will begin the urine collection at this time. - Collect every drop of urine during the day and night in an empty collection bottle. Store the bottle at room temperature or in the refrigerator. - If you need to have a bowel movement, any urine passed with the bowel movement should be collected. Try not to include feces with the urine collection. If feces does get mixed in, do not try to remove the feces from the urine collection bottle. - Finish by collecting the first urine passed the next morning, adding it to the collection bottle. This should be within ten minutes before or after the time of the first morning void on the first day (which was flushed). In this example, you would try to void between 6:05 and 6:25 on the second day. - If you need to urinate one hour before the final collection time, drink a full glass of water so that you can void again at the appropriate time. If you have to urinate 20 minutes before, try to hold the urine until the proper time. - Please note the exact time of the final collection, even if it is not the same time as when collection began on day 1. - The bottle(s) may be kept at room temperature for a day or two, but should be kept cool or refrigerated for longer periods of time.  Please return in 6 months.

## 2016-11-22 LAB — PARATHYROID HORMONE, INTACT (NO CA): PTH: 55 pg/mL (ref 15–65)

## 2016-11-25 LAB — CALCIUM, URINE, 24 HOUR
CALCIUM 24HR UR: 63 mg/(24.h) (ref 35–250)
CALCIUM UR: 3 mg/dL

## 2016-11-25 LAB — VITAMIN D 1,25 DIHYDROXY
Vitamin D 1, 25 (OH)2 Total: 31 pg/mL (ref 18–72)
Vitamin D3 1, 25 (OH)2: 31 pg/mL

## 2016-11-25 LAB — CREATININE, URINE, 24 HOUR
CREATININE 24H UR: 1.09 g/(24.h) (ref 0.63–2.50)
CREATININE, URINE: 52 mg/dL (ref 20–320)

## 2016-11-25 NOTE — Telephone Encounter (Signed)
Message left

## 2016-11-25 NOTE — Telephone Encounter (Signed)
Pt was seen by Endocrinologist, notes state that patient will re start Prolia or Reclast thru this M.D. I will forward this note to Elon Alas, NP

## 2016-11-26 ENCOUNTER — Ambulatory Visit: Payer: Medicare Other | Admitting: Pulmonary Disease

## 2016-11-26 VITALS — BP 100/64 | HR 64 | Temp 97.0°F | Ht 66.0 in | Wt 162.1 lb

## 2016-11-26 DIAGNOSIS — Z23 Encounter for immunization: Secondary | ICD-10-CM | POA: Diagnosis not present

## 2016-11-26 DIAGNOSIS — J449 Chronic obstructive pulmonary disease, unspecified: Secondary | ICD-10-CM

## 2016-11-26 DIAGNOSIS — Z8669 Personal history of other diseases of the nervous system and sense organs: Secondary | ICD-10-CM

## 2016-11-26 DIAGNOSIS — J301 Allergic rhinitis due to pollen: Secondary | ICD-10-CM

## 2016-11-26 DIAGNOSIS — D721 Eosinophilia, unspecified: Secondary | ICD-10-CM

## 2016-11-26 DIAGNOSIS — J339 Nasal polyp, unspecified: Secondary | ICD-10-CM

## 2016-11-26 NOTE — Patient Instructions (Signed)
Today we updated your med list in our EPIC system...    Continue your current medications the same...  We discussed the poss of using a "biologic" like Nucala or Fasenra for you asthma w/ eosinophilia, But we decided to hold off for now since you are doing so well on the [place]...  Check w/ your PartD drug plan regarding a less expensive option for the INCRUSE & let me know...  We gave you the 2017 FLU vaccine today...  Look into insurance coverage/ cost for the PREVNAR-13 & Pneumovax-23 pneumonia shots (you should get one of each separated by 46yr)...  Call for any questions...   Let's plan a follow up visit in 85mo, sooner if needed for problems.Marland KitchenMarland Kitchen

## 2016-11-26 NOTE — Telephone Encounter (Signed)
Message left

## 2016-11-26 NOTE — Telephone Encounter (Signed)
Telephone call for follow-up, PTH is now on the normal range is going to proceed with Prolia. Since they have started with a prior authorization at the endocrinologist office will have that prior authorization faxed to our office when achieved, because prefers to have injection at our office. I instructed her to have a fax sent attention Pam

## 2016-11-27 ENCOUNTER — Encounter: Payer: Self-pay | Admitting: Pulmonary Disease

## 2016-11-27 DIAGNOSIS — D721 Eosinophilia, unspecified: Secondary | ICD-10-CM | POA: Insufficient documentation

## 2016-11-27 DIAGNOSIS — Z8669 Personal history of other diseases of the nervous system and sense organs: Secondary | ICD-10-CM | POA: Insufficient documentation

## 2016-11-27 NOTE — Telephone Encounter (Signed)
PC to pt. Left Vm  Insurance benefits No deduc, co ins 20% ( $420 )  Co pay with or without OV $50 . approx total $470. OOPM $4400 (50 met). Calcium 9.3  11/21/16 pt due 10/03/16 recent refferal to Dr Cruzita Lederer for elevated labs.OK given for Prolia. Complete exam with [facility] 10/09/16

## 2016-11-27 NOTE — Progress Notes (Signed)
Subjective:     Patient ID: [name] [name], female   DOB: 1946-06-06, 71 y.o.   MRN: [id number]  HPI    71 y/o WF, ex-smoker w/ a hx of asthma & GOLD Stage 2 COPD, referred by DrTeoh for a pre-op respiratory evaluation...   ~  December 24, 2015:  Initial pulmonary consult by SN>      Her PCP is Dr. Sandi [name] at [facility])-- followed for AR/ Asthma/ COPD, HBP, HL, Hypothyroid, GERD, Hx kidney cancer- s/p left nephrectomy, Anxiety/Depression...    71 y/o WF referred by DrTeoh, ENT for pre-op pulmonary clearance for planned ENT surg (see below)>     [name] [name] has a min smoking hx & quit in 1984; she was prev treated by DrMcKeown on Symbicort160-2spBid, then moved to [place], then back to [place] and her new PCP DrSun gave her Memory Dance & she really likes this med but they are all >$300 on her insurance; she notes min cough, small amt clear sput, DOE w/ exertion but does ok w/ usual activities and ADLs; she had a bronchitic exac 10/2015 w/ some discolored sput & streaky hemoptysis & was seen in the ER-- Symbicort & Ventolin were listed as her meds, Chest was clear on exam, CXR was clear/NAD and LABS were also wnl including D-dimer; Given NEB rx and antibiotic & improved; she noted some SOB but mostly because she couldn't breathe thru her nose=> referred to DrTeoh, ENT...    She saw DrCDYoung yrs ago- last 2009 & avail records indicate hx Asthma w/ COPD & mult exac treated w/ Pred etc; she had DOE & faint wheezing on exam; Hx GERD treated by DrPerry    She saw DrTeoh w/ facial pressure & nasal obstruction- eval w/ CT Sinus & flex endoscopy revealed bilat nasal polyps, deviated septum, bilat inferior turbinate hypertrophy, & a large right concha bullosa;  He plans surg for all these findings and requests pre-op pulmonary eval...     Note: there is a mention of "mild to mod OSA" diagnosed on a sleep study in 2015 & a letter from [facility] for an oral sleep appliance; none of the original data is avail to [place] &  the pt never received any treatment for OSA...  Smoking Hx>  Ex-smoker, smoked for ~10 yrs, up to 1/2 PPD, quit smoking in 1984 for ~5pack-yr smoking hx...  Pulmonary Hx>  She sees DrSharma for Allergy (we do not have his records); told to have asthma and COPD in the past; said to have OSA as well (we do not have any of this data).  Medical Hx>  HBP, HL, Hypothyroid, GERD, Hx kidney cancer- s/p left nephrectomy, Anxiety/Depression  Family Hx>  Father had lung cancer, Mother had asthma; her [place] were [place].  Occup Hx>  No known exposure to asbestos or any toxins.  Current Meds>  Xyzal5, VentolinHFA, Breo, Liberal, Apres25, Protonix40, Synthroid100, Prolia, Alpraz0.5, Lexapro20  EXAM shows Afeb, VSS, O2sat=96% onRA;  HEENT- nasal obstruction/ congestion, mallampati3;  Chest- few bibasilar rhonchi w/o w/r/consolidation;  Heart- RR w/o m/r/g;  Abd- soft, nontender, neg;  Ext- w/o c/c/e;  Neuro- intact...  Last CXR in Epic was 11/08/15>  Norm heart size, clear sl hyperexpanded lungs, mild scoliosis, NAD...  Spirometry 12/24/15>  FVC=2.40 (79%), FEV1=1.25 (54%), %1sec=52, mid-flows reduced at 32% predicted... c/w moderate airflow obstruction and GOLD Stage 2 COPD...  Ambulatory oximetry 12/24/15>  O2sat=100% on RA at rest w/ pulse=69/min;  She ambulated 3  Laps in the office w/ lowest O2say=98% w/ pulse=97/min... No desaturations...  IMP >>     1) COPD-- mod persistent, chronic bronchitic type, with a reversible component     2) Hx allergic rhinitis-- treated by DrSharma, we do not have his notes    3) Hx OSA-- sleep study and rx per DrMcKeown in 2015, we do not have any of this data    4) ENT-- per DrTeoh w/ facial pressure & nasal obstruction- eval w/ CT Sinus & flex endoscopy revealed bilat nasal polyps, deviated septum, bilat inferior turbinate hypertrophy, & a large right concha bullosa    5) Medical issues-- per DrSun w/ HBP, HL, Hypothyroid, GERD, Hx kidney cancer-  s/p left nephrectomy, osteopenia, Anxiety/Depression PLAN >>     [name] has done well on the Breo one inhalation daily but the $300/mo is prohibitive; we discussed trial of NEBULIZED medications to see if they work as well and are less expensive; In this regard- start DUONEB BID via Nebulizer and BUDESONIDE 0.25 BID via Nebulizer; she will continue to use the VentolinHFA as needed and her other meds as listed... we plan an ROV recheck in 33mo sooner if needed for acute problems, she is OK for surg...  ~  March 17, 2016:  310moOV w/ SN>  FrManus Gunningeports that she had her sinus surg by DrTeoh (polyps removed, inferior nasal turbinates reduced, and dev septum repaired) and she is able to breathe thru her nose much better & as a result she feels that her breathing overall is much improved;  When she was here 3/13- we tried to improve her meds/ costs by changing her Breo/ Incruse to NEBS w/ Duoneb Bid & Budes Bid, but she notes that this wasn't holding her as well (prob due to the shorter acting B-agonist & anticholinergic in Duoneb);  I have rec to pt that she can increase the Duoneb med to Qid if needed, and continue the Budesonide Bid- she prefers this to Breo/ Incruse to to the cost of the latter inhalers... We reviewed the following medical problems during today's office visit >>     COPD/ AB-- mod persistent, chronic bronchitic type, with a reversible component; FEV1 12/2015= 1.25 (54%); she quit smoking 1984 & has only a 5 pack-yr smoking hx; due to cost issues we have tried to optimize her therapy w/ min expense & rec NEBS w/ DUONEB Qid and BUDESONIDE 0.25Bid; she has a PrProgramme researcher, broadcasting/film/videonhaler for prn use when out & about...    Hx allergic rhinitis-- treated by DrSharma, we do not have his notes.    Hx OSA-- sleep study and rx per DrMcKeown in 2015, we do not have any of this data.    ENT-- per DrTeoh w/ facial pressure & nasal obstruction- eval w/ CT Sinus & flex endoscopy revealed bilat nasal polyps, deviated  septum, bilat inferior turbinate hypertrophy, & a large right concha bullosa => s/p endoscopic sinus surg 01/2016 and she reports much improved & able to breathe thru her nose...     Medical issues-- per DrSun w/ HBP, HL, Hypothyroid, GERD, Hx kidney cancer- s/p left nephrectomy, osteopenia, Anxiety/Depression EXAM shows Afeb, VSS, O2sat=97% onRA;  HEENT- imroved nasal airway s/p surg, mallampati3;  Chest- clear w/o w/r/r;  Heart- RR w/o m/r/g;  Abd- soft, nontender, neg;  Ext- w/o c/c/e;  Neuro- intact... IMP/PLAN>>  Problems as noted, she is improved overall but the Bid NEB sched is not working long enough- she still prefers the NEBs over  the inhalers due to cost; Rec to incr Duoneb to Qid, continue Budesonide Bid;  SHE NEEDS A NEW SMALLER PORTABLE NEB UNIT=> we will request thru her DME company;  She will try to get [place] sleep apnea data (sleep study date & results, machine info & download data;  We plan ROV in 44mo sooner if needed prn...  ~  September 16, 2016:  622moOV & Fran reports that she has had mult flair-ups in her AB, frequently requiring antibiotics and Pred from her PCP, DrSun;  She tells me that she still feels that the NEBULIZER meds (Duoneb + Budes) makes her worse (ie- breathing worse but she can't elaborate for me what she means) and that she is better when she backs off the neb rx;  She notes that she likes the BREO + INCRUSE better & says she does fine on these meds but they are too $$ in the donut hole;  Her CC today is recurrent nasal congestion & thick green nasal drainage- this has drained to her throat as well producing increased cough & mild wheezing... We reviewed the following medical problems during today's office visit >>     Asthma/ AB/ & COPD based on min smoking hx & what sounds like long-term poor airway control> mod persistent, chronic bronchitic type, with a reversible component; FEV1 12/2015= 1.25 (54%); she quit smoking 1984 & has only a 5 pack-yr smoking hx; due to cost  issues we have tried to optimize her therapy w/ min expense & tried NEBS w/ DUONEB Qid and BUDESONIDE 0.25Bid but she claims the nebulizer makes her breathing worse; she feels that she does well on BREO + INCRUSE but cost is an issue in the donut hole, where she uses her rescue inhale too often; +FamHx- mother has Asthma on Xolair, Father died from lung cancer... We discussed further eval w/ IgE/ RAST/ CBC w/ diff to check eos & she if she is a candidate for a biological...    Hx allergic rhinitis> eval by DrSharma, we do not have his notes, but pt reports that allergy testing was NEG, despite hx nasal polyposis etc...    Hx OSA> sleep study and rx per DrMcKeown in 2015, we do not have any of this data avail to review...    ENT> per DrTeoh w/ facial pressure & nasal obstruction- eval w/ CT Sinus & flex endoscopy revealed bilat nasal polyps, deviated septum, bilat inferior turbinate hypertrophy, & a large right concha bullosa => s/p endoscopic sinus surg 01/2016 and she reports much improved & able to breathe thru her nose for awhile until her congestion returned; Rec to resume her nasal irrigations.....Marland KitchenMarland KitchenMarland Kitchen   Medical issues> per DrSun w/ HBP, HL, Hypothyroid, GERD, Hx kidney cancer- s/p left nephrectomy, prob secondary hyperpara, osteoporosis (DrFernandez), Anxiety/Depression... EXAM shows Afeb, VSS, O2sat=95% onRA;  HEENT- recurrent nasal congestion, mallampati3;  Chest- mild end exp wheezing L>R w/o rales/ rhonchi/ consolidation;  Heart- RR w/o m/r/g;  Abd- soft, nontender, neg;  Ext- w/o c/c/e...   LABS 09/16/16>  IgE=83 (nl<115);  RAST= all NEG;  CBCw/diff shows Hg=14.8, WBC=9.1 w/ 17% eos... IMP/PLAN>>  We discussed her dilemma and decided on RX w/ BREO- one inhalation daily, INCRUSE- one inhalation daily, ProairHFA rescue inhaler vs NEB w/ Duoneb as needed;  She will also start Rx w/ her nasal saline irrigations (per DrTeoh), and MUCINEX '600mg'$  Qid w/ fluids;  For her acute prob we will Rx w/ Depo80 +  MEDROL Dosepak (she doesn't like  Pred);  Finally we will pursue further eval w/ Ige/RAST testing & check a CBC w/ diff (off Pred x several months now) to check EOS & consider a biological if indicated... we plan ROV recheck in 6-8wks.  NOTE> IgE/RAST are NEG but CBC w/ 17% eos, she would be a candidate for a biological eg- Ronne Binning & we will discuss on ret in Jan...   ~  November 26, 2016:  51mo ROV & Drenda Freeze reports a good response to the MEDROL, Mucinex, nasal irrigations + her regular BREO, INCRUSE, ProairHFA;  When last seen her additional Asthma work-up showed IgE=83 (nl<115);  RAST= all NEG and CBC w/diff shows Hg=14.8, WBC=9.1 w/ 17% eos;  We discussed the poss of a biologic for her hypereosinophilia Virginia Crews, [place], [place]) but she feels so good now/ verry satisfied w/ her breathing/ doesn't want additional therapy at this juncture...  OK 2017 flu vaccine today, and she needs Prevnar-13 & another Pneumovax-23 but wants to check w/ insurance first... SEE PROB LIST ABOVE... EXAM shows Afeb, VSS, O2sat=97% onRA;  HEENT- decreased nasal congestion, mallampati3;  Chest- neg, clear w/o wheezing/ rales/ rhonchi/ consolidation;  Heart- RR w/o m/r/g;  Abd- soft, nontender, neg;  Ext- w/o c/c/e...   LABS 09/16/16>  IgE=83 (nl<115);  RAST= all NEG;  CBCw/diff shows Hg=14.8, WBC=9.1 w/ 17% eos... IMP/PLAN>>  Drenda Freeze is much improved after Medrol given 02/2016; she has maintained Breo/ Incruse &remains stable at this time therefore does NOT want to consider a biologic for her asthma (+candidate for Nucala etc);  rec to continue same meds, ok flu vaccine today...     Past Medical History:  Diagnosis Date  . Allergy   . Anxiety   . Asthma   . Cancer (HCC)    Kidney cancer-Hypernephroma  . Cervical dysplasia   . COPD (chronic obstructive pulmonary disease) (HCC)   . Depression   . Elevated cholesterol   . GERD (gastroesophageal reflux disease)   . Glaucoma   . Hypertension   . Lymphocytic  colitis   . Osteoporosis 08/2015   T score -3.2  . Serum calcium elevated     Past Surgical History:  Procedure Laterality Date  . ABDOMINAL HYSTERECTOMY  1989   TAH LSO  . AUGMENTATION MAMMAPLASTY     Implants removed  . COLPOSCOPY    . COSMETIC SURGERY    . EYE SURGERY  2017  . KIDNEY REMOVED  2002   Left  . NOSE SURGERY  2017  . OOPHORECTOMY     LSO  . TONSILLECTOMY      Outpatient Encounter Prescriptions as of 09/16/2016  Medication Sig  . albuterol (PROAIR HFA) 108 (90 Base) MCG/ACT inhaler Inhale 1-2 puffs into the lungs every 4 (four) hours as needed for wheezing or shortness of breath.  . ALPRAZolam (XANAX) 0.5 MG tablet Take 0.5-1 mg by mouth at bedtime as needed. Sleep.  Marland Kitchen amLODipine (NORVASC) 5 MG tablet TAKE 1/2 TO 1 TABLET DAILY FOR BLOOD PRESSURE  . brimonidine (ALPHAGAN P) 0.1 % SOLN Place 1 drop into both eyes 2 (two) times daily.  Marland Kitchen co-enzyme Q-10 50 MG capsule Take 50 mg by mouth daily.  Marland Kitchen escitalopram (LEXAPRO) 20 MG tablet Take 20 mg by mouth daily.  . fluticasone furoate-vilanterol (BREO ELLIPTA) 200-25 MCG/INH AEPB Inhale 1 puff into the lungs daily.  . INCRUSE ELLIPTA 62.5 MCG/INH AEPB Inhale 1 puff into the lungs daily.  Marland Kitchen ipratropium-albuterol (DUONEB) 0.5-2.5 (3) MG/3ML SOLN Take 3 mLs by nebulization  4 (four) times daily.  Marland Kitchen levocetirizine (XYZAL) 5 MG tablet Take 1 tablet (5 mg total) by mouth daily.  Marland Kitchen levothyroxine (SYNTHROID, LEVOTHROID) 100 MCG tablet Take 100 mcg by mouth daily before breakfast.  . pantoprazole (PROTONIX) 40 MG tablet Take 40 mg by mouth daily.  . Probiotic Product (PROBIOTIC PO) Take 1 tablet by mouth at bedtime.   . budesonide (PULMICORT) 0.25 MG/2ML nebulizer solution Take 2 mLs (0.25 mg total) by nebulization 2 (two) times daily. (Patient not taking: Reported on 09/16/2016)  . [DISCONTINUED] BESIVANCE 0.6 % SUSP   . [DISCONTINUED] dorzolamide-timolol (COSOPT) 22.3-6.8 MG/ML ophthalmic solution Place 1 drop into the left eye 2  (two) times daily.  . [DISCONTINUED] hydrALAZINE (APRESOLINE) 25 MG tablet Take 25 mg by mouth 2 (two) times daily.   . [DISCONTINUED] HYDROcodone-acetaminophen (NORCO/VICODIN) 5-325 MG tablet Take 1 tablet by mouth every 4 (four) hours as needed. (Patient not taking: Reported on 09/16/2016)    Allergies  Allergen Reactions  . Iodine     Pt has only one kidney.  . Iohexol      Code: RASH, Desc: PT STATES SHE HAS ONE KIDNEY SO NOT TO USE IV DYE/10/27/06/RM, Onset Date: [id number]   . Penicillins     Has patient had a PCN reaction causing immediate rash, facial/tongue/throat swelling, SOB or lightheadedness with hypotension: (No) Has patient had a PCN reaction causing severe rash involving mucus membranes or skin necrosis: (No) Has patient had a PCN reaction that required hospitalization (No) Has patient had a PCN reaction occurring within the last 10 years: (No) Pt states she has historically tolerated PCN, but tested positive for allergy.   If all of the above answers are "NO", then may proce  . Sulfa Antibiotics Nausea Only    Immunization History  Administered Date(s) Administered  . Influenza Split 07/18/2015  . Influenza, High Dose Seasonal PF 11/26/2016  . Influenza,inj,Quad PF,36+ Mos 09/27/2013  . Influenza-Unspecified 07/13/2012  . Pneumococcal Polysaccharide-23 09/12/2006    Current Medications, Allergies, Past Medical History, Past Surgical History, Family History, and Social History were reviewed in [employer] record.   Review of Systems             All symptoms NEG except where BOLDED >>  Constitutional:  F/C/S, fatigue, anorexia, unexpected weight change. HEENT:  HA, visual changes, hearing loss, earache, nasal symptoms, sore throat, mouth sores, hoarseness. Resp:  cough, sputum, hemoptysis; SOB, tightness, wheezing. Cardio:  CP, palpit, DOE, orthopnea, edema. GI:  N/V/D/C, blood in stool; reflux, abd pain, distention, gas. GU:  dysuria,  freq, urgency, hematuria, flank pain, voiding difficulty. MS:  joint pain, swelling, tenderness, decr ROM; neck pain, back pain, etc. Neuro:  HA, tremors, seizures, dizziness, syncope, weakness, numbness, gait abn. Skin:  suspicious lesions or skin rash. Heme:  adenopathy, bruising, bleeding. Psyche:  confusion, agitation, sleep disturbance, hallucinations, anxiety, depression suicidal.   Objective:   Physical Exam       Vital Signs:  Reviewed...   General:  WD, WN, 71 y/o WF in NAD; alert & oriented; pleasant & cooperative... HEENT:  [place]/AT; Conjunctiva- pink, Sclera- nonicteric, EOM-wnl, PERRLA, EACs-clear, TMs-wnl; NOSE-congestede; THROAT-clear & wnl.  Neck:  Supple w/ fair ROM; no JVD; normal carotid impulses w/o bruits; no thyromegaly or nodules palpated; no lymphadenopathy.  Chest:  decr BS at bases w/ scat end-exp wheezing; no rales/ rhonchi/ or signs of consolidation... Heart:  Regular Rhythm; norm S1 & S2 without murmurs, rubs, or gallops detected. Abdomen:  Soft &  nontender- no guarding or rebound; normal bowel sounds; no organomegaly or masses palpated. Ext:  Normal ROM; without deformities or arthritic changes; no varicose veins, venous insuffic, or edema;  Pulses intact w/o bruits. Neuro:  No focal neuro deficits, sensory testing normal; gait normal & balance OK. Derm:  No lesions noted; no rash etc. Lymph:  No cervical, supraclavicular, axillary, or inguinal adenopathy palpated.   Assessment:      IMP >>    Asthma/ AB/ & COPD based on min smoking hx & what sounds like long-term poor airway control> mod persistent, chronic bronchitic type, with a reversible component; FEV1 12/2015= 1.25 (54%); she quit smoking 1984 & has only a 5 pack-yr smoking hx; due to cost issues we have tried to optimize her therapy w/ min expense & tried NEBS w/ DUONEB Qid and BUDESONIDE 0.25Bid but she claims the nebulizer makes her breathing worse; she feels that she does well on BREO + INCRUSE but  cost is an issue in the donut hole, where she uses her rescue inhale too often...    Hx allergic rhinitis> eval by DrSharma, we do not have his notes, but pt reports that allergy testing was NEG, despite hx nasal polyposis etc...    Hx OSA> sleep study and rx per DrMcKeown in 2015, we do not have any of this data avail to review...    ENT> per DrTeoh w/ facial pressure & nasal obstruction- eval w/ CT Sinus & flex endoscopy revealed bilat nasal polyps, deviated septum, bilat inferior turbinate hypertrophy, & a large right concha bullosa => s/p endoscopic sinus surg 01/2016 and she reports much improved & able to breathe thru her nose for awhile until her congestion returned; Rec to resume her nasal irrigations....    Medical issues> per DrSun w/ HBP, HL, Hypothyroid, GERD, Hx kidney cancer- s/p left nephrectomy, prob secondary hyperpara, osteoporosis (DrFernandez), Anxiety/Depression...  PLAN >>  12/24/15>   Manus Gunning has done well on the [facility] one inhalation daily but the $300/mo is prohibitive; we discussed trial of NEBULIZED medications to see if they work as well and are less expensive; In this regard- start DUONEB BID via Nebulizer and BUDESONIDE 0.25 BID via Nebulizer; she will continue to use the VentolinHFA as needed and her other meds as listed... 03/17/16>    Problems as noted, she is improved overall but the Bid NEB sched is not working long enough- she still prefers the NEBs over the inhalers due to cost; Rec to incr Duoneb to Qid, continue Budesonide Bid;  SHE NEEDS A NEW SMALLER PORTABLE NEB UNIT=> we will request thru her DME company;  She will try to get [place] sleep apnea data (sleep study date & results, machine info & download data;  We plan ROV in 42mo sooner if needed prn... 09/16/16>    We discussed her dilemma (freq exac & ?worse w/ neb rx) and decided on RX w/ BREO- one inhalation daily, INCRUSE- one inhalation daily, ProairHFA rescue inhaler vs NEB w/ Duoneb as needed;  She will also start Rx w/  her nasal saline irrigations (per DrTeoh), and MUCINEX '600mg'$  Qid w/ fluids;  For her acute prob we will Rx w/ Depo80 + MEDROL Dosepak (she doesn't like Pred);  Finally we will pursue further eval w/ IgE/RAST testing & check a CBC w/ diff (off Pred x several months now) to check EOS & consider a biological if indicated. 11/26/16>   FManus Gunningis much improved after Medrol given 02/2016; she has maintained Breo/ Incruse &remains stable  at this time therefore does NOT want to consider a biologic for her asthma (+candidate for Nucala etc);  rec to continue same meds, ok flu vaccine today...      Plan:     Patient's Medications  New Prescriptions   No medications on file  Previous Medications   ALBUTEROL (PROAIR HFA) 108 (90 BASE) MCG/ACT INHALER    Inhale 1-2 puffs into the lungs every 4 (four) hours as needed for wheezing or shortness of breath.   ALBUTEROL SULFATE (PROAIR RESPICLICK) 482 (90 BASE) MCG/ACT AEPB    Inhale 1-2 puffs into the lungs every 6 (six) hours as needed.   ALPRAZOLAM (XANAX) 0.5 MG TABLET    Take 0.5-1 mg by mouth at bedtime as needed. Sleep.   AMLODIPINE (NORVASC) 5 MG TABLET    TAKE 1/2 TO 1 TABLET DAILY FOR BLOOD PRESSURE   BRIMONIDINE (ALPHAGAN) 0.2 % OPHTHALMIC SOLUTION       BUDESONIDE (PULMICORT) 0.25 MG/2ML NEBULIZER SOLUTION    Take 2 mLs (0.25 mg total) by nebulization 2 (two) times daily.   CHOLECALCIFEROL (HM VITAMIN D3) 4000 UNITS CAPS    Take 1 capsule by mouth daily.   CO-ENZYME Q-10 50 MG CAPSULE    Take 50 mg by mouth daily.   ESCITALOPRAM (LEXAPRO) 20 MG TABLET    Take 20 mg by mouth daily.   FLUTICASONE FUROATE-VILANTEROL (BREO ELLIPTA) 200-25 MCG/INH AEPB    Inhale 1 puff into the lungs daily.   INCRUSE ELLIPTA 62.5 MCG/INH AEPB    Inhale 1 puff into the lungs daily.   IPRATROPIUM-ALBUTEROL (DUONEB) 0.5-2.5 (3) MG/3ML SOLN    Take 3 mLs by nebulization 4 (four) times daily.   LEVOCETIRIZINE (XYZAL) 5 MG TABLET    Take 1 tablet (5 mg total) by mouth daily.    LEVOTHYROXINE (SYNTHROID, LEVOTHROID) 100 MCG TABLET    Take 100 mcg by mouth daily before breakfast.   PANTOPRAZOLE (PROTONIX) 40 MG TABLET    Take 40 mg by mouth daily.   PROBIOTIC PRODUCT (PROBIOTIC PO)    Take 1 tablet by mouth at bedtime.   Modified Medications   No medications on file  Discontinued Medications   CHOLECALCIFEROL (VITAMIN D3) 3000 UNITS TABS    Take by mouth.

## 2016-11-28 ENCOUNTER — Encounter: Payer: Self-pay | Admitting: Gynecology

## 2016-12-01 NOTE — Telephone Encounter (Signed)
Pc from pt and from insurance company regarding Prolia co pay and co insurance. Will resend to [employer] for insurance.

## 2016-12-02 MED ORDER — DENOSUMAB 60 MG/ML ~~LOC~~ SOLN: 60.0000 mg | Freq: Once | SUBCUTANEOUS | 0 refills | Status: AC

## 2016-12-02 NOTE — Telephone Encounter (Signed)
Detailed PC from pt concerned about the information from [facility], stated they would not fill RX Prolia, I explained the process to her. I will re file her RX to Prolia assistance for benefits check thru [employer].

## 2016-12-02 NOTE — Telephone Encounter (Signed)
Prolia prescription sent to pt's pharmacy as requested. Her pharmacy will let her know the cost of medication. She will call here to schedule injection of med. KW CMA

## 2016-12-03 NOTE — Telephone Encounter (Signed)
LM for pt refiled insurance all information under referral MD [place] waiting on correction.

## 2016-12-04 NOTE — Telephone Encounter (Signed)
[facility] to [facility] [id number] one syringe 60 units with one refill.  Inject one syringe every six months. NCD [id number]  . Prolia will be mailed to office pending OK from pharmacy and pre paid by patient

## 2016-12-08 ENCOUNTER — Encounter: Payer: Self-pay | Admitting: Gynecology

## 2016-12-08 NOTE — Telephone Encounter (Signed)
PC to pt  And explained her cost from mail order pharmacy  Deductible [phone] with a co pay $275 (90day supply due to 6 month meds), total $370 she will order and have shipped to office. Pt also understands cost of office.  She will decide and call back after talking to husband.

## 2016-12-15 ENCOUNTER — Telehealth: Payer: Self-pay | Admitting: Internal Medicine

## 2016-12-15 ENCOUNTER — Telehealth: Payer: Self-pay

## 2016-12-15 ENCOUNTER — Telehealth: Payer: Self-pay | Admitting: *Deleted

## 2016-12-15 NOTE — Telephone Encounter (Signed)
Patient is calling on the status of her prolia injection,she will let Dr Phineas Real from [facility] give her prolia shot. Until all this is resolved.

## 2016-12-15 NOTE — Telephone Encounter (Signed)
Talked with patient  In consultation with insurance [facility] Medicare she will call when she has information.

## 2016-12-15 NOTE — Telephone Encounter (Signed)
Called and LVM advising patient of how much prolia injection would be. Advised patient to call back to either schedule or let us know if she could not afford it. Gave call back number.

## 2016-12-15 NOTE — Telephone Encounter (Signed)
Verification of benefits have been processed and an approval has been received for pts prolia injection. Pts estimated cost are appx $260. This is only an estimate and cannot be confirmed until benefits are paid. Please advise pt and schedule if needed. If scheduled, once the injection is received, pls contact me back with the date it was received so that I am able to update prolia folder. thanks  

## 2016-12-15 NOTE — Telephone Encounter (Signed)
Returned phone call, regarding prolia, to discuss what will do. Gave call back number.

## 2016-12-15 NOTE — Telephone Encounter (Signed)
Patient returned phone call regarding prolia, states she has had so many problems getting the injection and Dr.Fontaine's GYN office has been dealing with this and patient would like for them to continue to deal with it. If she receives injection she will let us know. Thank you!

## 2016-12-15 NOTE — Telephone Encounter (Signed)
Returned patient call, LVM for her to call back to discuss her plans on what to do. Thank you!

## 2017-01-02 ENCOUNTER — Telehealth: Payer: Self-pay | Admitting: Pulmonary Disease

## 2017-01-02 NOTE — Telephone Encounter (Signed)
Per SN- Ok to take the zyrtec even with 1 kidney, if she is concerned she can take 1/2 pill.  lmtcb X1 for pt.

## 2017-01-02 NOTE — Telephone Encounter (Signed)
Pt returning call and says it's ok to leave a message.[name] [name]

## 2017-01-02 NOTE — Telephone Encounter (Signed)
Dr. Lenna Gilford  Please Advise  Spoke with pt's pharmacy and they stated levocetirizine is on back order 3 months and they are unsure when they will get this medication. Pt wanted to know if you can prescribe something else.

## 2017-01-02 NOTE — Telephone Encounter (Signed)
Per SN- try otc Zyrtec 10mg  1 po qd #30 with 6 ref.  Spoke with pt, aware of recs.  States that she is concerned with taking an otc antihistamine d/t having one kidney- pt is requesting SN's clearance with this before taking this medication.  SN please advise.  Thanks.

## 2017-01-02 NOTE — Telephone Encounter (Signed)
Left detailed message at pt request.  Nothing further needed.

## 2017-01-07 NOTE — Telephone Encounter (Signed)
Left VM asked pt to call and let me know if she will continue Prolia.

## 2017-01-12 NOTE — Telephone Encounter (Signed)
VM from patient, she is not able to pay the $370 cost thru the mail order pharmacy at this time for Prolia. She will not receive Prolia for 2018. She has changed insurance plans in 2018 and the coverage has changed for her. I will forward this information to Elon Alas, NP her provider

## 2017-01-14 ENCOUNTER — Telehealth: Payer: Self-pay | Admitting: Gynecology

## 2017-01-14 NOTE — Telephone Encounter (Addendum)
VM from pt she states that Prolia needs to be co pay $190 , she states that no refill should be made and her second injection should be $95 We will re file again with Prolia checking benefits. Verified with Prolia that benefits are correct. I will ok with N Young to call in Prolia one syringe with no refill. [place] ok'd.  Rx called to [place] 1 [phone]

## 2017-01-19 NOTE — Telephone Encounter (Signed)
VM to pt asked to call me with her decision about Prolia.

## 2017-02-04 NOTE — Telephone Encounter (Signed)
PC from pt she will not continue Prolia due to financial reasons. She would like to come in for consult with Elon Alas to discuss oral medication.

## 2017-02-06 LAB — BASIC METABOLIC PANEL: Creatinine: 1 (ref <=1.1)

## 2017-02-10 ENCOUNTER — Ambulatory Visit (INDEPENDENT_AMBULATORY_CARE_PROVIDER_SITE_OTHER): Payer: Medicare Other | Admitting: Women's Health

## 2017-02-10 ENCOUNTER — Encounter: Payer: Self-pay | Admitting: Women's Health

## 2017-02-10 ENCOUNTER — Telehealth: Payer: Self-pay | Admitting: Gynecology

## 2017-02-10 VITALS — BP 122/78 | Ht 66.0 in | Wt 162.0 lb

## 2017-02-10 DIAGNOSIS — M810 Age-related osteoporosis without current pathological fracture: Secondary | ICD-10-CM | POA: Diagnosis not present

## 2017-02-10 NOTE — Progress Notes (Signed)
Presents to discuss osteoporosis treatment options. Has received several doses of Prolia with no side effects but now insurance not covering. 08/2015 T score left femoral neck -3.2. No fracture history. TAH on no HRTand 2002 nephrectomy. Also history of GERD making oral bisphosphonates problematic.   Exam: Appears well. Husband accompanied.  Osteoporosis treatment options  Plan: Reclast reviewed, trial of bisphosphonates worrisome due to history of nephrectomy and GERD. Would like to proceed with Reclast. Reviewed common side effects of flulike symptoms myalgia necessitating Tylenol prior. Will check coverage. Reviewed importance of weightbearing exercise, fall prevention. Has recently joined a gym and does plan to start going also encouraged yoga for balance.

## 2017-02-10 NOTE — Patient Instructions (Addendum)
Ibandronate monthly tablets What is this medicine? IBANDRONATE (i BAN droh nate) slows calcium loss from bones. It is used to treat osteoporosis in women past the age of menopause. This medicine may be used for other purposes; ask your health care provider or pharmacist if you have questions. COMMON BRAND NAME(S): Boniva What should I tell my health care provider before I take this medicine? They need to know if you have any of these conditions: -dental disease -esophageal, stomach, or intestine problems, like acid reflux or GERD -kidney disease -low blood calcium -low vitamin D -problems sitting or standing for 60 minutes -trouble swallowing -an unusual or allergic reaction to ibandronate, other medicines, foods, dyes, or preservatives -pregnant or trying to get pregnant -breast-feeding How should I use this medicine? You must take this medicine exactly as directed or you will lower the amount of medicine you absorb into your body or you may cause yourself harm. Take your dose by mouth first thing in the morning, after you are up for the day. Do not eat or drink anything before you take this medicine. Swallow the tablet with a full glass (6 to 8 ounces) of plain water. Do not take this medicine with any other drink. Do not chew or crush the tablet. After taking this medicine, do not eat breakfast, drink, or take any other medicines or vitamins for at least 1 hour. Stand or sit up for at least 1 hour after taking this medicine. Do not lie down. Take this medicine on the same day every month. Do not take your medicine more often than directed. Talk to your pediatrician regarding the use of this medicine in children. Special care may be needed. Overdosage: If you think you have taken too much of this medicine contact a poison control center or emergency room at once. NOTE: This medicine is only for you. Do not share this medicine with others. What if I miss a dose? If you miss a dose and your  next dose is more than 7 days away then take the missed dose on the next morning you remember. Then take your next dose on your regular day of the month. If your next dose is due within the next 7 days then skip the missed dose. Do not take 2 tablets within 1 week of each other. Do not take double or extra doses. What may interact with this medicine? -aluminum hydroxide -antacids -aspirin -calcium supplements -drugs for inflammation like ibuprofen, naproxen, and others -iron supplements -magnesium supplements -vitamins with minerals This list may not describe all possible interactions. Give your health care provider a list of all the medicines, herbs, non-prescription drugs, or dietary supplements you use. Also tell them if you smoke, drink alcohol, or use illegal drugs. Some items may interact with your medicine. What should I watch for while using this medicine? Visit your doctor or health care professional for regular check ups. It may be some time before you see the benefit from this medicine. Do not stop taking your medicine unless your doctor tells you to. Your doctor may order blood tests and other tests to see how you are doing. You should make sure that you get enough calcium and vitamin D while you are taking this medicine. Discuss the foods you eat and the vitamins you take with your health care professional. Some people who take this medicine have severe bone, joint, and/or muscle pain. This medicine may also increase your risk for a broken thigh bone. Tell your doctor right  away if you have pain in your upper leg or groin. Tell your doctor if you have any pain that does not go away or that gets worse. What side effects may I notice from receiving this medicine? Side effects that you should report to your doctor or health care professional as soon as possible: -allergic reactions such as skin rash or itching, hives, swelling of the face, lips, throat or tongue -black or tarry  stools -change in vision -chest pain -heartburn or stomach pain -jaw pain, especially after dental work -redness, blistering, peeling, or loosening of the skin, including inside the mouth -trouble or pain when swallowing Side effects that usually do not require medical attention (report to your doctor or health care professional if they continue or are bothersome): -bone, muscle or joint pain -changes in taste -diarrhea or constipation -headache -nausea or vomiting -stomach gas or fullness This list may not describe all possible side effects. Call your doctor for medical advice about side effects. You may report side effects to FDA at 1-800-FDA-1088. Where should I keep my medicine? Keep out of the reach of children. Store at room temperature between 15 and 30 degrees C (59 and 86 degrees F). Throw away any unused medication after the expiration date. NOTE: This sheet is a summary. It may not cover all possible information. If you have questions about this medicine, talk to your doctor, pharmacist, or health care provider.  2018 Elsevier/Gold Standard (2011-03-28 09:03:09) Calcium Carbonate; Risedronate tablets What is this medicine? CALCIUM CARBONATE; RISEDRONATE (KAL see um KAR bon ate; ris ED roe nate) reduces calcium loss from bones. It is used to prevent and to treat osteoporosis. This medicine may be used for other purposes; ask your health care provider or pharmacist if you have questions. COMMON BRAND NAME(S): Actonel with Calcium What should I tell my health care provider before I take this medicine? They need to know if you have any of these conditions: -dental disease -esophageal, stomach, or intestinal problems, like acid reflux or GERD -hyperparathyroidism -kidney stones or disease -low or high blood calcium -problems sitting or standing for 30 minutes -trouble swallowing -an unusual or allergic reaction to calcium, risedronate, other bisphosphonates or medicines,  foods, dyes, or preservatives -pregnant or trying to get pregnant -breast-feeding How should I use this medicine? Follow the directions on the prescription label. Take risedronate on the same day of the week, every week. Take calcium on the other 6 days of the week. On the day you take the risedronate: You must take this medicine exactly as directly or you will lower the amount of the medicine that you absorb into your body or you may cause yourself harm. Take this medicine by mouth first thing in the morning, after you are up for the day. Do not eat or drink anything before you take this medicine. Swallow the tablet with a full glass (6 to 8 ounces) of plain water. Do not take this medicine with any other drink. Do not chew or crush the tablet. After taking this medicine, do not eat breakfast, drink, or take any other medicines or vitamins for at least 30 minutes. Stand or sit up for at least 30 minutes after you take this medicine; do not lie down. Take this medicine on the same day every week. Do not take your medicine more often than directed. On the days you take the calcium: Take your calcium tablet with food on the 6 days of the week that you do not take risedronate.  Do not take other medicines at the same time as your calcium. Talk to your pediatrician regarding the use of this medicine in children. Special care may be needed. Overdosage: If you think you have taken too much of this medicine contact a poison control center or emergency room at once. NOTE: This medicine is only for you. Do not share this medicine with others. What if I miss a dose? If you miss a dose of risedronate, take the dose on the morning after you remember. Then take your next dose on your regular scheduled day of the week. Never take 2 tablets on the same day. Do not take double or extra doses. If you miss a dose of calcium, take it as soon as you remember with your next meal. Do not take 2 tablets with the same  meal. Do not take calcium and risedronate tablets at the same time. What may interact with this medicine? Do not take this medicine with any of the following medications: -ammonium chloride -methenamine This medicine may also interact with the following medications: -antacids like aluminum hydroxide or magnesium hydroxide -antibiotics like ciprofloxacin, tetracycline and others -aspirin -calcium supplements -drugs for inflammation like ibuprofen, naproxen, and others -iron supplements -magnesium supplements -NSAIDs, medicines for pain and inflammation, like ibuprofen or naproxen -steroid medicines like prednisone or cortisone -thiazide diuretics -thyroid hormones -vitamins with minerals This list may not describe all possible interactions. Give your health care provider a list of all the medicines, herbs, non-prescription drugs, or dietary supplements you use. Also tell them if you smoke, drink alcohol, or use illegal drugs. Some items may interact with your medicine. What should I watch for while using this medicine? Visit your doctor or health care professional for regular check ups. It may be some time before you see the benefit from this medicine. Your doctor may order blood tests or other tests to see how you are doing. You should make sure that you get enough calcium and vitamin D while you are taking this medicine. Discuss the foods you eat and the vitamins you take with your health care professional. Some people who take this medicine have severe bone, joint, and/or muscle pain. This medicine may also increase your risk for a broken thigh bone. Tell your doctor right away if you have pain in your upper leg or groin. Tell your doctor if you have any pain that does not go away or that gets worse. What side effects may I notice from receiving this medicine? Side effects that you should report to your doctor or health care professional as soon as possible: -allergic reactions like skin  rash, itching or hives, swelling of the face, lips, or tongue -breathing problems -black or tarry stools -changes in vision -heartburn or stomach pain -jaw pain, especially after dental work -pain or difficulty when swallowing -redness, blistering, peeling, or loosening of the skin, including inside the mouth -unusually weak or tired Side effects that usually do not require medical attention (report to your doctor or health care professional if they continue or are bothersome): -bone, muscle, or joint pain -changes in taste -diarrhea or constipation -eye pain or itching -headache -nausea or vomiting -stomach gas or fullness This list may not describe all possible side effects. Call your doctor for medical advice about side effects. You may report side effects to FDA at 1-800-FDA-1088. Where should I keep my medicine? Keep out of the reach of children. Store at room temperature between 15 and 30 degrees C (59 and 86  degrees F). Throw away any unused medicine after the expiration date. NOTE: This sheet is a summary. It may not cover all possible information. If you have questions about this medicine, talk to your doctor, pharmacist, or health care provider.  2018 Elsevier/Gold Standard (2015-11-01 10:24:50) Zoledronic Acid injection (Paget's Disease, Osteoporosis) What is this medicine? ZOLEDRONIC ACID (ZOE le dron ik AS id) lowers the amount of calcium loss from bone. It is used to treat Paget's disease and osteoporosis in women. This medicine may be used for other purposes; ask your health care provider or pharmacist if you have questions. COMMON BRAND NAME(S): Reclast, Zometa What should I tell my health care provider before I take this medicine? They need to know if you have any of these conditions: -aspirin-sensitive asthma -cancer, especially if you are receiving medicines used to treat cancer -dental disease or wear dentures -infection -kidney disease -low levels of calcium  in the blood -past surgery on the parathyroid gland or intestines -receiving corticosteroids like dexamethasone or prednisone -an unusual or allergic reaction to zoledronic acid, other medicines, foods, dyes, or preservatives -pregnant or trying to get pregnant -breast-feeding How should I use this medicine? This medicine is for infusion into a vein. It is given by a health care professional in a hospital or clinic setting. Talk to your pediatrician regarding the use of this medicine in children. This medicine is not approved for use in children. Overdosage: If you think you have taken too much of this medicine contact a poison control center or emergency room at once. NOTE: This medicine is only for you. Do not share this medicine with others. What if I miss a dose? It is important not to miss your dose. Call your doctor or health care professional if you are unable to keep an appointment. What may interact with this medicine? -certain antibiotics given by injection -NSAIDs, medicines for pain and inflammation, like ibuprofen or naproxen -some diuretics like bumetanide, furosemide -teriparatide This list may not describe all possible interactions. Give your health care provider a list of all the medicines, herbs, non-prescription drugs, or dietary supplements you use. Also tell them if you smoke, drink alcohol, or use illegal drugs. Some items may interact with your medicine. What should I watch for while using this medicine? Visit your doctor or health care professional for regular checkups. It may be some time before you see the benefit from this medicine. Do not stop taking your medicine unless your doctor tells you to. Your doctor may order blood tests or other tests to see how you are doing. Women should inform their doctor if they wish to become pregnant or think they might be pregnant. There is a potential for serious side effects to an unborn child. Talk to your health care professional  or pharmacist for more information. You should make sure that you get enough calcium and vitamin D while you are taking this medicine. Discuss the foods you eat and the vitamins you take with your health care professional. Some people who take this medicine have severe bone, joint, and/or muscle pain. This medicine may also increase your risk for jaw problems or a broken thigh bone. Tell your doctor right away if you have severe pain in your jaw, bones, joints, or muscles. Tell your doctor if you have any pain that does not go away or that gets worse. Tell your dentist and dental surgeon that you are taking this medicine. You should not have major dental surgery while on this medicine.  See your dentist to have a dental exam and fix any dental problems before starting this medicine. Take good care of your teeth while on this medicine. Make sure you see your dentist for regular follow-up appointments. What side effects may I notice from receiving this medicine? Side effects that you should report to your doctor or health care professional as soon as possible: -allergic reactions like skin rash, itching or hives, swelling of the face, lips, or tongue -anxiety, confusion, or depression -breathing problems -changes in vision -eye pain -feeling faint or lightheaded, falls -jaw pain, especially after dental work -mouth sores -muscle cramps, stiffness, or weakness -redness, blistering, peeling or loosening of the skin, including inside the mouth -trouble passing urine or change in the amount of urine Side effects that usually do not require medical attention (report to your doctor or health care professional if they continue or are bothersome): -bone, joint, or muscle pain -constipation -diarrhea -fever -hair loss -irritation at site where injected -loss of appetite -nausea, vomiting -stomach upset -trouble sleeping -trouble swallowing -weak or tired This list may not describe all possible side  effects. Call your doctor for medical advice about side effects. You may report side effects to FDA at 1-800-FDA-1088. Where should I keep my medicine? This drug is given in a hospital or clinic and will not be stored at home. NOTE: This sheet is a summary. It may not cover all possible information. If you have questions about this medicine, talk to your doctor, pharmacist, or health care provider.  2018 Elsevier/Gold Standard (2014-02-25 14:19:57)

## 2017-02-10 NOTE — Telephone Encounter (Signed)
Would like to proceed with Reclast, osteoporosis, left femoral neck -3.2 T score. Thanks, how do we proceed? Izora Gala

## 2017-02-17 NOTE — Telephone Encounter (Addendum)
Reclast approved , No PA needed. No deductible, OPM $4400  ($144.81 met).  [id number]  21747    M81.0. Total approx cost for pt $275. Reference # [id number].talked with Nira Conn. I called pt and left VM for her. I noted that pt only has one kidney . I will refer this to provider . I asked pt for PCP MD information regarding one kidney. I will send this note to Scherrie Gerlach NP Will not schedule pt for labs at this time until I receive approval from provider

## 2017-02-18 NOTE — Telephone Encounter (Signed)
PC from pt was seen by PCP Dr Nancy Fetter at [facility] extensive lab work. Urology MD Carrie Mew was seen one year ago. Will wait on the office to check Reclast decision.

## 2017-02-23 NOTE — Telephone Encounter (Signed)
Phone call to Manus Gunning, instructed her to have lab results faxed to our office. 2002 kidney cancer nephrectomy has had normal follow-ups with normal kidney function. Has been discharged from her urologist unless a problem occurs was last seen over a year and a half ago Dr. Ernst Bowler.

## 2017-02-24 NOTE — Telephone Encounter (Signed)
Labs from PCP hx renal cancer with nephrectomy . BUN 20  (7-25)   Creat 1.0 (0.7-1.1). Urine Creat 169.0  (20.0-3000.0)  Calcium 9.5  (8.8-10.6)  Creat clearance 61.0  ( 49.0-53.7) above normal [employer].   Izora Gala gave OK for Reclast infusion to be scheduled.

## 2017-02-24 NOTE — Telephone Encounter (Signed)
Reclast infusion at [facility] appointment made May 24/2018 at 11 am. Instructions will be mailed to patient.

## 2017-02-25 ENCOUNTER — Encounter: Payer: Self-pay | Admitting: Gynecology

## 2017-03-04 ENCOUNTER — Other Ambulatory Visit (HOSPITAL_COMMUNITY): Payer: Self-pay | Admitting: *Deleted

## 2017-03-05 ENCOUNTER — Ambulatory Visit (HOSPITAL_COMMUNITY)
Admission: RE | Admit: 2017-03-05 | Discharge: 2017-03-05 | Disposition: A | Discharge status: Home / Self Care | Payer: Medicare Other | Source: Ambulatory Visit | Attending: Pediatrics | Admitting: Pediatrics

## 2017-03-05 DIAGNOSIS — M810 Age-related osteoporosis without current pathological fracture: Secondary | ICD-10-CM | POA: Insufficient documentation

## 2017-03-05 MED ORDER — ZOLEDRONIC ACID 5 MG/100ML IV SOLN
INTRAVENOUS | Status: AC
  Administered 2017-03-05: 5 mg
  Filled 2017-03-05: qty 100

## 2017-03-05 MED ORDER — ZOLEDRONIC ACID 5 MG/100ML IV SOLN: 5.0000 mg | Freq: Once | INTRAVENOUS | Status: DC

## 2017-03-05 MED ORDER — SODIUM CHLORIDE 0.9 % IV SOLN
Freq: Once | INTRAVENOUS | Status: AC
  Administered 2017-03-05: 11:00:00 via INTRAVENOUS

## 2017-03-05 NOTE — Discharge Instructions (Signed)

## 2017-03-10 NOTE — Telephone Encounter (Signed)
Reclast 03/05/17  Next infusion 03/06/18 ( pt was currently using Prolia but due to insurance co pay was unable to continue)

## 2017-04-02 ENCOUNTER — Other Ambulatory Visit: Payer: Self-pay | Admitting: Plastic Surgery

## 2017-05-05 ENCOUNTER — Encounter: Payer: Self-pay | Admitting: Physician Assistant

## 2017-05-05 ENCOUNTER — Ambulatory Visit (INDEPENDENT_AMBULATORY_CARE_PROVIDER_SITE_OTHER): Payer: Medicare Other | Admitting: Physician Assistant

## 2017-05-05 VITALS — BP 116/75 | HR 61 | Temp 98.3°F | Resp 18 | Ht 66.0 in | Wt 171.4 lb

## 2017-05-05 DIAGNOSIS — I10 Essential (primary) hypertension: Secondary | ICD-10-CM | POA: Diagnosis not present

## 2017-05-05 DIAGNOSIS — Z23 Encounter for immunization: Secondary | ICD-10-CM

## 2017-05-05 DIAGNOSIS — F32A Depression, unspecified: Secondary | ICD-10-CM | POA: Insufficient documentation

## 2017-05-05 DIAGNOSIS — Z7689 Persons encountering health services in other specified circumstances: Secondary | ICD-10-CM | POA: Diagnosis not present

## 2017-05-05 DIAGNOSIS — F329 Major depressive disorder, single episode, unspecified: Secondary | ICD-10-CM

## 2017-05-05 DIAGNOSIS — Z905 Acquired absence of kidney: Secondary | ICD-10-CM | POA: Insufficient documentation

## 2017-05-05 MED ORDER — ALPRAZOLAM 0.5 MG PO TABS: 0.5000 mg | ORAL_TABLET | Freq: Every evening | ORAL | 0 refills | Status: DC | PRN

## 2017-05-05 MED ORDER — ZOSTER VAC RECOMB ADJUVANTED 50 MCG/0.5ML IM SUSR: 0.5000 mL | Freq: Once | INTRAMUSCULAR | 1 refills | Status: AC

## 2017-05-05 MED ORDER — AMLODIPINE BESYLATE 5 MG PO TABS: ORAL_TABLET | ORAL | 3 refills | Status: DC

## 2017-05-05 MED ORDER — FLUOXETINE HCL 40 MG PO CAPS: 40.0000 mg | ORAL_CAPSULE | Freq: Every day | ORAL | 3 refills | Status: DC

## 2017-05-05 NOTE — Progress Notes (Signed)
Patient ID: [name] [name], female     DOB: 1946-03-13, 71 y.o.    MRN: [id number]  PCP: [name] Mons, PA-C, new as of today.  Chief Complaint  Patient presents with  . Establish Care  . Depression    Depression scale score 16.    Subjective:   This patient is new to me and presents for evaluation of depression and to establish care.  Close friend, [name] [name], and daughter, [name] [name], are also my patients. In addition, I care for [name] [name]'s older daughter and [name] [name]'s wife. [name] [name]'s younger daughter works here on our [employer].  Isn't certain what health maintenance gaps she has, thinks that she is current, but we do not yet have records from her previous provider.  Moved back to this area from [place] 2 years ago, and while she is very happy, notes that in that time several "younger" friends have died, which has been hard. She feels blessed with the friendships she has, but feels a "huge loss" of those that have died in their 66's and younger. Self-described fixer, saver, doer. Lately hasn't been motivated to schedule, clean house, help her mother, etc. "I'm not suicidal, I'm too selfish for that." A long-time best friend has recommended that she get evaluated.  Previously did well on fluoxetine, but after many years, seemed to lose effectiveness, and she was changed to escitalopram about 2 years ago. That, too, worked initially, but feels like it's Not enough. She has seen a therapist remotely, when her daughter was spiraling down in depression and substance abuse. She relates that the therapist told her that she didn't have anything else to offer, so they ended their sessions after only a few. [name] [name] is doing really well now, having worked really hard to turn her life around. [name] [name] takes sertraline with good benefit. The patient relates that she'd like to be able to be better to herself now, and support the people she loves. She enjoys being active and serves on the neighborhood  board.  Review of Systems No chest pain, SOB, HA, dizziness, vision change, N/V, diarrhea, constipation, dysuria, urinary urgency or frequency, myalgias, arthralgias or rash.   Depression screen [facility] 2/9 05/05/2017 01/24/2016 10/28/2013  Decreased Interest 3 0 0  Down, Depressed, Hopeless 2 0 0  PHQ - 2 Score 5 0 0  Altered sleeping 2 - -  Tired, decreased energy 2 - -  Change in appetite 3 - -  Feeling bad or failure about yourself  3 - -  Trouble concentrating 0 - -  Moving slowly or fidgety/restless 1 - -  Suicidal thoughts 0 - -  PHQ-9 Score 16 - -  Difficult doing work/chores Very difficult - -     Prior to Admission medications   Medication Sig Start Date End Date Taking? Authorizing Provider  albuterol (PROAIR HFA) 108 (90 Base) MCG/ACT inhaler Inhale 1-2 puffs into the lungs every 4 (four) hours as needed for wheezing or shortness of breath. 04/09/16  Yes Noralee Space, MD  ALPRAZolam Duanne Moron) 0.5 MG tablet Take 0.5-1 mg by mouth at bedtime as needed. Sleep. 10/16/15  Yes [provider]  amLODipine (NORVASC) 5 MG tablet TAKE 1/2 TO 1 TABLET DAILY FOR BLOOD PRESSURE 02/21/14  Yes Vicie Mutters, PA-C  brimonidine [facility]) 0.2 % ophthalmic solution  10/19/16  Yes [provider]  cetirizine (ZYRTEC) 10 MG tablet Take 10 mg by mouth as needed for allergies.   Yes [provider]  Cholecalciferol (  HM VITAMIN D3) 4000 units CAPS Take 1 capsule by mouth daily.   Yes [provider]  co-enzyme Q-10 50 MG capsule Take 50 mg by mouth daily.   Yes [provider]  escitalopram (LEXAPRO) 20 MG tablet Take 20 mg by mouth daily. 07/23/15  Yes [provider]  fluticasone furoate-vilanterol (BREO ELLIPTA) 200-25 MCG/INH AEPB Inhale 1 puff into the lungs daily. 09/16/16  Yes Noralee Space, MD  INCRUSE ELLIPTA 62.5 MCG/INH AEPB Inhale 1 puff into the lungs daily. 09/16/16  Yes Noralee Space, MD  levothyroxine (SYNTHROID, LEVOTHROID) 100 MCG  tablet Take 100 mcg by mouth daily before breakfast.   Yes [provider]  pantoprazole (PROTONIX) 40 MG tablet Take 40 mg by mouth daily.   Yes [provider]  Probiotic Product (PROBIOTIC PO) Take 1 tablet by mouth at bedtime.    Yes [provider]     Allergies  Allergen Reactions  . Iodine     Pt has only one kidney.  . Iohexol      Code: RASH, Desc: PT STATES SHE HAS ONE KIDNEY SO NOT TO USE IV DYE/10/27/06/RM, Onset Date: [id number]   . Penicillins   . Sulfa Antibiotics Nausea Only     Patient Active Problem List   Diagnosis Date Noted  . Eosinophilia 11/27/2016  . H/O sleep apnea 11/27/2016  . Normocalcemic Hyperparathyroidism ([place]) 11/21/2016  . Osteoporosis 11/21/2016  . Allergic rhinitis 03/17/2016  . Multiple nasal polyps 03/17/2016  . Right ankle injury 01/28/2016  . Chronic obstructive airway disease with asthma ([place]) 12/24/2015  . Vitamin D Deficiency 02/14/2014  . Other abnormal glucose 02/14/2014  . Encounter for long-term (current) use of other medications 12/13/2013  . Hypertension   . Hyperlipidemia   . Cervical dysplasia   . DUB (dysfunctional uterine bleeding)   . Hypothyroidism 11/30/2007     Family History  Problem Relation Age of Onset  . Hypertension Mother   . Heart disease Mother   . Asthma Mother   . Lung cancer Father   . Cancer Father        lung  . Hypertension Sister   . Cancer Sister        Stem cell cancer  . Hypertension Brother   . Stroke Brother   . Cancer Brother        prostate cancer  . Ovarian cancer Maternal Aunt   . Breast cancer Maternal Aunt        Age 94's  . Cancer Maternal Uncle        Prostate cancer  . Breast cancer Maternal Aunt        Age 55's     Social History   Social History  . Marital status: Married    Spouse name: [name] [name]  . Number of children: 1  . Years of education: 12th grade   Occupational History  . 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 Smoking status: Former Smoker    Packs/day: 5.00    Years: 10.00    Types: Cigarettes    Quit date: 08/23/1983  . Smokeless tobacco: Never Used  . Alcohol use 12.6 oz/week    21 Standard drinks or equivalent per week     Comment: social  . Drug use: No  . Sexual activity: Not Currently    Birth control/ protection: Surgical     Comment: HYST   Other Topics Concern  . Not  on file   Social History Narrative   Lives with her 2nd husband and 2 dogs.   Mother, aunt, daughter and granddaughters live nearby.   Is working on mending estranged relationships with her siblings.         Objective:  Physical Exam  Constitutional: She is oriented to person, place, and time. She appears well-developed and well-nourished. She is active and cooperative. No distress.  BP 116/75 (BP Location: Right Arm, Patient Position: Sitting, Cuff Size: Normal)   Pulse 61   Temp 98.3 F (36.8 C) (Oral)   Resp 18   Ht 5\' 6"  (1.676 m)   Wt 171 lb 6.4 oz (77.7 kg)   SpO2 97%   BMI 27.66 kg/m   HENT:  Head: Normocephalic and atraumatic.  Right Ear: Hearing normal.  Left Ear: Hearing normal.  Eyes: Conjunctivae are normal. No scleral icterus.  Neck: Normal range of motion. Neck supple. No thyromegaly present.  Cardiovascular: Normal rate, regular rhythm and normal heart sounds.   Pulses:      Radial pulses are 2+ on the right side, and 2+ on the left side.  Pulmonary/Chest: Effort normal and breath sounds normal.  Lymphadenopathy:       Head (right side): No tonsillar, no preauricular, no posterior auricular and no occipital adenopathy present.       Head (left side): No tonsillar, no preauricular, no posterior auricular and no occipital adenopathy present.    She has no cervical adenopathy.       Right: No supraclavicular adenopathy present.       Left: No supraclavicular adenopathy present.  Neurological: She is alert and oriented to person, place, and time. No  sensory deficit.  Skin: Skin is warm, dry and intact. No rash noted. No cyanosis or erythema. Nails show no clubbing.  Psychiatric: She has a normal mood and affect. Her speech is normal and behavior is normal.       Assessment & Plan:   Problem List Items Addressed This Visit    Hypertension    Controlled. No changes.      Relevant Medications   amLODipine (NORVASC) 5 MG tablet   Depression    Inadequate control on escitalopram presently, though initially effective. Stop escitalopram and resume fluoxetine, which worked well previously. If ineffective, would try sertraline, as her daughter has done well with it, before moving on to an alternate class. Would then consider venlafaxine.      Relevant Medications   FLUoxetine (PROZAC) 40 MG capsule   ALPRAZolam (XANAX) 0.5 MG tablet    Other Visit Diagnoses    Encounter to establish care    -  Primary   Need for shingles vaccine         We will request her records in order to assess for any care gaps.  Return in about 4 weeks (around 06/02/2017) for re-evaluation of mood.   Fara Chute, PA-C [facility]

## 2017-05-05 NOTE — Patient Instructions (Addendum)
Call [facility] to schedule with your therapist. If you need a new referral, let me know.  The Shingrix is the shingles vaccine. I've sent the prescription to your pharmacy, where it can be administered (2 doses, 2-6 months apart).  Stop the escitalopram (Lexapro). Replace it with the fluoxetine (Prozac). Take one each day.    IF you received an x-ray today, you will receive an invoice from [facility]. Please contact [facility] at [phone] with questions or concerns regarding your invoice.   IF you received labwork today, you will receive an invoice from [facility]. Please contact [facility] at [phone] with questions or concerns regarding your invoice.   Our billing staff will not be able to assist you with questions regarding bills from these companies.  You will be contacted with the lab results as soon as they are available. The fastest way to get your results is to activate your My Chart account. Instructions are located on the last page of this paperwork. If you have not heard from [place] regarding the results in 2 weeks, please contact this office.

## 2017-05-06 ENCOUNTER — Encounter: Payer: Self-pay | Admitting: Physician Assistant

## 2017-05-06 NOTE — Assessment & Plan Note (Signed)
Controlled. No changes. 

## 2017-05-06 NOTE — Assessment & Plan Note (Signed)
Inadequate control on escitalopram presently, though initially effective. Stop escitalopram and resume fluoxetine, which worked well previously. If ineffective, would try sertraline, as her daughter has done well with it, before moving on to an alternate class. Would then consider venlafaxine.

## 2017-05-19 ENCOUNTER — Telehealth: Payer: Self-pay | Admitting: Family Medicine

## 2017-05-19 DIAGNOSIS — K219 Gastro-esophageal reflux disease without esophagitis: Secondary | ICD-10-CM | POA: Diagnosis not present

## 2017-05-19 DIAGNOSIS — Z1211 Encounter for screening for malignant neoplasm of colon: Secondary | ICD-10-CM | POA: Diagnosis not present

## 2017-05-19 DIAGNOSIS — K52832 Lymphocytic colitis: Secondary | ICD-10-CM | POA: Diagnosis not present

## 2017-05-19 DIAGNOSIS — K449 Diaphragmatic hernia without obstruction or gangrene: Secondary | ICD-10-CM | POA: Diagnosis not present

## 2017-05-19 DIAGNOSIS — Z86010 Personal history of colonic polyps: Secondary | ICD-10-CM | POA: Diagnosis not present

## 2017-05-19 NOTE — Telephone Encounter (Signed)
See note below. Please advise.  

## 2017-05-19 NOTE — Telephone Encounter (Signed)
I think that there are 2 options: 1. Reduce the fluoxetine from 40 mg to 20 mg, or 2. Go back to the escitalopram (Lexapro) 20 mg.  If the headache persists, I recommend that she come in for headache evaluation.  If the headache resolves with either of the options, but her depression persists (she was not adequately controlled on the escitalopram), we can try sertraline, as we discussed.

## 2017-05-19 NOTE — Telephone Encounter (Addendum)
(  PLEASE SEND THIS MESSAGE TO CHELLE BECAUSE I CHOSE DR. Carlota Raspberry IN ERROR) PATIENT STATES SHE SAW CHELLE 2 WEEKS AGO AND SHE PRESCRIBED HER TO HAVE FLUOXETINE (PROZAC) 40 MG. CHELLE TOLD HER TO CALL HER BACK IF SHE HAD ANY PROBLEMS OR CONCERNS. PATIENT SAYS FOR ABOUT A WEEK SHE HAS BEEN HAVING HEADACHES (SHE NORMALLY DOES NOT GET) AND THAT SHE JUST DOES NOT "FEEL WELL." SHE TOOK THIS MEDICINE MANY YEARS AGO AND SHE DID FINE. COULD THIS BE A REACTION? BEST PHONE [phone] (CELL) PHARMACY CHOICE IS [facility] ON [address]. [place]

## 2017-05-20 ENCOUNTER — Ambulatory Visit (INDEPENDENT_AMBULATORY_CARE_PROVIDER_SITE_OTHER): Payer: Medicare Other | Admitting: Physician Assistant

## 2017-05-20 ENCOUNTER — Encounter: Payer: Self-pay | Admitting: Physician Assistant

## 2017-05-20 VITALS — BP 131/83 | HR 86 | Resp 16 | Ht 66.0 in | Wt 170.8 lb

## 2017-05-20 DIAGNOSIS — F411 Generalized anxiety disorder: Secondary | ICD-10-CM | POA: Diagnosis not present

## 2017-05-20 DIAGNOSIS — F32A Depression, unspecified: Secondary | ICD-10-CM

## 2017-05-20 DIAGNOSIS — F329 Major depressive disorder, single episode, unspecified: Secondary | ICD-10-CM | POA: Diagnosis not present

## 2017-05-20 NOTE — Patient Instructions (Signed)
     IF you received an x-ray today, you will receive an invoice from [facility]. Please contact [facility] at [phone] with questions or concerns regarding your invoice.   IF you received labwork today, you will receive an invoice from [facility]. Please contact [facility] at [phone] with questions or concerns regarding your invoice.   Our billing staff will not be able to assist you with questions regarding bills from these companies.  You will be contacted with the lab results as soon as they are available. The fastest way to get your results is to activate your My Chart account. Instructions are located on the last page of this paperwork. If you have not heard from us regarding the results in 2 weeks, please contact this office.     

## 2017-05-20 NOTE — Telephone Encounter (Signed)
Pt will check her calendar and call our office to schedule ov.

## 2017-05-20 NOTE — Progress Notes (Signed)
[name] [name]  MRN: [id number] DOB: [birth year]  PCP: Harrison Mons, PA-C  Chief Complaint  Patient presents with  . Headache    per patient, began prozac appx 15 days ago and has had headache since. Patient also states that she does not think medication is working    Subjective:  Pt presents to clinic for follow up on starting prozac. She recently switched from 20mg  lexipro  To 40 mg prozac.  Patient comes into the clinic today due to a headaches that started at the end of last week. She started prozac 15 days ago and is having "dull,ache, naggy headaches." She said it feels like sinus headaches she had in the past, but denies sinus issues today. Has been taking "3 325mg  ibuprofen pills," which help until 4-6 hours later when the medication wears off. She is feeling the same on the 40mg  prozac as the previous 20mg  lexipro, which she was on for 20 years and felt it was not working anymore. She is feeling edgy and irritable today, and it very anxious with her granddaughters wedding coming up.   Review of Systems  HENT: Negative for sinus pressure.   Eyes: Negative for photophobia and visual disturbance.  Gastrointestinal: Positive for nausea. Negative for constipation, diarrhea and vomiting.  Psychiatric/Behavioral: Positive for dysphoric mood. The patient is nervous/anxious.     Patient Active Problem List   Diagnosis Date Noted  . Single kidney 05/05/2017  . Depression 05/05/2017  . Eosinophilia 11/27/2016  . H/O sleep apnea 11/27/2016  . Normocalcemic Hyperparathyroidism ([place]) 11/21/2016  . Osteoporosis 11/21/2016  . Allergic rhinitis 03/17/2016  . Multiple nasal polyps 03/17/2016  . Right ankle injury 01/28/2016  . Chronic obstructive airway disease with asthma ([place]) 12/24/2015  . Vitamin D Deficiency 02/14/2014  . Other abnormal glucose 02/14/2014  . Encounter for long-term (current) use of other medications 12/13/2013  . Hypertension   . Hyperlipidemia   .  Cervical dysplasia   . DUB (dysfunctional uterine bleeding)   . Hypothyroidism 11/30/2007    Current Outpatient Prescriptions on File Prior to Visit  Medication Sig Dispense Refill  . albuterol (PROAIR HFA) 108 (90 Base) MCG/ACT inhaler Inhale 1-2 puffs into the lungs every 4 (four) hours as needed for wheezing or shortness of breath. 1 Inhaler 3  . ALPRAZolam (XANAX) 0.5 MG tablet Take 1-2 tablets (0.5-1 mg total) by mouth at bedtime as needed. Sleep. 60 tablet 0  . amLODipine (NORVASC) 5 MG tablet TAKE 1/2 TO 1 TABLET DAILY FOR BLOOD PRESSURE 45 tablet 3  . brimonidine (ALPHAGAN) 0.2 % ophthalmic solution     . cetirizine (ZYRTEC) 10 MG tablet Take 10 mg by mouth as needed for allergies.    . Cholecalciferol (HM VITAMIN D3) 4000 units CAPS Take 1 capsule by mouth daily.    Marland Kitchen co-enzyme Q-10 50 MG capsule Take 50 mg by mouth daily.    Marland Kitchen escitalopram (LEXAPRO) 20 MG tablet Take 20 mg by mouth daily.  1  . FLUoxetine (PROZAC) 40 MG capsule Take 1 capsule (40 mg total) by mouth daily. 90 capsule 3  . fluticasone furoate-vilanterol (BREO ELLIPTA) 200-25 MCG/INH AEPB Inhale 1 puff into the lungs daily. 60 each 5  . levothyroxine (SYNTHROID, LEVOTHROID) 100 MCG tablet Take 100 mcg by mouth daily before breakfast.    . pantoprazole (PROTONIX) 40 MG tablet Take 40 mg by mouth daily.    . Probiotic Product (PROBIOTIC PO) Take 1 tablet by mouth at bedtime.  No current facility-administered medications on file prior to visit.     Allergies  Allergen Reactions  . Iodine     Pt has only one kidney.  . Iohexol      Code: RASH, Desc: PT STATES SHE HAS ONE KIDNEY SO NOT TO USE IV DYE/10/27/06/RM, Onset Date: [id number]   . Penicillins   . Sulfa Antibiotics Nausea Only    Past Medical History:  Diagnosis Date  . Allergy   . Anxiety   . Asthma   . Cancer ([place])    Kidney cancer-Hypernephroma  . Cervical dysplasia   . COPD (chronic obstructive pulmonary disease) ([place])   . Depression   .  Elevated cholesterol   . GERD (gastroesophageal reflux disease)   . Glaucoma   . Hypertension   . Lymphocytic colitis   . Osteoporosis 08/2015   T score -3.2  . Serum calcium elevated    Social History   Social History Narrative   Lives with her 2nd husband and 2 dogs.   Mother, aunt, daughter and granddaughters live nearby.   Is working on mending estranged relationships with her siblings.   Social History  Substance Use Topics  . Smoking status: Former Smoker    Packs/day: 5.00    Years: 10.00    Types: Cigarettes    Quit date: 08/23/1983  . Smokeless tobacco: Never Used  . Alcohol use 12.6 oz/week    21 Standard drinks or equivalent per week     Comment: social   family history includes Arthritis in her daughter; Asthma in her mother; Breast cancer in her maternal aunt and maternal aunt; Cancer in her brother, father, maternal uncle, and sister; Heart disease in her mother; Hypertension in her brother, daughter, mother, and sister; Lung cancer in her father; Ovarian cancer in her maternal aunt; Stroke in her brother.     Objective:  BP 131/83 (BP Location: Right Arm, Patient Position: Sitting, Cuff Size: Normal)   Pulse 86   Resp 16   Ht 5\' 6"  (1.676 m)   Wt 170 lb 12.8 oz (77.5 kg)   SpO2 97%   BMI 27.57 kg/m  Body mass index is 27.57 kg/m.  Physical Exam  Constitutional: She is oriented to person, place, and time and well-developed, well-nourished, and in no distress.  HENT:  Head: Normocephalic and atraumatic.  Right Ear: Hearing and external ear normal.  Left Ear: Hearing and external ear normal.  Eyes: Conjunctivae are normal.  Neck: Normal range of motion.  Cardiovascular: Normal rate, regular rhythm and normal heart sounds.   Pulmonary/Chest: Effort normal and breath sounds normal.  Neurological: She is alert and oriented to person, place, and time. Gait normal.  Skin: Skin is warm and dry.  Psychiatric: Memory, affect and judgment normal. Her mood  appears anxious.  Vitals reviewed.   Assessment and Plan :  Anxiety -continue taking 40mg  prozac. The headache should subside and was educated that this is a side effect of the medication, but if present for 2 more weeks, please let us know and make an appointment as medication switch at that time would ne warrented - also d/w pt that her not feeling anything from the prozac at this time is not abnormal  It is also interesting as she has stopped the lexparo she has not felt worse and that proves that it was not helping her that much - hopefully due to the ability to push the dose of prozac we will get her relief from her anxiety.  Please return to the office in 4-6 weeks to re-evaluate the medication.  Windell Hummingbird PA-C  [facility] 05/20/2017 6:25 PM

## 2017-05-21 ENCOUNTER — Ambulatory Visit: Payer: Medicare Other | Admitting: Internal Medicine

## 2017-05-21 NOTE — Telephone Encounter (Signed)
Patient was seen on 05/20/2017, addressed at this time./ S.Zaydee Aina,CMA

## 2017-05-22 DIAGNOSIS — H5201 Hypermetropia, right eye: Secondary | ICD-10-CM | POA: Diagnosis not present

## 2017-05-22 DIAGNOSIS — H401134 Primary open-angle glaucoma, bilateral, indeterminate stage: Secondary | ICD-10-CM | POA: Diagnosis not present

## 2017-05-26 ENCOUNTER — Ambulatory Visit: Payer: Medicare Other | Admitting: Pulmonary Disease

## 2017-06-02 ENCOUNTER — Encounter: Payer: Self-pay | Admitting: Physician Assistant

## 2017-06-02 ENCOUNTER — Ambulatory Visit (INDEPENDENT_AMBULATORY_CARE_PROVIDER_SITE_OTHER): Payer: Medicare Other | Admitting: Physician Assistant

## 2017-06-02 DIAGNOSIS — F32A Depression, unspecified: Secondary | ICD-10-CM

## 2017-06-02 DIAGNOSIS — F329 Major depressive disorder, single episode, unspecified: Secondary | ICD-10-CM | POA: Diagnosis not present

## 2017-06-02 MED ORDER — ALPRAZOLAM 0.5 MG PO TABS: 0.5000 mg | ORAL_TABLET | Freq: Every evening | ORAL | 0 refills | Status: DC | PRN

## 2017-06-02 MED ORDER — SERTRALINE HCL 50 MG PO TABS: 50.0000 mg | ORAL_TABLET | Freq: Every day | ORAL | 1 refills | Status: DC

## 2017-06-02 NOTE — Progress Notes (Signed)
Patient ID: [name] [name], female    DOB: 03-19-1946, 71 y.o.   MRN: [id number]  PCP: Harrison Mons, PA-C  Chief Complaint  Patient presents with  . Depression    Depression scale score 11  . Med Check    Pt states she would like to make some changes to her meds because she came in and saw Sarah for headaches from the Prozac. Pt's husband states he thought she was having nervous breakdown this past Friday night. Pt states she was drinking Friday evening ( 1 martini and 2 glasses of wine) and began sobbing uncontrollably and a feeling out of control from her body. Pt's husband states she kept saying "I'm so sad".    Subjective:   Presents for evaluation of mood. She is accompanied by her husband.  Recall that 4 weeks ago, we switched her from escitalopram to fluoxetine. She had found that after about 2 years, escitalopram wasn't effective anymore. We selected fluoxetine, as she had taken it with good results and no adverse effects until a few years ago, when it lost it's effectiveness and she was switched to escitalopram. When re-starting fluoxetine, she experienced daily headaches. Now they are less frequent, and less severe, but persist. Husband has noticed a mood change since the change from escitalopram to fluoxetine. More subdued, reserved, quiet. "Not herself."  Several nights ago, she and her husband were having a drink at home together (it is typical for her to have a drink in the evenings, usually a martini) after dinner, during which she had 2 glasses of wine, which is more than usual for her. All of a sudden she felt completely overwhelmed and began sobbing, unable to stop. Felt "out of control of her body," and repeatedly cried "I'm so sad!" Her husband was alarmed, as this has never happened before.  Both her daughter and brother take sertraline, and we had discussed this as an alternative option. Daughter, brother and granddaughter have ADD. She thinks she does, as  well, but has always been able to manage it.   Review of Systems As above. No CP< SOB, dizziness,visual changes, numbness or weakness. No nausea, vomiting or diarrhea.    Patient Active Problem List   Diagnosis Date Noted  . Single kidney 05/05/2017  . Depression 05/05/2017  . Eosinophilia 11/27/2016  . H/O sleep apnea 11/27/2016  . Normocalcemic Hyperparathyroidism ([place]) 11/21/2016  . Osteoporosis 11/21/2016  . Allergic rhinitis 03/17/2016  . Multiple nasal polyps 03/17/2016  . Right ankle injury 01/28/2016  . Chronic obstructive airway disease with asthma ([place]) 12/24/2015  . Vitamin D Deficiency 02/14/2014  . Other abnormal glucose 02/14/2014  . Encounter for long-term (current) use of other medications 12/13/2013  . Hypertension   . Hyperlipidemia   . Cervical dysplasia   . DUB (dysfunctional uterine bleeding)   . Hypothyroidism 11/30/2007     Prior to Admission medications   Medication Sig Start Date End Date Taking? Authorizing Provider  albuterol (PROAIR HFA) 108 (90 Base) MCG/ACT inhaler Inhale 1-2 puffs into the lungs every 4 (four) hours as needed for wheezing or shortness of breath. 04/09/16  Yes Noralee Space, MD  ALPRAZolam Duanne Moron) 0.5 MG tablet Take 1-2 tablets (0.5-1 mg total) by mouth at bedtime as needed. Sleep. 05/05/17  Yes Duston Smolenski, PA-C  amLODipine (NORVASC) 5 MG tablet TAKE 1/2 TO 1 TABLET DAILY FOR BLOOD PRESSURE 05/05/17  Yes Avalyn Molino, PA-C  brimonidine (ALPHAGAN) 0.2 % ophthalmic solution  10/19/16  Yes  [provider]  cetirizine (ZYRTEC) 10 MG tablet Take 10 mg by mouth as needed for allergies.   Yes [provider]  Cholecalciferol (HM VITAMIN D3) 4000 units CAPS Take 1 capsule by mouth daily.   Yes [provider]  co-enzyme Q-10 50 MG capsule Take 50 mg by mouth daily.   Yes [provider]  FLUoxetine (PROZAC) 40 MG capsule Take 1 capsule (40 mg total) by mouth daily. 05/05/17  Yes Dupree Givler,  PA-C  levothyroxine (SYNTHROID, LEVOTHROID) 100 MCG tablet Take 100 mcg by mouth daily before breakfast.   Yes [provider]  pantoprazole (PROTONIX) 40 MG tablet Take 40 mg by mouth daily.   Yes [provider]  Probiotic Product (PROBIOTIC PO) Take 1 tablet by mouth at bedtime.    Yes [provider]  escitalopram (LEXAPRO) 20 MG tablet Take 20 mg by mouth daily. 07/23/15   [provider]  fluticasone furoate-vilanterol (BREO ELLIPTA) 200-25 MCG/INH AEPB Inhale 1 puff into the lungs daily. Patient not taking: Reported on 06/02/2017 09/16/16   Noralee Space, MD     Allergies  Allergen Reactions  . Iodine     Pt has only one kidney.  . Iohexol      Code: RASH, Desc: PT STATES SHE HAS ONE KIDNEY SO NOT TO USE IV DYE/10/27/06/RM, Onset Date: [id number]   . Penicillins   . Sulfa Antibiotics Nausea Only       Objective:  Physical Exam  Constitutional: She is oriented to person, place, and time. She appears well-developed and well-nourished. She is active and cooperative. No distress.  BP 133/85 (BP Location: Right Arm, Patient Position: Sitting, Cuff Size: Normal)   Pulse 76   Temp 98.3 F (36.8 C) (Oral)   Resp 18   Ht 5\' 6"  (1.676 m)   Wt 170 lb (77.1 kg)   SpO2 98%   BMI 27.44 kg/m   HENT:  Head: Normocephalic and atraumatic.  Right Ear: Hearing normal.  Left Ear: Hearing normal.  Eyes: Conjunctivae are normal. No scleral icterus.  Neck: Normal range of motion. Neck supple. No thyromegaly present.  Cardiovascular: Normal rate, regular rhythm and normal heart sounds.   Pulses:      Radial pulses are 2+ on the right side, and 2+ on the left side.  Pulmonary/Chest: Effort normal and breath sounds normal.  Lymphadenopathy:       Head (right side): No tonsillar, no preauricular, no posterior auricular and no occipital adenopathy present.       Head (left side): No tonsillar, no preauricular, no posterior auricular and no occipital adenopathy  present.    She has no cervical adenopathy.       Right: No supraclavicular adenopathy present.       Left: No supraclavicular adenopathy present.  Neurological: She is alert and oriented to person, place, and time. No sensory deficit.  Skin: Skin is warm, dry and intact. No rash noted. No cyanosis or erythema. Nails show no clubbing.  Psychiatric: She has a normal mood and affect. Her speech is normal and behavior is normal.       Assessment & Plan:   Problem List Items Addressed This Visit    Depression    Switch to  Sertraline, as her daughter and brother both take it successfully. Would add bupropion if need alternate or additional agent.      Relevant Medications   ALPRAZolam (XANAX) 0.5 MG tablet   sertraline (ZOLOFT) 50 MG tablet  Return in about 4 weeks (around 06/30/2017) for re-evaluation of mood.   Fara Chute, PA-C [facility] [place]

## 2017-06-02 NOTE — Patient Instructions (Addendum)
Start the Zoloft (sertraline) by taking 1/2 tablet (25 mg) each morning for the first week. Then, increase to 1 tablet (50 mg) each morning. After another week, you may increase to 1.5 (75 mg) or 2 (100 mg) tablets.    IF you received an x-ray today, you will receive an invoice from [facility]. Please contact [facility] at [phone] with questions or concerns regarding your invoice.   IF you received labwork today, you will receive an invoice from [facility]. Please contact [facility] at [phone] with questions or concerns regarding your invoice.   Our billing staff will not be able to assist you with questions regarding bills from these companies.  You will be contacted with the lab results as soon as they are available. The fastest way to get your results is to activate your My Chart account. Instructions are located on the last page of this paperwork. If you have not heard from [place] regarding the results in 2 weeks, please contact this office.

## 2017-06-02 NOTE — Assessment & Plan Note (Signed)
Switch to  Sertraline, as her daughter and brother both take it successfully. Would add bupropion if need alternate or additional agent.

## 2017-06-05 ENCOUNTER — Ambulatory Visit: Payer: Medicare Other | Admitting: Physician Assistant

## 2017-06-16 DIAGNOSIS — R079 Chest pain, unspecified: Secondary | ICD-10-CM | POA: Diagnosis not present

## 2017-06-16 DIAGNOSIS — K625 Hemorrhage of anus and rectum: Secondary | ICD-10-CM | POA: Diagnosis not present

## 2017-06-16 DIAGNOSIS — K219 Gastro-esophageal reflux disease without esophagitis: Secondary | ICD-10-CM | POA: Diagnosis not present

## 2017-06-16 DIAGNOSIS — R194 Change in bowel habit: Secondary | ICD-10-CM | POA: Diagnosis not present

## 2017-06-16 DIAGNOSIS — K52832 Lymphocytic colitis: Secondary | ICD-10-CM | POA: Diagnosis not present

## 2017-06-17 ENCOUNTER — Encounter: Payer: Self-pay | Admitting: Physician Assistant

## 2017-06-17 ENCOUNTER — Other Ambulatory Visit
Admission: RE | Admit: 2017-06-17 | Discharge: 2017-06-17 | Disposition: A | Discharge status: Home / Self Care | Payer: Medicare Other | Source: Ambulatory Visit | Attending: Internal Medicine | Admitting: Internal Medicine

## 2017-06-17 ENCOUNTER — Ambulatory Visit (INDEPENDENT_AMBULATORY_CARE_PROVIDER_SITE_OTHER): Payer: Medicare Other | Admitting: Physician Assistant

## 2017-06-17 ENCOUNTER — Ambulatory Visit (INDEPENDENT_AMBULATORY_CARE_PROVIDER_SITE_OTHER): Payer: Medicare Other

## 2017-06-17 VITALS — BP 147/85 | HR 68 | Resp 16 | Ht 66.0 in | Wt 172.4 lb

## 2017-06-17 DIAGNOSIS — Z0189 Encounter for other specified special examinations: Secondary | ICD-10-CM | POA: Diagnosis not present

## 2017-06-17 DIAGNOSIS — R079 Chest pain, unspecified: Secondary | ICD-10-CM

## 2017-06-17 DIAGNOSIS — N390 Urinary tract infection, site not specified: Secondary | ICD-10-CM

## 2017-06-17 DIAGNOSIS — I10 Essential (primary) hypertension: Secondary | ICD-10-CM | POA: Diagnosis not present

## 2017-06-17 DIAGNOSIS — R0789 Other chest pain: Secondary | ICD-10-CM | POA: Diagnosis not present

## 2017-06-17 DIAGNOSIS — R9431 Abnormal electrocardiogram [ECG] [EKG]: Secondary | ICD-10-CM | POA: Diagnosis not present

## 2017-06-17 LAB — POCT CBC
Granulocyte percent: 66.6 %G (ref 37–80)
HCT, POC: 37.8 % (ref 37.7–47.9)
HEMOGLOBIN: 13 g/dL (ref 12.2–16.2)
Lymph, poc: 2.3 (ref 0.6–3.4)
MCH, POC: 30.1 pg (ref 27–31.2)
MCHC: 34.4 g/dL (ref 31.8–35.4)
MCV: 87.4 fL (ref 80–97)
MID (cbc): 0.6 (ref 0–0.9)
MPV: 7.1 fL (ref 0–99.8)
PLATELET COUNT, POC: 204 10*3/uL (ref 142–424)
POC Granulocyte: 5.8 (ref 2–6.9)
POC LYMPH %: 26.2 % (ref 10–50)
POC MID %: 7.2 %M (ref 0–12)
RBC: 4.33 M/uL (ref 4.04–5.48)
RDW, POC: 13 %
WBC: 8.7 10*3/uL (ref 4.6–10.2)

## 2017-06-17 LAB — POCT URINALYSIS DIP (MANUAL ENTRY)
BILIRUBIN UA: NEGATIVE mg/dL
Bilirubin, UA: NEGATIVE
Glucose, UA: NEGATIVE mg/dL
NITRITE UA: NEGATIVE
PH UA: 7 (ref 5.0–8.0)
Spec Grav, UA: 1.02 (ref 1.010–1.025)
Urobilinogen, UA: 0.2 E.U./dL

## 2017-06-17 LAB — TROPONIN I

## 2017-06-17 MED ORDER — CEFTRIAXONE SODIUM 250 MG IJ SOLR: 1000.0000 mg | Freq: Once | INTRAMUSCULAR | 0 refills | Status: DC

## 2017-06-17 MED ORDER — CEFTRIAXONE SODIUM 1 G IJ SOLR
1.0000 g | Freq: Once | INTRAMUSCULAR | Status: AC
  Administered 2017-06-17: 1 g via INTRAMUSCULAR

## 2017-06-17 NOTE — Patient Instructions (Signed)
[facility]  Address: [address], [place], [place] [address]                       Phone: [phone]     IF you received an x-ray today, you will receive an invoice from [facility]. Please contact [facility] at [phone] with questions or concerns regarding your invoice.   IF you received labwork today, you will receive an invoice from [facility]. Please contact [facility] at [phone] with questions or concerns regarding your invoice.   Our billing staff will not be able to assist you with questions regarding bills from these companies.  You will be contacted with the lab results as soon as they are available. The fastest way to get your results is to activate your My Chart account. Instructions are located on the last page of this paperwork. If you have not heard from [place] regarding the results in 2 weeks, please contact this office.

## 2017-06-17 NOTE — Progress Notes (Signed)
06/17/2017 12:01 PM   DOB: [birth year] / MRN: [id number]  SUBJECTIVE:  [name] [name] is a 71 y.o. female with a history of dyslipidemia, HTN, presenting for "a chest rad."  She is seen by Dr. Collene Mares who plans to proceed with an endoscopy on 9/19 for suspected acid induced GERD.  She does take protonix. She describes the pain as dull and burning and a 7/10 and she associates some mild chills. She associates diarrhea and this does not burn. Patient denies any changes in medication aside from Zoloft which she started 2 weeks ago. She quit smoking over thirty years ago but tells me she still has COPD. She does have a history of sleep apnea and does not use CPAP per her pulmonologist.  She has one kidney due to nephrectomy 2/2 renal cell carcinoma.    She has been having some urinary pressure only with urination.    She is allergic to iodine; iohexol; penicillins; and sulfa antibiotics.   She  has a past medical history of Allergy; Anxiety; Asthma; Cancer ([place]); Cervical dysplasia; COPD (chronic obstructive pulmonary disease) ([place]); Depression; Elevated cholesterol; GERD (gastroesophageal reflux disease); Glaucoma; Hypertension; Lymphocytic colitis; Osteoporosis (08/2015); and Serum calcium elevated.    She  reports that she quit smoking about 33 years ago. Her smoking use included Cigarettes. She has a 50.00 pack-year smoking history. She has never used smokeless tobacco. She reports that she drinks about 12.6 oz of alcohol per week . She reports that she does not use drugs. She  reports that she does not currently engage in sexual activity. She reports using the following method of birth control/protection: Surgical. The patient  has a past surgical history that includes Abdominal hysterectomy (1989); Tonsillectomy; KIDNEY REMOVED (2002); Colposcopy; Oophorectomy; Augmentation mammaplasty; Cosmetic surgery; Eye surgery (2017); and Nose surgery (2017).  Her family history includes Arthritis in her  daughter; Asthma in her mother; Breast cancer in her maternal aunt and maternal aunt; Cancer in her brother, father, maternal uncle, and sister; Heart disease in her mother; Hypertension in her brother, daughter, mother, and sister; Lung cancer in her father; Ovarian cancer in her maternal aunt; Stroke in her brother.  Review of Systems  Respiratory: Negative for cough and shortness of breath.   Cardiovascular: Positive for chest pain. Negative for leg swelling.  Gastrointestinal: Positive for heartburn. Negative for abdominal pain, nausea and vomiting.  Musculoskeletal: Negative for myalgias.  Skin: Negative for rash.  Neurological: Negative for dizziness.    The problem list and medications were reviewed and updated by myself where necessary and exist elsewhere in the encounter.   OBJECTIVE:  BP (!) 147/85 (BP Location: Right Arm, Patient Position: Sitting, Cuff Size: Normal)   Pulse 68   Resp 16   Ht 5\' 6"  (1.676 m)   Wt 172 lb 6.4 oz (78.2 kg)   BMI 27.83 kg/m   Physical Exam  Constitutional: She is oriented to person, place, and time. She is active.  Non-toxic appearance.  Eyes: Pupils are equal, round, and reactive to light. EOM are normal.  Cardiovascular: Normal rate, regular rhythm, S1 normal, S2 normal, normal heart sounds and intact distal pulses.  Exam reveals no gallop, no friction rub and no decreased pulses.   No murmur heard. Pulmonary/Chest: Effort normal. No stridor. No tachypnea. No respiratory distress. She has no wheezes. She has no rales.  Abdominal: She exhibits no distension.  Musculoskeletal: She exhibits no edema.  Neurological: She is alert and oriented to person, place, and  time. She has normal strength and normal reflexes. She is not disoriented. She displays no atrophy. No cranial nerve deficit or sensory deficit. She exhibits normal muscle tone. Coordination and gait normal.  Skin: Skin is warm and dry. She is not diaphoretic. No pallor.  Psychiatric:  Her behavior is normal.    Results for orders placed or performed in visit on 06/17/17 (from the past 72 hour(s))  POCT urinalysis dipstick     Status: Abnormal   Collection Time: 06/17/17 10:29 AM  Result Value Ref Range   Color, UA yellow yellow   Clarity, UA cloudy (A) clear   Glucose, UA negative negative mg/dL   Bilirubin, UA negative negative   Ketones, POC UA negative negative mg/dL   Spec Grav, UA 1.020 1.010 - 1.025   Blood, UA moderate (A) negative   pH, UA 7.0 5.0 - 8.0   Protein Ur, POC =100 (A) negative mg/dL   Urobilinogen, UA 0.2 0.2 or 1.0 E.U./dL   Nitrite, UA Negative Negative   Leukocytes, UA Moderate (2+) (A) Negative  POCT CBC     Status: None   Collection Time: 06/17/17 10:47 AM  Result Value Ref Range   WBC 8.7 4.6 - 10.2 K/uL   Lymph, poc 2.3 0.6 - 3.4   POC LYMPH PERCENT 26.2 10 - 50 %L   MID (cbc) 0.6 0 - 0.9   POC MID % 7.2 0 - 12 %M   POC Granulocyte 5.8 2 - 6.9   Granulocyte percent 66.6 37 - 80 %G   RBC 4.33 4.04 - 5.48 M/uL   Hemoglobin 13.0 12.2 - 16.2 g/dL   HCT, POC 37.8 37.7 - 47.9 %   MCV 87.4 80 - 97 fL   MCH, POC 30.1 27 - 31.2 pg   MCHC 34.4 31.8 - 35.4 g/dL   RDW, POC 13.0 %   Platelet Count, POC 204 142 - 424 K/uL   MPV 7.1 0 - 99.8 fL   Lab Results  Component Value Date   CREATININE 1.0 02/06/2017     Dg Chest 2 View  Result Date: 06/17/2017 CLINICAL DATA:  Chest pressure EXAM: CHEST  2 VIEW COMPARISON:  11/08/2015 FINDINGS: Heart and mediastinal contours are within normal limits. No focal opacities or effusions. No acute bony abnormality. IMPRESSION: No active cardiopulmonary disease. Electronically Signed   By: Rolm Baptise M.D.   On: 06/17/2017 10:58    ASSESSMENT AND PLAN:  [name] [name] was seen today for dysuria and imaging.  Diagnoses and all orders for this visit:  Chest pain, unspecified type: She has had some ekg changes in the chest leads and has a new LBBB.  I have spoken with Dr. Brigitte Pulse who felt it prudent to consult  Dr. Nadyne Coombes who so graciously took my call and wants to see the patient in the office today.  Troponin cooking. She also has a UTI.  Culture is out.  Given urgency of cardiology visit today I have went ahead and covered for UTI with ceftriaxone 1 gram here.  She has a penicillin allergy however CHL shows she has had ceftriaxone in the past, twice. Culture pending.  -     DG Chest 2 View; Future -     EKG 12-Lead -     Troponin I -     POCT CBC -     POCT urinalysis dipstick -     Urine Culture    The patient is advised to call or return to clinic if  she does not see an improvement in symptoms, or to seek the care of the closest emergency department if she worsens with the above plan.   Philis Fendt, MHS, PA-C [facility] 06/17/2017 12:01 PM

## 2017-06-17 NOTE — Addendum Note (Signed)
Addended by: Tereasa Coop on: 06/17/2017 12:03 PM   Modules accepted: Orders

## 2017-06-18 ENCOUNTER — Encounter: Payer: Self-pay | Admitting: Physician Assistant

## 2017-06-18 ENCOUNTER — Telehealth: Payer: Self-pay | Admitting: Physician Assistant

## 2017-06-18 DIAGNOSIS — F411 Generalized anxiety disorder: Secondary | ICD-10-CM

## 2017-06-18 DIAGNOSIS — M5442 Lumbago with sciatica, left side: Secondary | ICD-10-CM

## 2017-06-18 DIAGNOSIS — G8929 Other chronic pain: Secondary | ICD-10-CM | POA: Insufficient documentation

## 2017-06-18 DIAGNOSIS — K589 Irritable bowel syndrome without diarrhea: Secondary | ICD-10-CM

## 2017-06-18 DIAGNOSIS — K52832 Lymphocytic colitis: Secondary | ICD-10-CM | POA: Insufficient documentation

## 2017-06-18 NOTE — Telephone Encounter (Signed)
Records received from [facility].  There are no vaccinations documented in the records received. Please contact [facility]. Do they have documentation of any vaccines on this patient?

## 2017-06-18 NOTE — Progress Notes (Signed)
Records review from [facility] 05/15/2016-05/15/2017.  Medicare Annual Wellness Visit 02/06/2017 Spirometry 05/26/2016 Eye Exam 12/2015 DEXA 2017 Mammogram 04/2016 [facility]) EKG 02/06/2017 Sinus rhythm, negative precordial T-waves, poor R wave progression, probably normal.

## 2017-06-19 DIAGNOSIS — I10 Essential (primary) hypertension: Secondary | ICD-10-CM | POA: Diagnosis not present

## 2017-06-19 DIAGNOSIS — R9431 Abnormal electrocardiogram [ECG] [EKG]: Secondary | ICD-10-CM | POA: Diagnosis not present

## 2017-06-19 DIAGNOSIS — R0789 Other chest pain: Secondary | ICD-10-CM | POA: Diagnosis not present

## 2017-06-20 LAB — URINE CULTURE

## 2017-06-22 LAB — TROPONIN I

## 2017-06-22 NOTE — Telephone Encounter (Signed)
Spoke with Pt and relayed the message. Pt states she understands and will reduce the Zoloft starting tomorrow and plans to make a appointment soon.

## 2017-06-22 NOTE — Telephone Encounter (Signed)
Pt calling to let chelle know that she has been having a lot of diarrhea since taking zoloft

## 2017-06-22 NOTE — Telephone Encounter (Signed)
Spoke with Pt and she states for the last week and half that she has been having diarrhea after she raised the dosage of Zoloft to 50 MG. Pt states she spoke to her gastroenterologist, Dr. Collene Mares, and (s)he stated that Zoloft could cause the diarrhea and she wanted to confirm with Chelle that diarrhea is a side effect and she needs to do. Pt also states she has a endoscopy and a colonoscopy with Dr. Collene Mares on September 19th.

## 2017-06-22 NOTE — Telephone Encounter (Signed)
Zoloft can, unfortunately, cause diarrhea. We don't see it often, but it's on the list of potential side effects. She can stop it, and we can try another medication, OR She could try reducing it back to 25 mg to see if the symptoms resolve, and then try increasing it again.

## 2017-06-29 ENCOUNTER — Telehealth: Payer: Self-pay | Admitting: Family Medicine

## 2017-06-29 DIAGNOSIS — F32A Depression, unspecified: Secondary | ICD-10-CM

## 2017-06-29 DIAGNOSIS — I10 Essential (primary) hypertension: Secondary | ICD-10-CM | POA: Diagnosis not present

## 2017-06-29 DIAGNOSIS — F329 Major depressive disorder, single episode, unspecified: Secondary | ICD-10-CM

## 2017-06-30 MED ORDER — ESCITALOPRAM OXALATE 20 MG PO TABS: 20.0000 mg | ORAL_TABLET | Freq: Every day | ORAL | 3 refills | Status: DC

## 2017-06-30 NOTE — Telephone Encounter (Signed)
Please advise 

## 2017-06-30 NOTE — Telephone Encounter (Signed)
Meds ordered this encounter  Medications  . escitalopram (LEXAPRO) 20 MG tablet    Sig: Take 1 tablet (20 mg total) by mouth daily.    Dispense:  90 tablet    Refill:  3    Cancel remaining fills of sertraline    Order Specific Question:   Supervising Provider    Answer:   Brigitte Pulse, EVA N [[id number]]

## 2017-07-01 DIAGNOSIS — K921 Melena: Secondary | ICD-10-CM | POA: Diagnosis not present

## 2017-07-01 DIAGNOSIS — K625 Hemorrhage of anus and rectum: Secondary | ICD-10-CM | POA: Diagnosis not present

## 2017-07-01 DIAGNOSIS — Z1211 Encounter for screening for malignant neoplasm of colon: Secondary | ICD-10-CM | POA: Diagnosis not present

## 2017-07-01 DIAGNOSIS — K228 Other specified diseases of esophagus: Secondary | ICD-10-CM | POA: Diagnosis not present

## 2017-07-01 DIAGNOSIS — D120 Benign neoplasm of cecum: Secondary | ICD-10-CM | POA: Diagnosis not present

## 2017-07-01 DIAGNOSIS — K219 Gastro-esophageal reflux disease without esophagitis: Secondary | ICD-10-CM | POA: Diagnosis not present

## 2017-07-01 DIAGNOSIS — K635 Polyp of colon: Secondary | ICD-10-CM | POA: Diagnosis not present

## 2017-07-01 DIAGNOSIS — D122 Benign neoplasm of ascending colon: Secondary | ICD-10-CM | POA: Diagnosis not present

## 2017-07-08 ENCOUNTER — Telehealth: Payer: Self-pay | Admitting: Physician Assistant

## 2017-07-08 DIAGNOSIS — F32A Depression, unspecified: Secondary | ICD-10-CM

## 2017-07-08 DIAGNOSIS — F329 Major depressive disorder, single episode, unspecified: Secondary | ICD-10-CM

## 2017-07-08 NOTE — Telephone Encounter (Signed)
Pt is needing a refill on her alprazalan and she is going out of town on Thursday afternoon and she has an appt with Chelle on Friday 07/17/17  Best number [phone]

## 2017-07-09 MED ORDER — ALPRAZOLAM 0.5 MG PO TABS: 0.5000 mg | ORAL_TABLET | Freq: Every evening | ORAL | 0 refills | Status: DC | PRN

## 2017-07-09 NOTE — Telephone Encounter (Signed)
Please advise 

## 2017-07-09 NOTE — Telephone Encounter (Signed)
Meds ordered this encounter  Medications  . ALPRAZolam (XANAX) 0.5 MG tablet    Sig: Take 1-2 tablets (0.5-1 mg total) by mouth at bedtime as needed. Sleep.    Dispense:  60 tablet    Refill:  0    Order Specific Question:   Supervising Provider    Answer:   Brigitte Pulse, EVA N [[id number]]

## 2017-07-10 ENCOUNTER — Other Ambulatory Visit: Payer: Self-pay | Admitting: Pulmonary Disease

## 2017-07-14 DIAGNOSIS — I10 Essential (primary) hypertension: Secondary | ICD-10-CM | POA: Diagnosis not present

## 2017-07-14 DIAGNOSIS — R0789 Other chest pain: Secondary | ICD-10-CM | POA: Diagnosis not present

## 2017-07-14 DIAGNOSIS — R9431 Abnormal electrocardiogram [ECG] [EKG]: Secondary | ICD-10-CM | POA: Diagnosis not present

## 2017-07-17 ENCOUNTER — Ambulatory Visit (INDEPENDENT_AMBULATORY_CARE_PROVIDER_SITE_OTHER): Payer: Medicare Other | Admitting: Physician Assistant

## 2017-07-17 ENCOUNTER — Encounter: Payer: Self-pay | Admitting: Physician Assistant

## 2017-07-17 VITALS — BP 112/62 | HR 87 | Temp 98.2°F | Resp 16 | Ht 66.0 in | Wt 170.4 lb

## 2017-07-17 DIAGNOSIS — F329 Major depressive disorder, single episode, unspecified: Secondary | ICD-10-CM

## 2017-07-17 DIAGNOSIS — F32A Depression, unspecified: Secondary | ICD-10-CM

## 2017-07-17 MED ORDER — VENLAFAXINE HCL ER 37.5 MG PO CP24: 37.5000 mg | ORAL_CAPSULE | Freq: Every day | ORAL | 1 refills | Status: DC

## 2017-07-17 NOTE — Progress Notes (Signed)
Patient ID: [name] [name], female    DOB: [birth year], 71 y.o.   MRN: [id number]  PCP: Harrison Mons, PA-C  Chief Complaint  Patient presents with  . Follow-up    depression, chest xray results,     Subjective:   Presents for evaluation of depression.  Recall that she was well controlled for years on fluoxetine, and then it seemed to stop working. She was switched to escitalopram, which worked well for a couple of years, but then seemed to become ineffective, too. When she presented to me this summer, we switched her back to the fluoxetine. She developed headaches and had no benefit, so was switched to sertraline, which her daughter had taken with success. That was ineffective and caused diarrhea, even at low dose. She contacted the office asking to be changed back to escitalopram, which I authorized.  Her pharmacy never called her to let her know that the escitalopram was ready, so she has continued on the sertraline. Isn't sure if the diarrhea is due to the SSRI or the colitis. We had previously discussed trying venlafaxine if we needed to try something else and she is interested in that.  The stress of her granddaughter's wedding is resolved-the event was exactly what they'd planned and everyone had a wonderful time.  She's still thinking about whether or not to get the flu vaccine.  In addition, she was seen here by one of my colleagues on 9/05 with chest pain. Apparently she'd been to see her GI specialist and was advised she needed the CP worked up before having the colonoscopy and upper endoscopy. EKG in our office revealed a LBBB and she was referred to cardiology for additional evaluation, though the CP was typical for her GERD symptoms and she was experiencing some relief with ranitidine. Stress test was 9/07, but I don't have that report. She underwent Transthoracic echocardiogram 10/02 revealing normal LV cavity size, mild concentric hypertrophy of the LV, EF 55%, mild  aortic regurgitation, mild mitral regurgitation, mild tricuspid regurgitation. Losartan and rosuvastatin were added to her regimen. She will follow-up with cardiology on 07/24/2017.  Upper endoscopy on 9/19 was notable for a small hiatal hernia. COlonoscopy on the same day revealed one small sessile polyp biopsied.  Review of Systems Constitutional: Positive for chills, diaphoresis and fatigue. Negative for fever.       Denies flushing  Respiratory: Negative for cough and shortness of breath.   Cardiovascular: Positive for chest pain. Negative for palpitations.  Gastrointestinal: Positive for abdominal pain, diarrhea and nausea. Negative for blood in stool and vomiting.  Neurological: Negative for dizziness, tremors, syncope, light-headedness and headaches.  Psychiatric/Behavioral: Negative for sleep disturbance. The patient is nervous/anxious.      Patient Active Problem List   Diagnosis Date Noted  . GAD (generalized anxiety disorder) 06/18/2017  . Chronic left-sided low back pain with left-sided sciatica 06/18/2017  . IBS (irritable bowel syndrome) 06/18/2017  . Single kidney 05/05/2017  . Depression 05/05/2017  . Eosinophilia 11/27/2016  . H/O sleep apnea 11/27/2016  . Normocalcemic Hyperparathyroidism ([place]) 11/21/2016  . Osteoporosis 11/21/2016  . Allergic rhinitis 03/17/2016  . Multiple nasal polyps 03/17/2016  . Chronic obstructive airway disease with asthma ([place]) 12/24/2015  . Vitamin D Deficiency 02/14/2014  . Other abnormal glucose 02/14/2014  . Encounter for long-term (current) use of other medications 12/13/2013  . Hypertension   . Hyperlipidemia   . Cervical dysplasia   . DUB (dysfunctional uterine bleeding)   . Hypothyroidism 11/30/2007  Prior to Admission medications   Medication Sig Start Date End Date Taking? Authorizing Provider  albuterol (PROAIR HFA) 108 (90 Base) MCG/ACT inhaler Inhale 1-2 puffs into the lungs every 4 (four) hours as needed for  wheezing or shortness of breath. 04/09/16  Yes Noralee Space, MD  ALPRAZolam Duanne Moron) 0.5 MG tablet Take 1-2 tablets (0.5-1 mg total) by mouth at bedtime as needed. Sleep. 07/09/17  Yes Achsah Mcquade, PA-C  amLODipine (NORVASC) 5 MG tablet TAKE 1/2 TO 1 TABLET DAILY FOR BLOOD PRESSURE 05/05/17  Yes Harrison Mons, PA-C  BREO ELLIPTA 200-25 MCG/INH AEPB USE 1 PUFF EVERY DAY 07/10/17  Yes Noralee Space, MD  brimonidine [facility]) 0.2 % ophthalmic solution  10/19/16  Yes [provider]  cetirizine (ZYRTEC) 10 MG tablet Take 10 mg by mouth as needed for allergies.   Yes [provider]  Cholecalciferol (HM VITAMIN D3) 4000 units CAPS Take 1 capsule by mouth daily.   Yes [provider]  co-enzyme Q-10 50 MG capsule Take 50 mg by mouth daily.   Yes [provider]  escitalopram (LEXAPRO) 20 MG tablet Take 1 tablet (20 mg total) by mouth daily. 06/30/17  Yes Christeena Krogh, PA-C  levothyroxine (SYNTHROID, LEVOTHROID) 100 MCG tablet Take 100 mcg by mouth daily before breakfast.   Yes [provider]  pantoprazole (PROTONIX) 40 MG tablet Take 40 mg by mouth daily.   Yes [provider]  Probiotic Product (PROBIOTIC PO) Take 1 tablet by mouth at bedtime.    Yes [provider]  losartan (COZAAR) 50 MG tablet Take 50 mg by mouth daily. 06/17/17   [provider]  rosuvastatin (CRESTOR) 10 MG tablet Take 10 mg by mouth daily. 06/17/17   [provider]  sertraline (ZOLOFT) 50 MG tablet Take 50 mg by mouth daily. 06/02/17   [provider]     Allergies  Allergen Reactions  . Iodine     Pt has only one kidney.  . Iohexol      Code: RASH, Desc: PT STATES SHE HAS ONE KIDNEY SO NOT TO USE IV DYE/10/27/06/RM, Onset Date: [id number]   . Penicillins   . Sulfa Antibiotics Nausea Only       Objective:  Physical Exam  Constitutional: She is oriented to person, place, and time. She appears well-developed and well-nourished.  She is active and cooperative. No distress.  BP 112/62   Pulse 87   Temp 98.2 F (36.8 C)   Resp 16   Ht 5\' 6"  (1.676 m)   Wt 170 lb 6.4 oz (77.3 kg)   SpO2 96%   BMI 27.50 kg/m   HENT:  Head: Normocephalic and atraumatic.  Right Ear: Hearing normal.  Left Ear: Hearing normal.  Eyes: Conjunctivae are normal. No scleral icterus.  Neck: Normal range of motion. Neck supple. No thyromegaly present.  Cardiovascular: Normal rate, regular rhythm and normal heart sounds.   Pulses:      Radial pulses are 2+ on the right side, and 2+ on the left side.  Pulmonary/Chest: Effort normal and breath sounds normal.  Lymphadenopathy:       Head (right side): No tonsillar, no preauricular, no posterior auricular and no occipital adenopathy present.       Head (left side): No tonsillar, no preauricular, no posterior auricular and no occipital adenopathy present.    She has no cervical adenopathy.       Right: No supraclavicular adenopathy present.       Left:  No supraclavicular adenopathy present.  Neurological: She is alert and oriented to person, place, and time. No sensory deficit.  Skin: Skin is warm, dry and intact. No rash noted. No cyanosis or erythema. Nails show no clubbing.  Psychiatric: She has a normal mood and affect. Her speech is normal and behavior is normal.       Assessment & Plan:   Problem List Items Addressed This Visit    Depression - Primary    Stop sertraline due to diarrhea and ineffectiveness. Trial of venlafaxine.      Relevant Medications   venlafaxine XR (EFFEXOR-XR) 37.5 MG 24 hr capsule       Return in about 6 weeks (around 08/28/2017) for re-evaluation of mood.   Fara Chute, PA-C [facility]

## 2017-07-17 NOTE — Progress Notes (Signed)
Subjective:    Patient ID: [name] [name], female    DOB: [birth year], 71 y.o.   MRN: [id number]  HPI  Chief Complaint  Patient presents with  . Follow-up    depression, chest xray results,     Patient presents today for re-evaluation of her chest pain and antidepressants. She presented on 06/17/2017 and was seen by Philis Fendt for work up of her chest pain prior to endoscopy and colonoscopy per Dr. Collene Mares. Patient saw Dr. Einar Gip the same day after a left bundle branch block was observed on EKG. CXR from that day showed no active cardiopulmonary disease. Dr. Einar Gip added Losartan and Rosuvastatin to her medication. Today, patient continues to   Stress test from 06/19/2017 is not in [facility] but patient reports it was normal. Echo from 07/14/2017: Left ventricle cavity is normal in size. Mild concentric hypertrophy of left ventricle. Normal global wall motion. Doppler evidence of grade I (impaired) diastolic dysfunction, normal LAP, Calculated EF 55%. Mild grade I aortic regurgitation. Moderate grade II mitral regurgitation. Mild tricuspid regurgitation. Pulmonary artery systolic pressure is estimated at 25-15mmHg. Trivial pericardial effusion.   Patient is following up with Dr. Einar Gip on 07/24/2017 to go over the results of her diagnostic testing.   Endoscopy 07/01/2017: Normal appearing, widely patent esophagus and GEJ- mild mucosal nodularity of GEJ-biopsied. Small hiatal hernia noted on retroflexion. Normal examined duodenum. No specimens collected.  Colonoscopy 07/01/2017 with Dr. Collene Mares: one small sessile polyp in cecum removed by cold biopsies. Single non-bleeding colonic angiodysplastic lesion. Small internal hemorrhoids.    She was taking Escitalopram for 2 years but then found that it was not effective anymore. Patient was then switched to fluoxetine but did not tolerate it well due to headaches. Patient has been taking 25mg  of Zoloft for the past month. About 3 days after starting it, she reports  she has been having diarrhea for the past month and 2 of the weeks she was confined to her house due to it. She states at first she thought it was her colitis, but she now thinks it is due to the Zoloft. She denies any oral antibiotic use within the last 6 months; she did get Ceftriaxone IM for her UTI on 06/17/17. When she was on the Zoloft, it did help but she is still not back to her "normal self." Patient did call the office regarding the diarrhea with Zoloft and she was prescribed Escitalopram but she did not start it, and continued on the Zoloft.   Review of Systems  Constitutional: Positive for chills, diaphoresis and fatigue. Negative for fever.       Denies flushing  Respiratory: Negative for cough and shortness of breath.   Cardiovascular: Positive for chest pain. Negative for palpitations.  Gastrointestinal: Positive for abdominal pain, diarrhea and nausea. Negative for blood in stool and vomiting.  Neurological: Negative for dizziness, tremors, syncope, light-headedness and headaches.  Psychiatric/Behavioral: Negative for sleep disturbance. The patient is nervous/anxious.    Patient Active Problem List   Diagnosis Date Noted  . GAD (generalized anxiety disorder) 06/18/2017  . Chronic left-sided low back pain with left-sided sciatica 06/18/2017  . IBS (irritable bowel syndrome) 06/18/2017  . Single kidney 05/05/2017  . Depression 05/05/2017  . Eosinophilia 11/27/2016  . H/O sleep apnea 11/27/2016  . Normocalcemic Hyperparathyroidism ([place]) 11/21/2016  . Osteoporosis 11/21/2016  . Allergic rhinitis 03/17/2016  . Multiple nasal polyps 03/17/2016  . Chronic obstructive airway disease with asthma ([place]) 12/24/2015  . Vitamin D Deficiency 02/14/2014  .  Other abnormal glucose 02/14/2014  . Encounter for long-term (current) use of other medications 12/13/2013  . Hypertension   . Hyperlipidemia   . Cervical dysplasia   . DUB (dysfunctional uterine bleeding)   . Hypothyroidism  11/30/2007   Prior to Admission medications   Medication Sig Start Date End Date Taking? Authorizing Provider  albuterol (PROAIR HFA) 108 (90 Base) MCG/ACT inhaler Inhale 1-2 puffs into the lungs every 4 (four) hours as needed for wheezing or shortness of breath. 04/09/16  Yes Noralee Space, MD  ALPRAZolam Duanne Moron) 0.5 MG tablet Take 1-2 tablets (0.5-1 mg total) by mouth at bedtime as needed. Sleep. 07/09/17  Yes Jeffery, Chelle, PA-C  amLODipine (NORVASC) 5 MG tablet TAKE 1/2 TO 1 TABLET DAILY FOR BLOOD PRESSURE 05/05/17  Yes Harrison Mons, PA-C  BREO ELLIPTA 200-25 MCG/INH AEPB USE 1 PUFF EVERY DAY 07/10/17  Yes Noralee Space, MD  brimonidine [facility]) 0.2 % ophthalmic solution  10/19/16  Yes [provider]  cetirizine (ZYRTEC) 10 MG tablet Take 10 mg by mouth as needed for allergies.   Yes [provider]  Cholecalciferol (HM VITAMIN D3) 4000 units CAPS Take 1 capsule by mouth daily.   Yes [provider]  co-enzyme Q-10 50 MG capsule Take 50 mg by mouth daily.   Yes [provider]  escitalopram (LEXAPRO) 20 MG tablet Take 1 tablet (20 mg total) by mouth daily. 06/30/17  Yes Jeffery, Chelle, PA-C  levothyroxine (SYNTHROID, LEVOTHROID) 100 MCG tablet Take 100 mcg by mouth daily before breakfast.   Yes [provider]  pantoprazole (PROTONIX) 40 MG tablet Take 40 mg by mouth daily.   Yes [provider]  Probiotic Product (PROBIOTIC PO) Take 1 tablet by mouth at bedtime.    Yes [provider]  losartan (COZAAR) 50 MG tablet Take 50 mg by mouth daily. 06/17/17   [provider]  rosuvastatin (CRESTOR) 10 MG tablet Take 10 mg by mouth daily. 06/17/17   [provider]  sertraline (ZOLOFT) 50 MG tablet Take 50 mg by mouth daily. 06/02/17   [provider]   Allergies  Allergen Reactions  . Iodine     Pt has only one kidney.  . Iohexol      Code: RASH, Desc: PT STATES SHE HAS ONE KIDNEY SO NOT TO USE IV  DYE/10/27/06/RM, Onset Date: [id number]   . Penicillins   . Sulfa Antibiotics Nausea Only   Social History   Social History  . Marital status: Married    Spouse name: Dominic  . Number of children: 1  . Years of education: 12th grade   Occupational History  . 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 Smoking status: Former Smoker    Packs/day: 5.00    Years: 10.00    Types: Cigarettes    Quit date: 08/23/1983  . Smokeless tobacco: Never Used  . Alcohol use 12.6 oz/week    21 Standard drinks or equivalent per week     Comment: social  . Drug use: No  . Sexual activity: Not Currently    Birth control/ protection: Surgical     Comment: HYST   Other Topics Concern  . Not on file   Social History Narrative   Lives with her 2nd husband and 2 dogs.   Mother, aunt, daughter and granddaughters live nearby.   Is working on mending estranged relationships with her siblings.       Objective:  Physical Exam  Constitutional: She is oriented to person, place, and time. She appears well-developed and well-nourished. No distress.  HENT:  Head: Normocephalic and atraumatic.  Right Ear: External ear normal.  Left Ear: External ear normal.  Neck: Neck supple. No JVD present. No tracheal deviation present. No thyromegaly present.  Cardiovascular: Normal rate, regular rhythm, normal heart sounds and intact distal pulses.  Exam reveals no gallop and no friction rub.   No murmur heard. Pulmonary/Chest: Effort normal and breath sounds normal. No stridor. No respiratory distress. She has no wheezes. She has no rales. She exhibits no tenderness.  Abdominal: Soft. Bowel sounds are normal. She exhibits no distension and no mass. There is no tenderness. There is no rebound and no guarding.  Lymphadenopathy:    She has no cervical adenopathy.  Neurological: She is alert and oriented to person, place, and time.  Skin: Skin is warm and dry. No rash noted.  She is not diaphoretic. No erythema. No pallor.      Assessment & Plan:  1. Depression, unspecified depression type - Discontinue Sertraline. Prescription for Venlafaxine provided today - venlafaxine XR (EFFEXOR-XR) 37.5 MG 24 hr capsule; Take 1 capsule (37.5 mg total) by mouth daily with breakfast. Increase to 2 capsules (75 mg) daily in 1-2 weeks.  Dispense: 60 capsule; Refill: 1  Follow up in 6 weeks to re-evaluate mood.   Respectfully, Denny Levy PA-S 2019

## 2017-07-17 NOTE — Patient Instructions (Signed)
     IF you received an x-ray today, you will receive an invoice from [facility]. Please contact [facility] at [phone] with questions or concerns regarding your invoice.   IF you received labwork today, you will receive an invoice from [facility]. Please contact [facility] at [phone] with questions or concerns regarding your invoice.   Our billing staff will not be able to assist you with questions regarding bills from these companies.  You will be contacted with the lab results as soon as they are available. The fastest way to get your results is to activate your My Chart account. Instructions are located on the last page of this paperwork. If you have not heard from us regarding the results in 2 weeks, please contact this office.     

## 2017-07-20 NOTE — Assessment & Plan Note (Signed)
Stop sertraline due to diarrhea and ineffectiveness. Trial of venlafaxine.

## 2017-07-30 DIAGNOSIS — R0789 Other chest pain: Secondary | ICD-10-CM | POA: Diagnosis not present

## 2017-07-30 DIAGNOSIS — Z905 Acquired absence of kidney: Secondary | ICD-10-CM | POA: Diagnosis not present

## 2017-07-30 DIAGNOSIS — Z0189 Encounter for other specified special examinations: Secondary | ICD-10-CM | POA: Diagnosis not present

## 2017-07-30 DIAGNOSIS — E7800 Pure hypercholesterolemia, unspecified: Secondary | ICD-10-CM | POA: Diagnosis not present

## 2017-07-31 DIAGNOSIS — H401134 Primary open-angle glaucoma, bilateral, indeterminate stage: Secondary | ICD-10-CM | POA: Diagnosis not present

## 2017-08-06 ENCOUNTER — Telehealth: Payer: Self-pay | Admitting: Physician Assistant

## 2017-08-06 DIAGNOSIS — F32A Depression, unspecified: Secondary | ICD-10-CM

## 2017-08-06 DIAGNOSIS — F329 Major depressive disorder, single episode, unspecified: Secondary | ICD-10-CM

## 2017-08-06 NOTE — Telephone Encounter (Signed)
Pt is needing a refill on alprazalam  Best number [phone]

## 2017-08-07 ENCOUNTER — Telehealth: Payer: Self-pay

## 2017-08-07 MED ORDER — ALPRAZOLAM 0.5 MG PO TABS: 0.5000 mg | ORAL_TABLET | Freq: Every evening | ORAL | 0 refills | Status: DC | PRN

## 2017-08-07 NOTE — Telephone Encounter (Signed)
Please advise 

## 2017-08-07 NOTE — Telephone Encounter (Signed)
Meds ordered this encounter  Medications  . ALPRAZolam (XANAX) 0.5 MG tablet    Sig: Take 1-2 tablets (0.5-1 mg total) by mouth at bedtime as needed. Sleep.    Dispense:  60 tablet    Refill:  0    Order Specific Question:   Supervising Provider    Answer:   Brigitte Pulse, EVA N [[id number]]

## 2017-08-10 ENCOUNTER — Telehealth: Payer: Self-pay | Admitting: Physician Assistant

## 2017-08-10 ENCOUNTER — Other Ambulatory Visit: Payer: Self-pay | Admitting: Pulmonary Disease

## 2017-08-10 ENCOUNTER — Other Ambulatory Visit: Payer: Self-pay | Admitting: Physician Assistant

## 2017-08-10 DIAGNOSIS — F32A Depression, unspecified: Secondary | ICD-10-CM

## 2017-08-10 DIAGNOSIS — F329 Major depressive disorder, single episode, unspecified: Secondary | ICD-10-CM

## 2017-08-10 MED ORDER — FLUTICASONE FUROATE-VILANTEROL 200-25 MCG/INH IN AEPB: 1.0000 | INHALATION_SPRAY | Freq: Every day | RESPIRATORY_TRACT | 3 refills | Status: DC

## 2017-08-10 NOTE — Telephone Encounter (Signed)
Pt states that the zanax rx needs to be resent to the [facility] states it was sent to [address] and since it is a controlled substance it can not be transferred  Best [phone]

## 2017-08-13 DIAGNOSIS — C44719 Basal cell carcinoma of skin of left lower limb, including hip: Secondary | ICD-10-CM

## 2017-08-13 DIAGNOSIS — C44722 Squamous cell carcinoma of skin of right lower limb, including hip: Secondary | ICD-10-CM

## 2017-08-13 HISTORY — DX: Squamous cell carcinoma of skin of right lower limb, including hip: C44.722

## 2017-08-13 HISTORY — DX: Basal cell carcinoma of skin of left lower limb, including hip: C44.719

## 2017-08-17 DIAGNOSIS — L570 Actinic keratosis: Secondary | ICD-10-CM | POA: Diagnosis not present

## 2017-08-17 DIAGNOSIS — C44722 Squamous cell carcinoma of skin of right lower limb, including hip: Secondary | ICD-10-CM | POA: Diagnosis not present

## 2017-08-17 DIAGNOSIS — Z08 Encounter for follow-up examination after completed treatment for malignant neoplasm: Secondary | ICD-10-CM | POA: Diagnosis not present

## 2017-08-17 DIAGNOSIS — Z85820 Personal history of malignant melanoma of skin: Secondary | ICD-10-CM | POA: Diagnosis not present

## 2017-08-17 DIAGNOSIS — L821 Other seborrheic keratosis: Secondary | ICD-10-CM | POA: Diagnosis not present

## 2017-08-17 DIAGNOSIS — Z1283 Encounter for screening for malignant neoplasm of skin: Secondary | ICD-10-CM | POA: Diagnosis not present

## 2017-08-24 DIAGNOSIS — H401134 Primary open-angle glaucoma, bilateral, indeterminate stage: Secondary | ICD-10-CM | POA: Diagnosis not present

## 2017-08-28 ENCOUNTER — Ambulatory Visit (INDEPENDENT_AMBULATORY_CARE_PROVIDER_SITE_OTHER): Payer: Medicare Other | Admitting: Physician Assistant

## 2017-08-28 ENCOUNTER — Encounter: Payer: Self-pay | Admitting: Physician Assistant

## 2017-08-28 VITALS — BP 110/72 | HR 94 | Temp 98.9°F | Resp 18 | Ht 66.0 in | Wt 172.2 lb

## 2017-08-28 DIAGNOSIS — E782 Mixed hyperlipidemia: Secondary | ICD-10-CM

## 2017-08-28 DIAGNOSIS — F329 Major depressive disorder, single episode, unspecified: Secondary | ICD-10-CM

## 2017-08-28 DIAGNOSIS — F411 Generalized anxiety disorder: Secondary | ICD-10-CM

## 2017-08-28 DIAGNOSIS — Z1159 Encounter for screening for other viral diseases: Secondary | ICD-10-CM

## 2017-08-28 DIAGNOSIS — Z23 Encounter for immunization: Secondary | ICD-10-CM

## 2017-08-28 DIAGNOSIS — F32A Depression, unspecified: Secondary | ICD-10-CM

## 2017-08-28 MED ORDER — VENLAFAXINE HCL ER 150 MG PO CP24: 150.0000 mg | ORAL_CAPSULE | Freq: Every day | ORAL | 3 refills | Status: AC

## 2017-08-28 MED ORDER — ALPRAZOLAM 0.5 MG PO TABS: 0.5000 mg | ORAL_TABLET | Freq: Every evening | ORAL | 0 refills | Status: DC | PRN

## 2017-08-28 NOTE — Progress Notes (Signed)
Patient ID: [name] [name], female    DOB: Feb 15, 1946, 71 y.o.   MRN: [id number]  PCP: Harrison Mons, PA-C  Chief Complaint  Patient presents with  . Depression    Depression scale score 1, pt states Effexor-XR has been working really well and she can tell a difference but thinks should could increase the doasge.  . Follow-up    6 week follow up    Subjective:   Presents for evaluation of depression. We started Venlafaxine ER 6 weeks ago, after previous SSRIs lost effectiveness and caused headaches.  She relates that it is working well, though she would like to increase the dose. She is tolerating it well. Still has some "down days" that she thinks will resolve with increased dose. Overall, she is very pleased with the venlafaxine. . Dr. Einar Gip increased her antihypertension regimen, and recommends that we recheck the lipids increase the statin dose if LDL >100. She is not fasting today.  Saw Amber at Dr. Juel Burrow office for annual skin check. The evaluation noted two lesions, SCC on RIGHT lateral lower leg, BCC on the LEFT posterior lower leg. She also developed a local reaction to the Neosporin used on the biopsy wounds.   Review of Systems As above.  Depression screen [facility] 2/9 08/28/2017 07/17/2017 06/02/2017 05/20/2017 05/05/2017  Decreased Interest 0 0 3 2 3   Down, Depressed, Hopeless 1 0 1 2 2   PHQ - 2 Score 1 0 4 4 5   Altered sleeping - - 1 2 2   Tired, decreased energy - - 2 2 2   Change in appetite - - - 3 3  Feeling bad or failure about yourself  - - 1 3 3   Trouble concentrating - - 2 - 0  Moving slowly or fidgety/restless - - 1 1 1   Suicidal thoughts - - 0 0 0  PHQ-9 Score - - 11 15 16   Difficult doing work/chores - - Somewhat difficult Very difficult Very difficult     Patient Active Problem List   Diagnosis Date Noted  . GAD (generalized anxiety disorder) 06/18/2017  . Chronic left-sided low back pain with left-sided sciatica 06/18/2017  . Lymphocytic  colitis 06/18/2017  . Single kidney 05/05/2017  . Depression 05/05/2017  . Eosinophilia 11/27/2016  . H/O sleep apnea 11/27/2016  . Normocalcemic Hyperparathyroidism ([place]) 11/21/2016  . Osteoporosis 11/21/2016  . Allergic rhinitis 03/17/2016  . Multiple nasal polyps 03/17/2016  . Chronic obstructive airway disease with asthma ([place]) 12/24/2015  . Vitamin D Deficiency 02/14/2014  . Other abnormal glucose 02/14/2014  . Encounter for long-term (current) use of other medications 12/13/2013  . Hypertension   . Hyperlipidemia   . Cervical dysplasia   . DUB (dysfunctional uterine bleeding)   . Hypothyroidism 11/30/2007     Prior to Admission medications   Medication Sig Start Date End Date Taking? Authorizing Provider  albuterol (PROAIR HFA) 108 (90 Base) MCG/ACT inhaler Inhale 1-2 puffs into the lungs every 4 (four) hours as needed for wheezing or shortness of breath. 04/09/16  Yes Noralee Space, MD  ALPRAZolam Duanne Moron) 0.5 MG tablet Take 1-2 tablets (0.5-1 mg total) by mouth at bedtime as needed. Sleep. 08/07/17  Yes Valerya Maxton, PA-C  amLODipine (NORVASC) 5 MG tablet TAKE 1/2 TO 1 TABLET DAILY FOR BLOOD PRESSURE 05/05/17  Yes Rondia Higginbotham, PA-C  brimonidine (ALPHAGAN) 0.2 % ophthalmic solution  10/19/16  Yes [provider]  cetirizine (ZYRTEC) 10 MG tablet Take 10 mg by mouth as needed for  allergies.   Yes [provider]  Cholecalciferol (HM VITAMIN D3) 4000 units CAPS Take 1 capsule by mouth daily.   Yes [provider]  co-enzyme Q-10 50 MG capsule Take 50 mg by mouth daily.   Yes [provider]  fluticasone furoate-vilanterol (BREO ELLIPTA) 200-25 MCG/INH AEPB Inhale 1 puff into the lungs daily. 08/10/17  Yes Noralee Space, MD  levothyroxine (SYNTHROID, LEVOTHROID) 100 MCG tablet Take 100 mcg by mouth daily before breakfast.   Yes [provider]  losartan (COZAAR) 50 MG tablet Take 50 mg by mouth daily. 06/17/17  Yes [provider]  pantoprazole (PROTONIX) 40 MG tablet Take 40 mg by mouth daily.   Yes [provider]  Probiotic Product (PROBIOTIC PO) Take 1 tablet by mouth at bedtime.    Yes [provider]  rosuvastatin (CRESTOR) 10 MG tablet Take 10 mg by mouth daily. 06/17/17  Yes [provider]  venlafaxine XR (EFFEXOR-XR) 37.5 MG 24 hr capsule Take 1 capsule (37.5 mg total) by mouth daily with breakfast. Increase to 2 capsules (75 mg) daily in 1-2 weeks. 07/17/17  Yes Harrison Mons, PA-C     Allergies  Allergen Reactions  . Iodine     Pt has only one kidney.  . Iohexol      Code: RASH, Desc: PT STATES SHE HAS ONE KIDNEY SO NOT TO USE IV DYE/10/27/06/RM, Onset Date: 38182993   . Penicillins   . Sulfa Antibiotics Nausea Only  . Neosporin [Neomycin-Polymyxin-Gramicidin] Rash       Objective:  Physical Exam  Constitutional: She is oriented to person, place, and time. She appears well-developed and well-nourished. She is active and cooperative. No distress.  BP 110/72 (BP Location: Left Arm, Patient Position: Sitting, Cuff Size: Normal)   Pulse 94   Temp 98.9 F (37.2 C) (Oral)   Resp 18   Ht 5\' 6"  (1.676 m)   Wt 172 lb 3.2 oz (78.1 kg)   SpO2 97%   BMI 27.79 kg/m   HENT:  Head: Normocephalic and atraumatic.  Right Ear: Hearing normal.  Left Ear: Hearing normal.  Eyes: Conjunctivae are normal. No scleral icterus.  Neck: Normal range of motion. Neck supple. No thyromegaly present.  Cardiovascular: Normal rate, regular rhythm and normal heart sounds.  Pulses:      Radial pulses are 2+ on the right side, and 2+ on the left side.  Pulmonary/Chest: Effort normal and breath sounds normal.  Lymphadenopathy:       Head (right side): No tonsillar, no preauricular, no posterior auricular and no occipital adenopathy present.       Head (left side): No tonsillar, no preauricular, no posterior auricular and no occipital adenopathy present.    She has no cervical  adenopathy.       Right: No supraclavicular adenopathy present.       Left: No supraclavicular adenopathy present.  Neurological: She is alert and oriented to person, place, and time. No sensory deficit.  Skin: Skin is warm, dry and intact. No rash noted. No cyanosis or erythema. Nails show no clubbing.  Psychiatric: She has a normal mood and affect. Her speech is normal and behavior is normal. Judgment and thought content normal. Cognition and memory are normal.           Assessment & Plan:   Problem List Items Addressed This Visit    Hyperlipidemia    Return for fasting labs. If LDL >100, will INCREASE statin dose, per cardiology recommendations.  Relevant Orders   Comprehensive metabolic panel   Lipid panel   Depression    Good results with venlafaxine, but thinks that increasing the dose would resolve her symptoms. Increase dose to 150 mg.       Relevant Medications   venlafaxine XR (EFFEXOR-XR) 150 MG 24 hr capsule   ALPRAZolam (XANAX) 0.5 MG tablet   GAD (generalized anxiety disorder) - Primary    Improved with venlafaxine, but thinks could resolve with increased dose. Increase venlafaxine to 150 mg.      Relevant Medications   venlafaxine XR (EFFEXOR-XR) 150 MG 24 hr capsule   ALPRAZolam (XANAX) 0.5 MG tablet    Other Visit Diagnoses    Need for influenza vaccination       Relevant Orders   Flu Vaccine QUAD 36+ mos IM (Completed)   Need for hepatitis C screening test       Relevant Orders   Hepatitis C antibody (Completed)   Need for pneumococcal vaccination       Relevant Orders   Pneumococcal conjugate vaccine 13-valent IM (Completed)       Return in about 6 months (around 02/25/2018) for re-evalaution of mood, BP, lipids; sooner pending fasting lab results.   Fara Chute, PA-C [facility] [place]

## 2017-08-28 NOTE — Patient Instructions (Signed)
     IF you received an x-ray today, you will receive an invoice from [facility]. Please contact [facility] at [phone] with questions or concerns regarding your invoice.   IF you received labwork today, you will receive an invoice from [facility]. Please contact [facility] at [phone] with questions or concerns regarding your invoice.   Our billing staff will not be able to assist you with questions regarding bills from these companies.  You will be contacted with the lab results as soon as they are available. The fastest way to get your results is to activate your My Chart account. Instructions are located on the last page of this paperwork. If you have not heard from us regarding the results in 2 weeks, please contact this office.     

## 2017-08-29 ENCOUNTER — Encounter: Payer: Self-pay | Admitting: Physician Assistant

## 2017-08-29 LAB — HEPATITIS C ANTIBODY: Hep C Virus Ab: <0.1 s/co ratio (ref 0.0–0.9)

## 2017-08-29 NOTE — Assessment & Plan Note (Signed)
Return for fasting labs. If LDL >100, will INCREASE statin dose, per cardiology recommendations.

## 2017-08-29 NOTE — Assessment & Plan Note (Signed)
Good results with venlafaxine, but thinks that increasing the dose would resolve her symptoms. Increase dose to 150 mg.

## 2017-08-29 NOTE — Assessment & Plan Note (Signed)
Improved with venlafaxine, but thinks could resolve with increased dose. Increase venlafaxine to 150 mg.

## 2017-09-05 ENCOUNTER — Other Ambulatory Visit: Payer: Self-pay | Admitting: Physician Assistant

## 2017-09-05 DIAGNOSIS — I10 Essential (primary) hypertension: Secondary | ICD-10-CM

## 2017-09-17 DIAGNOSIS — L570 Actinic keratosis: Secondary | ICD-10-CM | POA: Diagnosis not present

## 2017-09-17 DIAGNOSIS — Z85828 Personal history of other malignant neoplasm of skin: Secondary | ICD-10-CM | POA: Diagnosis not present

## 2017-09-17 DIAGNOSIS — X32XXXD Exposure to sunlight, subsequent encounter: Secondary | ICD-10-CM | POA: Diagnosis not present

## 2017-09-17 DIAGNOSIS — Z08 Encounter for follow-up examination after completed treatment for malignant neoplasm: Secondary | ICD-10-CM | POA: Diagnosis not present

## 2017-09-17 DIAGNOSIS — B078 Other viral warts: Secondary | ICD-10-CM | POA: Diagnosis not present

## 2017-09-25 DIAGNOSIS — H01111 Allergic dermatitis of right upper eyelid: Secondary | ICD-10-CM | POA: Diagnosis not present

## 2017-10-08 ENCOUNTER — Other Ambulatory Visit: Payer: Self-pay | Admitting: Physician Assistant

## 2017-10-08 ENCOUNTER — Telehealth: Payer: Self-pay | Admitting: Physician Assistant

## 2017-10-08 DIAGNOSIS — F32A Depression, unspecified: Secondary | ICD-10-CM

## 2017-10-08 DIAGNOSIS — F329 Major depressive disorder, single episode, unspecified: Secondary | ICD-10-CM

## 2017-10-08 NOTE — Telephone Encounter (Signed)
Copied from [place]. Topic: Quick Communication - See Telephone Encounter >> Oct 08, 2017  5:47 PM Corie Chiquito, [place] wrote: CRM for notification. See Telephone encounter for: Patient calling because she needs a refill on her Xanax. If someone could give her a call back about this at [phone]  10/08/17.

## 2017-10-08 NOTE — Telephone Encounter (Signed)
Error-Close Encounter 

## 2017-10-08 NOTE — Telephone Encounter (Signed)
Last refill and OV on 08/28/17.

## 2017-10-09 NOTE — Telephone Encounter (Signed)
Please advise 

## 2017-10-09 NOTE — Telephone Encounter (Signed)
Done via Refill Request.

## 2017-10-09 NOTE — Telephone Encounter (Signed)
Request for Xanax. Last office 08/28/17.

## 2017-10-12 ENCOUNTER — Ambulatory Visit: Payer: Self-pay | Admitting: *Deleted

## 2017-10-12 NOTE — Telephone Encounter (Signed)
Pt states her neck hurts, which started at her upper back on the right. She believes that it could be stress and also just sitting and driving in a car over this past week for a long period of time.  She also states she is having chills at times, which could be from the pain she is having. Denies having a fever at this time. Home care advice given to her and an appointment made for Wednesday.  Reason for Disposition . [1] MODERATE neck pain (e.g., interferes with normal activities AND [2] present > 3 days  Answer Assessment - Initial Assessment Questions 1. ONSET: "When did the pain begin?"      yesterday 2. LOCATION: "Where does it hurt?"      Started on right shoulder and goes up to the head 3. PATTERN "Does the pain come and go, or has it been constant since it started?"      Pain constant 4. SEVERITY: "How bad is the pain?"  (Scale 1-10; or mild, moderate, severe)   - MILD (1-3): doesn't interfere with normal activities    - MODERATE (4-7): interferes with normal activities or awakens from sleep    - SEVERE (8-10):  excruciating pain, unable to do any normal activities      10 last night, 4 or 5 today 5. RADIATION: "Does the pain go anywhere else, shoot into your arms?"     Goes up into the neck and last night shooting to the right side of head 6. CORD SYMPTOMS: "Any weakness or numbness of the arms or legs?"     Tingling in right hand but not constant 7. CAUSE: "What do you think is causing the neck pain?"     Degenerative disc disease and stress? 8. NECK OVERUSE: "Any recent activities that involved turning or twisting the neck?"     no 9. OTHER SYMPTOMS: "Do you have any other symptoms?" (e.g., headache, fever, chest pain, difficulty breathing, neck swelling)     Headache, chills 10. PREGNANCY: "Is there any chance you are pregnant?" "When was your last menstrual period?"       no  Protocols used: NECK PAIN OR STIFFNESS-A-AH

## 2017-10-14 ENCOUNTER — Encounter: Payer: Self-pay | Admitting: Physician Assistant

## 2017-10-14 ENCOUNTER — Ambulatory Visit (INDEPENDENT_AMBULATORY_CARE_PROVIDER_SITE_OTHER): Payer: Medicare Other | Admitting: Physician Assistant

## 2017-10-14 ENCOUNTER — Other Ambulatory Visit: Payer: Self-pay

## 2017-10-14 VITALS — BP 112/80 | HR 85 | Temp 98.5°F | Resp 18 | Ht 66.0 in | Wt 170.8 lb

## 2017-10-14 DIAGNOSIS — M542 Cervicalgia: Secondary | ICD-10-CM

## 2017-10-14 MED ORDER — CYCLOBENZAPRINE HCL 10 MG PO TABS: 5.0000 mg | ORAL_TABLET | Freq: Three times a day (TID) | ORAL | 0 refills | Status: DC | PRN

## 2017-10-14 NOTE — Patient Instructions (Addendum)
Continue the cautious use of the ibuprofen. Continue using the heating pad.  Come in for fasting labs at your convenience in the next several weeks. The orders are in!    IF you received an x-ray today, you will receive an invoice from [facility]. Please contact [facility] at [phone] with questions or concerns regarding your invoice.   IF you received labwork today, you will receive an invoice from [place]. Please contact [facility] at [phone] with questions or concerns regarding your invoice.   Our billing staff will not be able to assist you with questions regarding bills from these companies.  You will be contacted with the lab results as soon as they are available. The fastest way to get your results is to activate your My Chart account. Instructions are located on the last page of this paperwork. If you have not heard from [place] regarding the results in 2 weeks, please contact this office.
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
## 2017-10-20 ENCOUNTER — Ambulatory Visit: Payer: Medicare Other | Admitting: Physician Assistant

## 2017-10-20 ENCOUNTER — Ambulatory Visit (INDEPENDENT_AMBULATORY_CARE_PROVIDER_SITE_OTHER): Payer: Medicare Other | Admitting: Family Medicine

## 2017-10-20 DIAGNOSIS — E782 Mixed hyperlipidemia: Secondary | ICD-10-CM | POA: Diagnosis not present

## 2017-10-20 NOTE — Progress Notes (Signed)
Lab visit only. 

## 2017-10-21 LAB — LIPID PANEL
CHOL/HDL RATIO: 2.6 ratio (ref 0.0–4.4)
Cholesterol, Total: 128 mg/dL (ref 100–199)
HDL: 50 mg/dL (ref >39)
LDL Calculated: 57 mg/dL (ref 0–99)
Triglycerides: 107 mg/dL (ref 0–149)
VLDL CHOLESTEROL CAL: 21 mg/dL (ref 5–40)

## 2017-10-21 LAB — COMPREHENSIVE METABOLIC PANEL
ALK PHOS: 58 IU/L (ref 39–117)
ALT: 21 IU/L (ref 0–32)
AST: 18 IU/L (ref 0–40)
Albumin/Globulin Ratio: 2 (ref 1.2–2.2)
Albumin: 4.4 g/dL (ref 3.5–4.8)
BILIRUBIN TOTAL: 0.3 mg/dL (ref 0.0–1.2)
BUN/Creatinine Ratio: 19 (ref 12–28)
BUN: 18 mg/dL (ref 8–27)
CHLORIDE: 106 mmol/L (ref 96–106)
CO2: 21 mmol/L (ref 20–29)
Calcium: 9.3 mg/dL (ref 8.7–10.3)
Creatinine, Ser: 0.93 mg/dL (ref 0.57–1.00)
GFR calc Af Amer: 72 mL/min/{1.73_m2} (ref >59)
GFR calc non Af Amer: 62 mL/min/{1.73_m2} (ref >59)
GLUCOSE: 96 mg/dL (ref 65–99)
Globulin, Total: 2.2 g/dL (ref 1.5–4.5)
Potassium: 4.6 mmol/L (ref 3.5–5.2)
Sodium: 141 mmol/L (ref 134–144)
Total Protein: 6.6 g/dL (ref 6.0–8.5)

## 2017-10-26 DIAGNOSIS — H401131 Primary open-angle glaucoma, bilateral, mild stage: Secondary | ICD-10-CM | POA: Diagnosis not present

## 2017-11-06 ENCOUNTER — Other Ambulatory Visit: Payer: Self-pay | Admitting: Physician Assistant

## 2017-11-06 DIAGNOSIS — F32A Depression, unspecified: Secondary | ICD-10-CM

## 2017-11-06 DIAGNOSIS — F329 Major depressive disorder, single episode, unspecified: Secondary | ICD-10-CM

## 2017-11-06 NOTE — Telephone Encounter (Signed)
Xanax refill.  LOV: 08/28/17 with Chelle Jeffrey,PA Last refill 10/09/17 with 60 tabs Next OV: 02/23/18.

## 2017-11-06 NOTE — Telephone Encounter (Signed)
Please advise 

## 2017-11-09 ENCOUNTER — Telehealth: Payer: Self-pay | Admitting: Physician Assistant

## 2017-11-09 NOTE — Telephone Encounter (Signed)
Copied from [place] [phone]. Topic: General - Other >> Nov 09, 2017 11:16 AM Darl Householder, RMA wrote: Reason for CRM: Medication refill request for Alprazolam 0.5 mg to be sent to [facility], pt is completely out of medication

## 2017-11-10 NOTE — Telephone Encounter (Signed)
Pt is calling back for an update, adv pt refill request is still pending with Chelle

## 2017-11-10 NOTE — Telephone Encounter (Signed)
Rx sent electronically.  Meds ordered this encounter  Medications  . ALPRAZolam (XANAX) 0.5 MG tablet    Sig: TAKE 1 TO 2 TABLETS BY MOUTH EVERY DAY AT BEDTIME AS NEEDED FOR SLEEP    Dispense:  60 tablet    Refill:  0    Not to exceed 5 additional fills before 04/07/2018 DX Code Needed  .

## 2017-11-10 NOTE — Telephone Encounter (Signed)
Addressed in other message thread.

## 2017-11-10 NOTE — Telephone Encounter (Signed)
Please see note below. 

## 2017-11-24 ENCOUNTER — Ambulatory Visit (INDEPENDENT_AMBULATORY_CARE_PROVIDER_SITE_OTHER): Payer: Medicare Other | Admitting: Physician Assistant

## 2017-11-24 ENCOUNTER — Encounter: Payer: Self-pay | Admitting: Physician Assistant

## 2017-11-24 ENCOUNTER — Other Ambulatory Visit: Payer: Self-pay

## 2017-11-24 VITALS — BP 132/72 | HR 103 | Temp 98.0°F | Resp 16 | Ht 65.75 in | Wt 169.0 lb

## 2017-11-24 DIAGNOSIS — F329 Major depressive disorder, single episode, unspecified: Secondary | ICD-10-CM | POA: Diagnosis not present

## 2017-11-24 DIAGNOSIS — E782 Mixed hyperlipidemia: Secondary | ICD-10-CM

## 2017-11-24 DIAGNOSIS — F32A Depression, unspecified: Secondary | ICD-10-CM

## 2017-11-24 DIAGNOSIS — F411 Generalized anxiety disorder: Secondary | ICD-10-CM

## 2017-11-24 NOTE — Assessment & Plan Note (Signed)
Well controlled with venlafaxine er 150 mg daily. Encouraged increased physical activity and activities to reduce boredom.

## 2017-11-24 NOTE — Patient Instructions (Addendum)
Consider adding more exercise to your routine. Schedule it daily so that you go! It can be as simple as an extra walk with the dogs, or joining a gym.    IF you received an x-ray today, you will receive an invoice from [facility]. Please contact [facility] at [phone] with questions or concerns regarding your invoice.   IF you received labwork today, you will receive an invoice from [place]. Please contact [facility] at [phone] with questions or concerns regarding your invoice.   Our billing staff will not be able to assist you with questions regarding bills from these companies.  You will be contacted with the lab results as soon as they are available. The fastest way to get your results is to activate your My Chart account. Instructions are located on the last page of this paperwork. If you have not heard from [place] regarding the results in 2 weeks, please contact this office.

## 2017-11-24 NOTE — Assessment & Plan Note (Signed)
Very well controlled. Continue current regimen.

## 2017-11-24 NOTE — Progress Notes (Signed)
Patient ID: [name] [name], female    DOB: 1945/11/15, 72 y.o.   MRN: [id number]  PCP: Harrison Mons, PA-C  Chief Complaint  Patient presents with  . Chronic Conditions    3 month follow-up     Subjective:   Presents for evaluation of anxiety/depression and to review labs drawn at her visit 4 weeks ago. She is accompanied by her daughter, [name], who is also my patient and is needing help with a prior authorization for [facility].  Has not checked her results in My Chart, so wants to know the results of the lipids and CMET. Both were normal, LDL 57!  Antidepressant treatment is working well, except some days she experiences a mid-afternoon "downer." Eats her first meal at 11-11:30 am, and then takes her medications. Then around 4-5 pm, gets the feeling "That I need something, to boost me back again." Occurring x 2 weeks. Not irritable, but "It's almost to that point. And I have to step back and think about what's happening to me." "The least little thing, and I snap."  Some hot flashes. Day and nighttime, but only 2-3 times/week.  Review of Systems As above.    Patient Active Problem List   Diagnosis Date Noted  . GAD (generalized anxiety disorder) 06/18/2017  . Chronic left-sided low back pain with left-sided sciatica 06/18/2017  . Lymphocytic colitis 06/18/2017  . Single kidney 05/05/2017  . Depression 05/05/2017  . Eosinophilia 11/27/2016  . H/O sleep apnea 11/27/2016  . Normocalcemic Hyperparathyroidism ([place]) 11/21/2016  . Osteoporosis 11/21/2016  . Allergic rhinitis 03/17/2016  . Multiple nasal polyps 03/17/2016  . Chronic obstructive airway disease with asthma ([place]) 12/24/2015  . Vitamin D Deficiency 02/14/2014  . Other abnormal glucose 02/14/2014  . Encounter for long-term (current) use of other medications 12/13/2013  . Hypertension   . Hyperlipidemia   . Cervical dysplasia   . DUB (dysfunctional uterine bleeding)   . Hypothyroidism 11/30/2007      Prior to Admission medications   Medication Sig Start Date End Date Taking? Authorizing Provider  albuterol (PROAIR HFA) 108 (90 Base) MCG/ACT inhaler Inhale 1-2 puffs into the lungs every 4 (four) hours as needed for wheezing or shortness of breath. 04/09/16  Yes Noralee Space, MD  ALPRAZolam Duanne Moron) 0.5 MG tablet TAKE 1 TO 2 TABLETS BY MOUTH EVERY DAY AT BEDTIME AS NEEDED FOR SLEEP 11/10/17  Yes Rhea Thrun, PA-C  amLODipine (NORVASC) 5 MG tablet TAKE 1/2 TO 1 TABLET DAILY FOR BLOOD PRESSURE 09/05/17  Yes Hiren Peplinski, PA-C  brimonidine (ALPHAGAN) 0.2 % ophthalmic solution  10/19/16  Yes [provider]  cetirizine (ZYRTEC) 10 MG tablet Take 10 mg by mouth as needed for allergies.   Yes [provider]  Cholecalciferol (HM VITAMIN D3) 4000 units CAPS Take 1 capsule by mouth daily.   Yes [provider]  co-enzyme Q-10 50 MG capsule Take 50 mg by mouth daily.   Yes [provider]  cyclobenzaprine (FLEXERIL) 10 MG tablet Take 0.5-1 tablets (5-10 mg total) by mouth 3 (three) times daily as needed for muscle spasms. 10/14/17  Yes Natina Wiginton, PA-C  fluticasone furoate-vilanterol (BREO ELLIPTA) 200-25 MCG/INH AEPB Inhale 1 puff into the lungs daily. 08/10/17  Yes Noralee Space, MD  levothyroxine (SYNTHROID, LEVOTHROID) 100 MCG tablet Take 100 mcg by mouth daily before breakfast.   Yes [provider]  losartan (COZAAR) 50 MG tablet Take 50 mg by mouth daily. 06/17/17  Yes [provider]  pantoprazole (PROTONIX) 40 MG tablet Take 40 mg by mouth daily.   Yes [provider]  Probiotic Product (PROBIOTIC PO) Take 1 tablet by mouth at bedtime.    Yes [provider]  rosuvastatin (CRESTOR) 10 MG tablet Take 10 mg by mouth daily. 06/17/17  Yes [provider]  venlafaxine XR (EFFEXOR-XR) 150 MG 24 hr capsule Take 1 capsule (150 mg total) daily with breakfast by mouth. 08/28/17  Yes Khaylee Mcevoy, PA-C      Allergies  Allergen Reactions  . Iodine     Pt has only one kidney.  . Iohexol      Code: RASH, Desc: PT STATES SHE HAS ONE KIDNEY SO NOT TO USE IV DYE/10/27/06/RM, Onset Date: 89169450   . Penicillins   . Sulfa Antibiotics Nausea Only  . Neosporin [Neomycin-Polymyxin-Gramicidin] Rash       Objective:  Physical Exam  Constitutional: She is oriented to person, place, and time. She appears well-developed and well-nourished. She is active and cooperative. No distress.  BP 132/72   Pulse (!) 103   Temp 98 F (36.7 C) (Oral)   Resp 16   Ht 5' 5.75" (1.67 m)   Wt 169 lb (76.7 kg)   SpO2 96%   BMI 27.49 kg/m    Eyes: Conjunctivae are normal.  Pulmonary/Chest: Effort normal.  Neurological: She is alert and oriented to person, place, and time.  Psychiatric: She has a normal mood and affect. Her speech is normal and behavior is normal.       Assessment & Plan:   Problem List Items Addressed This Visit    Hyperlipidemia    Very well controlled. Continue current regimen.      Depression    Well controlled with venlafaxine er 150 mg daily. Encouraged increased physical activity and activities to reduce boredom.      GAD (generalized anxiety disorder) - Primary    Well controlled with venlafaxine er 150 mg daily. Encouraged increased physical activity and activities to reduce boredom.          Return in about 6 months (around 05/24/2018) for re-evalaution of cholesterol, mood.   Fara Chute, PA-C [facility] [place]

## 2017-12-07 ENCOUNTER — Other Ambulatory Visit: Payer: Self-pay | Admitting: Physician Assistant

## 2017-12-07 DIAGNOSIS — F329 Major depressive disorder, single episode, unspecified: Secondary | ICD-10-CM

## 2017-12-07 DIAGNOSIS — F32A Depression, unspecified: Secondary | ICD-10-CM

## 2017-12-07 NOTE — Telephone Encounter (Signed)
LOV: 11/24/17 Daphane Shepherd, PA [facility]

## 2017-12-07 NOTE — Telephone Encounter (Signed)
Rx sent electronically.  Meds ordered this encounter  Medications  . ALPRAZolam (XANAX) 0.5 MG tablet    Sig: TAKE 1 TO 2 TABLETS BY MOUTH EVERY DAY AT BEDTIME AS NEEDED FOR SLEEP    Dispense:  60 tablet    Refill:  0    Not to exceed 5 additional fills before 05/09/2018 DX Code Needed  .

## 2017-12-08 ENCOUNTER — Other Ambulatory Visit: Payer: Self-pay

## 2017-12-08 ENCOUNTER — Ambulatory Visit (INDEPENDENT_AMBULATORY_CARE_PROVIDER_SITE_OTHER): Payer: Medicare Other | Admitting: Physician Assistant

## 2017-12-08 ENCOUNTER — Encounter: Payer: Self-pay | Admitting: Physician Assistant

## 2017-12-08 ENCOUNTER — Ambulatory Visit (INDEPENDENT_AMBULATORY_CARE_PROVIDER_SITE_OTHER): Payer: Medicare Other

## 2017-12-08 VITALS — BP 126/84 | HR 85 | Temp 98.4°F | Resp 16 | Ht 65.75 in | Wt 169.2 lb

## 2017-12-08 DIAGNOSIS — M5441 Lumbago with sciatica, right side: Secondary | ICD-10-CM

## 2017-12-08 DIAGNOSIS — G8929 Other chronic pain: Secondary | ICD-10-CM | POA: Diagnosis not present

## 2017-12-08 DIAGNOSIS — M5489 Other dorsalgia: Secondary | ICD-10-CM

## 2017-12-08 DIAGNOSIS — M5442 Lumbago with sciatica, left side: Secondary | ICD-10-CM

## 2017-12-08 DIAGNOSIS — M545 Low back pain: Secondary | ICD-10-CM | POA: Diagnosis not present

## 2017-12-08 LAB — POCT URINALYSIS DIP (MANUAL ENTRY)
Bilirubin, UA: NEGATIVE
GLUCOSE UA: NEGATIVE mg/dL
Ketones, POC UA: NEGATIVE mg/dL
NITRITE UA: NEGATIVE
PROTEIN UA: NEGATIVE mg/dL
RBC UA: NEGATIVE
Spec Grav, UA: 1.015 (ref 1.010–1.025)
UROBILINOGEN UA: 0.2 U/dL
pH, UA: 6 (ref 5.0–8.0)

## 2017-12-08 NOTE — Progress Notes (Signed)
Patient ID: [name] [name], female    DOB: 10/31/1945, 72 y.o.   MRN: [id number]  PCP: Harrison Mons, PA-C  Chief Complaint  Patient presents with  . Back Pain    Subjective:   Presents for evaluation of low back pain.  Chronic back pain with LEFT leg sciatica. Annual IV infusion of Reclast for osteoporosis. Insurance didn't authorize Prolia.  Yesterday morning awoke about 4 am with worsening pain. This time radiates into the RIGHT groin and down the leg. Ibuprofen helped a little bit. Didn't take many of the muscle relaxers from last time, so had leftover, and took it TID, with some benefit. Slept well last night. Today, the pain is less, "a tiny throb," tenderness. Thinks that she may need a specialist. If she needs neurosurgery, she'd like to see Dr. Sherwood Gambler, who sees her sister-in-law (required surgery for back problem that limited her ability to ambulate). "I can feel knots."  Previous acupuncture and massage therapy have helped. The practitioner no longer accepts her insurance, and she cannot afford to pay OOP.  No loss of bowel/bladder control. No urinary urgency, frequency or burning. No saddle anesthesia. No leg weakness or paresthesias.  Had a UTI in 06/2017. Desires UA today.    Review of Systems As above.    Patient Active Problem List   Diagnosis Date Noted  . GAD (generalized anxiety disorder) 06/18/2017  . Chronic left-sided low back pain with left-sided sciatica 06/18/2017  . Lymphocytic colitis 06/18/2017  . Single kidney 05/05/2017  . Depression 05/05/2017  . Eosinophilia 11/27/2016  . H/O sleep apnea 11/27/2016  . Normocalcemic Hyperparathyroidism ([place]) 11/21/2016  . Osteoporosis 11/21/2016  . Allergic rhinitis 03/17/2016  . Multiple nasal polyps 03/17/2016  . Chronic obstructive airway disease with asthma ([place]) 12/24/2015  . Vitamin D Deficiency 02/14/2014  . Other abnormal glucose 02/14/2014  . Encounter for long-term (current)  use of other medications 12/13/2013  . Hypertension   . Hyperlipidemia   . Cervical dysplasia   . DUB (dysfunctional uterine bleeding)   . Hypothyroidism 11/30/2007     Prior to Admission medications   Medication Sig Start Date End Date Taking? Authorizing Provider  albuterol (PROAIR HFA) 108 (90 Base) MCG/ACT inhaler Inhale 1-2 puffs into the lungs every 4 (four) hours as needed for wheezing or shortness of breath. 04/09/16  Yes Noralee Space, MD  ALPRAZolam Duanne Moron) 0.5 MG tablet TAKE 1 TO 2 TABLETS BY MOUTH EVERY DAY AT BEDTIME AS NEEDED FOR SLEEP 12/07/17  Yes Quinn Bartling, PA-C  amLODipine (NORVASC) 5 MG tablet TAKE 1/2 TO 1 TABLET DAILY FOR BLOOD PRESSURE 09/05/17  Yes Basia Mcginty, PA-C  brimonidine (ALPHAGAN) 0.2 % ophthalmic solution  10/19/16  Yes [provider]  cetirizine (ZYRTEC) 10 MG tablet Take 10 mg by mouth as needed for allergies.   Yes [provider]  Cholecalciferol (HM VITAMIN D3) 4000 units CAPS Take 1 capsule by mouth daily.   Yes [provider]  co-enzyme Q-10 50 MG capsule Take 50 mg by mouth daily.   Yes [provider]  cyclobenzaprine (FLEXERIL) 10 MG tablet Take 0.5-1 tablets (5-10 mg total) by mouth 3 (three) times daily as needed for muscle spasms. 10/14/17  Yes Daanya Lanphier, PA-C  fluticasone furoate-vilanterol (BREO ELLIPTA) 200-25 MCG/INH AEPB Inhale 1 puff into the lungs daily. 08/10/17  Yes Noralee Space, MD  levothyroxine (SYNTHROID, LEVOTHROID) 100 MCG tablet Take 100 mcg by mouth daily before breakfast.   Yes [provider]  losartan (COZAAR) 50 MG tablet Take 50 mg by mouth daily. 06/17/17  Yes [provider]  pantoprazole (PROTONIX) 40 MG tablet Take 40 mg by mouth daily.   Yes [provider]  Probiotic Product (PROBIOTIC PO) Take 1 tablet by mouth at bedtime.    Yes [provider]  rosuvastatin (CRESTOR) 10 MG tablet Take 10 mg by mouth daily. 06/17/17  Yes [provider]  venlafaxine XR (EFFEXOR-XR) 150 MG 24 hr capsule Take 1 capsule (150 mg total) daily with breakfast by mouth. 08/28/17  Yes Imraan Wendell, PA-C     Allergies  Allergen Reactions  . Iodine     Pt has only one kidney.  . Iohexol      Code: RASH, Desc: PT STATES SHE HAS ONE KIDNEY SO NOT TO USE IV DYE/10/27/06/RM, Onset Date: [id number]   . Penicillins   . Sulfa Antibiotics Nausea Only  . Neosporin [Neomycin-Polymyxin-Gramicidin] Rash       Objective:  Physical Exam  Constitutional: She is oriented to person, place, and time. She appears well-developed and well-nourished. She is active and cooperative. No distress.  BP 126/84   Pulse 85   Temp 98.4 F (36.9 C)   Resp 16   Ht 5' 5.75" (1.67 m)   Wt 169 lb 3.2 oz (76.7 kg)   SpO2 95%   BMI 27.52 kg/m   HENT:  Head: Normocephalic and atraumatic.  Right Ear: Hearing normal.  Left Ear: Hearing normal.  Eyes: Conjunctivae are normal. No scleral icterus.  Neck: Normal range of motion. Neck supple. No thyromegaly present.  Cardiovascular: Normal rate, regular rhythm and normal heart sounds.  Pulses:      Radial pulses are 2+ on the right side, and 2+ on the left side.  Pulmonary/Chest: Effort normal and breath sounds normal.  Musculoskeletal:       Lumbar back: She exhibits bony tenderness, pain and spasm. She exhibits normal range of motion, no tenderness, no swelling, no edema, no deformity, no laceration and normal pulse.  Lymphadenopathy:       Head (right side): No tonsillar, no preauricular, no posterior auricular and no occipital adenopathy present.       Head (left side): No tonsillar, no preauricular, no posterior auricular and no occipital adenopathy present.    She has no cervical adenopathy.       Right: No supraclavicular adenopathy present.       Left: No supraclavicular adenopathy present.  Neurological: She is alert and oriented to person, place, and time. She has normal strength. No sensory  deficit.  Reflex Scores:      Patellar reflexes are 2+ on the right side and 2+ on the left side.      Achilles reflexes are 2+ on the right side and 2+ on the left side. Skin: Skin is warm, dry and intact. No rash noted. No cyanosis or erythema. Nails show no clubbing.  Psychiatric: She has a normal mood and affect. Her speech is normal and behavior is normal.       Assessment & Plan:  1. Acute right-sided low back pain with right-sided sciatica 2. Chronic left-sided low back pain with left-sided sciatica Update radiographs today, as we have none on file. Resume PT and acupuncture (contact information for [facility] provided. Could also file herself for services provided by her previous practitioner, or ask me for alternative names for PT). Minimize NSAIDS, given single-kidney status. Continue muscle relaxer as needed. - DG Lumbar Spine  Complete; Future - Urine Culture    Return for re-evaluation as previously planned.   Fara Chute, PA-C [facility] [place]

## 2017-12-08 NOTE — Patient Instructions (Addendum)
[facility] [phone]   IF you received an x-ray today, you will receive an invoice from [facility]. Please contact [facility] at [phone] with questions or concerns regarding your invoice.   IF you received labwork today, you will receive an invoice from [facility]. Please contact [facility] at [phone] with questions or concerns regarding your invoice.   Our billing staff will not be able to assist you with questions regarding bills from these companies.  You will be contacted with the lab results as soon as they are available. The fastest way to get your results is to activate your My Chart account. Instructions are located on the last page of this paperwork. If you have not heard from [place] regarding the results in 2 weeks, please contact this office.

## 2017-12-08 NOTE — Assessment & Plan Note (Signed)
Update radiographs, as we have have none on file. Continue with muscle relaxer, and limit NSAIDS. Recommend [facility] for acupuncture and massage therapy.

## 2017-12-09 LAB — URINE CULTURE

## 2017-12-24 ENCOUNTER — Other Ambulatory Visit: Payer: Self-pay | Admitting: Physician Assistant

## 2017-12-24 NOTE — Telephone Encounter (Signed)
LOV 12-08-17 with Harrison Mons. In OV note on 05/05/17 to establish care, no mention of this medication / No recent TSH on file / Refill request for synthroid, but it is listed as a historical medication / Is a refill appropriate?

## 2017-12-30 ENCOUNTER — Other Ambulatory Visit: Payer: Self-pay | Admitting: Physician Assistant

## 2017-12-30 DIAGNOSIS — I10 Essential (primary) hypertension: Secondary | ICD-10-CM

## 2017-12-30 DIAGNOSIS — F329 Major depressive disorder, single episode, unspecified: Secondary | ICD-10-CM

## 2017-12-30 DIAGNOSIS — F32A Depression, unspecified: Secondary | ICD-10-CM

## 2017-12-31 ENCOUNTER — Other Ambulatory Visit: Payer: Self-pay | Admitting: Physician Assistant

## 2017-12-31 DIAGNOSIS — Z139 Encounter for screening, unspecified: Secondary | ICD-10-CM

## 2017-12-31 NOTE — Telephone Encounter (Signed)
Levothyroxine refill Last OV: no note in chart Last Refill: unknown- "historical provider" [facility] PCP: Harrison Mons PA  Alprazolam refill Last OV: 11/24/17 Last Refill:12/07/17 [facility] PCP: Harrison Mons PA

## 2018-01-01 ENCOUNTER — Other Ambulatory Visit: Payer: Self-pay | Admitting: Physician Assistant

## 2018-01-01 DIAGNOSIS — I10 Essential (primary) hypertension: Secondary | ICD-10-CM

## 2018-01-01 NOTE — Telephone Encounter (Signed)
Pt also needs  amLODipine (NORVASC) 5 MG tablet  Pt is out of her meds now. Has been waiting on all these refills asap  [id number] [place] [address] [phone] (Phone) [phone] (Fax)    chelle is not in the office until Tuesday and now pt is out of her meds.  This was requested 12/30/17

## 2018-01-01 NOTE — Telephone Encounter (Signed)
Patient is requesting a refill of the following medications: Requested Prescriptions   Pending Prescriptions Disp Refills  . levothyroxine (SYNTHROID, LEVOTHROID) 100 MCG tablet [Pharmacy Med Name: LEVOTHYROXINE 100 MCG TABLET] 90 tablet 1    Sig: TAKE 1 TABLET EVERY DAY  . ALPRAZolam (XANAX) 0.5 MG tablet [Pharmacy Med Name: ALPRAZOLAM 0.5 MG TABLET] 60 tablet 0    Sig: TAKE 1 TO 2 TABLETS BY MOUTH EVERY DAY AT BEDTIME AS NEEDED FOR SLEEP    Date of patient request: 12/31/17 Last office visit: 11/24/17 Date of last refill: 12/07/17 Last refill amount: #60 0RF Follow up time period per chart: 02/23/18

## 2018-01-01 NOTE — Telephone Encounter (Signed)
Called pt to schedule an office visit for med refill. No answer, message left for the patient to call back.

## 2018-01-01 NOTE — Telephone Encounter (Signed)
Refill of Xanax  Norvasc  Synthroid (Last TSH 04/17/15)  LOV 12/08/17  C. Jeffery  [id number] [place] [address] [address]

## 2018-01-04 ENCOUNTER — Telehealth: Payer: Self-pay | Admitting: Physician Assistant

## 2018-01-04 ENCOUNTER — Other Ambulatory Visit: Payer: Self-pay | Admitting: Physician Assistant

## 2018-01-04 MED ORDER — AMLODIPINE BESYLATE 5 MG PO TABS: 2.5000 mg | ORAL_TABLET | Freq: Every day | ORAL | 1 refills | Status: DC

## 2018-01-04 NOTE — Telephone Encounter (Signed)
Unclear to me why this patient was advised she needed a visit for refills. At her visit in February, she was advised to RTC in 6 months (August).  Sent amlodipine and levothyroxine.

## 2018-01-04 NOTE — Telephone Encounter (Signed)
Copied from [place] [id number]. Topic: Quick Communication - See Telephone Encounter >> Jan 04, 2018 11:58 AM Lolita Rieger, RMA wrote: CRM for notification. See Telephone encounter for: 01/04/18.pt called and stated that she will be out of her B/P meds and her levothyroxine before her appt and she wanted to know what can be done. Pt has appt on 01/15/18 she called to see if she can be seen by another physician or if refills can be called in before appt

## 2018-01-04 NOTE — Telephone Encounter (Signed)
Patient needs office visit for further refills

## 2018-01-04 NOTE — Telephone Encounter (Signed)
We cannot over book provider and only have same day slots left. We cannot use same day slots for med refills only for acute visits. °

## 2018-01-05 DIAGNOSIS — R1033 Periumbilical pain: Secondary | ICD-10-CM | POA: Diagnosis not present

## 2018-01-05 DIAGNOSIS — R110 Nausea: Secondary | ICD-10-CM | POA: Diagnosis not present

## 2018-01-05 DIAGNOSIS — K219 Gastro-esophageal reflux disease without esophagitis: Secondary | ICD-10-CM | POA: Diagnosis not present

## 2018-01-07 ENCOUNTER — Other Ambulatory Visit: Payer: Self-pay | Admitting: Physician Assistant

## 2018-01-07 DIAGNOSIS — F32A Depression, unspecified: Secondary | ICD-10-CM

## 2018-01-07 DIAGNOSIS — F329 Major depressive disorder, single episode, unspecified: Secondary | ICD-10-CM

## 2018-01-07 MED ORDER — ALPRAZOLAM 0.5 MG PO TABS: 0.5000 mg | ORAL_TABLET | Freq: Every evening | ORAL | 0 refills | Status: DC | PRN

## 2018-01-07 NOTE — Telephone Encounter (Signed)
Med refill req  Xanax sent to [facility]

## 2018-01-14 DIAGNOSIS — L308 Other specified dermatitis: Secondary | ICD-10-CM | POA: Diagnosis not present

## 2018-01-14 DIAGNOSIS — Z85820 Personal history of malignant melanoma of skin: Secondary | ICD-10-CM | POA: Diagnosis not present

## 2018-01-14 DIAGNOSIS — Z1283 Encounter for screening for malignant neoplasm of skin: Secondary | ICD-10-CM | POA: Diagnosis not present

## 2018-01-14 DIAGNOSIS — Z08 Encounter for follow-up examination after completed treatment for malignant neoplasm: Secondary | ICD-10-CM | POA: Diagnosis not present

## 2018-01-15 ENCOUNTER — Ambulatory Visit: Payer: Medicare Other | Admitting: Physician Assistant

## 2018-01-18 ENCOUNTER — Encounter: Payer: Self-pay | Admitting: Physician Assistant

## 2018-01-20 ENCOUNTER — Ambulatory Visit: Payer: Medicare Other

## 2018-02-05 ENCOUNTER — Telehealth: Payer: Self-pay | Admitting: *Deleted

## 2018-02-05 ENCOUNTER — Other Ambulatory Visit: Payer: Self-pay | Admitting: Physician Assistant

## 2018-02-05 DIAGNOSIS — F329 Major depressive disorder, single episode, unspecified: Secondary | ICD-10-CM

## 2018-02-05 DIAGNOSIS — M810 Age-related osteoporosis without current pathological fracture: Secondary | ICD-10-CM

## 2018-02-05 DIAGNOSIS — F32A Depression, unspecified: Secondary | ICD-10-CM

## 2018-02-05 NOTE — Telephone Encounter (Addendum)
Recast instructions   Annual Exam (1 year) ? 02/18/18 [place]  Called pt to verify insurance coverage ?YES informed pt Reclast is in Process ? YES  Labs must be in 30 days window Serum Creatinine  0.60  DATE? 04/14/18  Serum Calcium 9.5 DATE? 04/14/18   PA needed? NO (refrence # [id number])  Cost for Pt? $130  Order form filled out and faxed  w/MD sig?  Infusion will be done at ? [place]  Pt aware? YES  Instructions mailed to pt? YES   Afternoons will be best for pt I called short stay no answer  Reclast given 05/20/18  Next infusion 05/22/2019

## 2018-02-06 MED ORDER — ALPRAZOLAM 0.5 MG PO TABS: 0.5000 mg | ORAL_TABLET | Freq: Every evening | ORAL | 0 refills | Status: DC | PRN

## 2018-02-09 NOTE — Telephone Encounter (Addendum)
Spoke with pt she will call back to schedule appt Appt scheduled

## 2018-02-18 ENCOUNTER — Ambulatory Visit: Payer: Medicare Other | Admitting: Women's Health

## 2018-02-18 ENCOUNTER — Encounter: Payer: Self-pay | Admitting: Women's Health

## 2018-02-18 VITALS — BP 110/80 | Ht 66.0 in | Wt 170.0 lb

## 2018-02-18 DIAGNOSIS — Z01419 Encounter for gynecological examination (general) (routine) without abnormal findings: Secondary | ICD-10-CM | POA: Diagnosis not present

## 2018-02-18 DIAGNOSIS — M816 Localized osteoporosis [Lequesne]: Secondary | ICD-10-CM

## 2018-02-18 DIAGNOSIS — M810 Age-related osteoporosis without current pathological fracture: Secondary | ICD-10-CM | POA: Diagnosis not present

## 2018-02-18 LAB — CALCIUM: CALCIUM: 9.4 mg/dL (ref 8.6–10.4)

## 2018-02-18 LAB — CREATININE, SERUM: Creat: 0.96 mg/dL — ABNORMAL HIGH (ref 0.60–0.93)

## 2018-02-18 NOTE — Patient Instructions (Addendum)
Health Maintenance for Postmenopausal Women Menopause is a normal process in which your reproductive ability comes to an end. This process happens gradually over a span of months to years, usually between the ages of 19 and 59. Menopause is complete when you have missed 12 consecutive menstrual periods. It is important to talk with your health care provider about some of the most common conditions that affect postmenopausal women, such as heart disease, cancer, and bone loss (osteoporosis). Adopting a healthy lifestyle and getting preventive care can help to promote your health and wellness. Those actions can also lower your chances of developing some of these common conditions. What should I know about menopause? During menopause, you may experience a number of symptoms, such as:  Moderate-to-severe hot flashes.  Night sweats.  Decrease in sex drive.  Mood swings.  Headaches.  Tiredness.  Irritability.  Memory problems.  Insomnia.  Choosing to treat or not to treat menopausal changes is an individual decision that you make with your health care provider. What should I know about hormone replacement therapy and supplements? Hormone therapy products are effective for treating symptoms that are associated with menopause, such as hot flashes and night sweats. Hormone replacement carries certain risks, especially as you become older. If you are thinking about using estrogen or estrogen with progestin treatments, discuss the benefits and risks with your health care provider. What should I know about heart disease and stroke? Heart disease, heart attack, and stroke become more likely as you age. This may be due, in part, to the hormonal changes that your body experiences during menopause. These can affect how your body processes dietary fats, triglycerides, and cholesterol. Heart attack and stroke are both medical emergencies. There are many things that you can do to help prevent heart disease  and stroke:  Have your blood pressure checked at least every 1-2 years. High blood pressure causes heart disease and increases the risk of stroke.  If you are 71-55 years old, ask your health care provider if you should take aspirin to prevent a heart attack or a stroke.  Do not use any tobacco products, including cigarettes, chewing tobacco, or electronic cigarettes. If you need help quitting, ask your health care provider.  It is important to eat a healthy diet and maintain a healthy weight. ? Be sure to include plenty of vegetables, fruits, low-fat dairy products, and lean protein. ? Avoid eating foods that are high in solid fats, added sugars, or salt (sodium).  Get regular exercise. This is one of the most important things that you can do for your health. ? Try to exercise for at least 150 minutes each week. The type of exercise that you do should increase your heart rate and make you sweat. This is known as moderate-intensity exercise. ? Try to do strengthening exercises at least twice each week. Do these in addition to the moderate-intensity exercise.  Know your numbers.Ask your health care provider to check your cholesterol and your blood glucose. Continue to have your blood tested as directed by your health care provider.  What should I know about cancer screening? There are several types of cancer. Take the following steps to reduce your risk and to catch any cancer development as early as possible. Breast Cancer  Practice breast self-awareness. ? This means understanding how your breasts normally appear and feel. ? It also means doing regular breast self-exams. Let your health care provider know about any changes, no matter how small.  If you are 40  or older, have a clinician do a breast exam (clinical breast exam or CBE) every year. Depending on your age, family history, and medical history, it may be recommended that you also have a yearly breast X-ray (mammogram).  If you  have a family history of breast cancer, talk with your health care provider about genetic screening.  If you are at high risk for breast cancer, talk with your health care provider about having an MRI and a mammogram every year.  Breast cancer (BRCA) gene test is recommended for women who have family members with BRCA-related cancers. Results of the assessment will determine the need for genetic counseling and BRCA1 and for BRCA2 testing. BRCA-related cancers include these types: ? Breast. This occurs in males or females. ? Ovarian. ? Tubal. This may also be called fallopian tube cancer. ? Cancer of the abdominal or pelvic lining (peritoneal cancer). ? Prostate. ? Pancreatic.  Cervical, Uterine, and Ovarian Cancer Your health care provider may recommend that you be screened regularly for cancer of the pelvic organs. These include your ovaries, uterus, and vagina. This screening involves a pelvic exam, which includes checking for microscopic changes to the surface of your cervix (Pap test).  For women ages 21-65, health care providers may recommend a pelvic exam and a Pap test every three years. For women ages 79-65, they may recommend the Pap test and pelvic exam, combined with testing for human papilloma virus (HPV), every five years. Some types of HPV increase your risk of cervical cancer. Testing for HPV may also be done on women of any age who have unclear Pap test results.  Other health care providers may not recommend any screening for nonpregnant women who are considered low risk for pelvic cancer and have no symptoms. Ask your health care provider if a screening pelvic exam is right for you.  If you have had past treatment for cervical cancer or a condition that could lead to cancer, you need Pap tests and screening for cancer for at least 20 years after your treatment. If Pap tests have been discontinued for you, your risk factors (such as having a new sexual partner) need to be  reassessed to determine if you should start having screenings again. Some women have medical problems that increase the chance of getting cervical cancer. In these cases, your health care provider may recommend that you have screening and Pap tests more often.  If you have a family history of uterine cancer or ovarian cancer, talk with your health care provider about genetic screening.  If you have vaginal bleeding after reaching menopause, tell your health care provider.  There are currently no reliable tests available to screen for ovarian cancer.  Lung Cancer Lung cancer screening is recommended for adults 69-62 years old who are at high risk for lung cancer because of a history of smoking. A yearly low-dose CT scan of the lungs is recommended if you:  Currently smoke.  Have a history of at least 30 pack-years of smoking and you currently smoke or have quit within the past 15 years. A pack-year is smoking an average of one pack of cigarettes per day for one year.  Yearly screening should:  Continue until it has been 15 years since you quit.  Stop if you develop a health problem that would prevent you from having lung cancer treatment.  Colorectal Cancer  This type of cancer can be detected and can often be prevented.  Routine colorectal cancer screening usually begins at  age 42 and continues through age 45.  If you have risk factors for colon cancer, your health care provider may recommend that you be screened at an earlier age.  If you have a family history of colorectal cancer, talk with your health care provider about genetic screening.  Your health care provider may also recommend using home test kits to check for hidden blood in your stool.  A small camera at the end of a tube can be used to examine your colon directly (sigmoidoscopy or colonoscopy). This is done to check for the earliest forms of colorectal cancer.  Direct examination of the colon should be repeated every  5-10 years until age 71. However, if early forms of precancerous polyps or small growths are found or if you have a family history or genetic risk for colorectal cancer, you may need to be screened more often.  Skin Cancer  Check your skin from head to toe regularly.  Monitor any moles. Be sure to tell your health care provider: ? About any new moles or changes in moles, especially if there is a change in a mole's shape or color. ? If you have a mole that is larger than the size of a pencil eraser.  If any of your family members has a history of skin cancer, especially at a  age, talk with your health care provider about genetic screening.  Always use sunscreen. Apply sunscreen liberally and repeatedly throughout the day.  Whenever you are outside, protect yourself by wearing long sleeves, pants, a wide-brimmed hat, and sunglasses.  What should I know about osteoporosis? Osteoporosis is a condition in which bone destruction happens more quickly than new bone creation. After menopause, you may be at an increased risk for osteoporosis. To help prevent osteoporosis or the bone fractures that can happen because of osteoporosis, the following is recommended:  If you are 46-71 years old, get at least 1,000 mg of calcium and at least 600 mg of vitamin D per day.  If you are older than age 55 but er than age 65, get at least 1,200 mg of calcium and at least 600 mg of vitamin D per day.  If you are older than age 54, get at least 1,200 mg of calcium and at least 800 mg of vitamin D per day.  Smoking and excessive alcohol intake increase the risk of osteoporosis. Eat foods that are rich in calcium and vitamin D, and do weight-bearing exercises several times each week as directed by your health care provider. What should I know about how menopause affects my mental health? Depression may occur at any age, but it is more common as you become older. Common symptoms of depression  include:  Low or sad mood.  Changes in sleep patterns.  Changes in appetite or eating patterns.  Feeling an overall lack of motivation or enjoyment of activities that you previously enjoyed.  Frequent crying spells.  Talk with your health care provider if you think that you are experiencing depression. What should I know about immunizations? It is important that you get and maintain your immunizations. These include:  Tetanus, diphtheria, and pertussis (Tdap) booster vaccine.  Influenza every year before the flu season begins.  Pneumonia vaccine.  Shingles vaccine.  Your health care provider may also recommend other immunizations. This information is not intended to replace advice given to you by your health care provider. Make sure you discuss any questions you have with your health care provider. Document Released: 11/21/2005  Document Revised: 04/18/2016 Document Reviewed: 07/03/2015 Elsevier Interactive Patient Education  [employer].  [phone]  mammogram dexa

## 2018-02-18 NOTE — Progress Notes (Signed)
[name] [name] [birth year] [id number]    History:    Presents for test and pelvic  exam.  1889 TAH on no HRT for fibroids.  Normal Pap and mammogram history, overdue for mammogram.  2002 nephrectomy for RCC found coincidentally with cholecystectomy.  Has had 1 dose of Reclast.  2016 T score -3.2 left femoral neck.  2018 benign small colon polyp 5-year follow-up.  Primary care manages hypertension, hypothyroidism, COPD and,.  Dr. Collene Mares manages colitis.  History of melanoma every 44-month checkup.  Past medical history, past surgical history, family history and social history were all reviewed and documented in the EPIC chart.  Daughter history of drug abuse doing well.  9 year old niece breast cancer bilateral mastectomy survivor.  ROS:  A ROS was performed and pertinent positives and negatives are included.  Exam:  Vitals:   02/18/18 1356  BP: 110/80  Weight: 170 lb (77.1 kg)   Body mass index is 27.65 kg/m.   General appearance:  Normal Thyroid:  Symmetrical, normal in size, without palpable masses or nodularity. Respiratory  Auscultation:  Clear without wheezing or rhonchi Cardiovascular  Auscultation:  Regular rate, without rubs, murmurs or gallops  Edema/varicosities:  Not grossly evident Abdominal  Soft,nontender, without masses, guarding or rebound.  Liver/spleen:  No organomegaly noted  Hernia:  None appreciated  Skin  Inspection:  Grossly normal   Breasts: Examined lying and sitting.     Right: Without masses, retractions, discharge or axillary adenopathy.     Left: Without masses, retractions, discharge or axillary adenopathy. Gentitourinary   Inguinal/mons:  Normal without inguinal adenopathy  External genitalia:  Normal  BUS/Urethra/Skene's glands:  Normal  Vagina:  Normal  Cervix: And uterus absent  Adnexa/parametria:     Rt: Without masses or tenderness.   Lt: Without masses or tenderness.  Anus and perineum: Normal  Digital rectal exam: Normal sphincter  tone without palpated masses or tenderness  Assessment/Plan:  72 y.o. MWF G1 P1 for breast and pelvic exam with no GYN complaints.  1989 TAH for fibroids no HRT Osteoporosis without fracture on Reclast Hypertension, hypercholesteremia, depression, COPD, hypothyroidism-primary care manages labs and meds Colitis-Dr. Collene Mares manages 2002 nephrectomy for RCC no further treatment Melanoma every 9-month dermatology follow-up  N: Serum calcium and creatinine.  Reclast.  Will get scheduled.  SBE's, overdue for annual mammogram instructed to schedule.  reviewed importance of continued regular exercise increasing balance type exercise encouraged.  Safety, fall prevention discussed.  Repeat DEXA will schedule.  Calcium rich foods, vitamin D 2000 daily encouraged.  Aware of importance of sunscreen.  Pap screening guidelines reviewed.  No Pap.     [place], 2:54 PM 02/18/2018

## 2018-02-19 NOTE — Telephone Encounter (Addendum)
Labs came back with abnormal Creatinine at 0.96  I will route to Dr.Fontaine for clarity. If its ok to proceed with Reclast?   Per Elon Alas NP  Please call and review creat is slightly elevated, always at the high end of normal, repeat in one month the creat. And if normal proceed with the reclast. Remind to hydrate well.  Called pt left message for her to return my call

## 2018-02-23 ENCOUNTER — Other Ambulatory Visit: Payer: Self-pay

## 2018-02-23 ENCOUNTER — Ambulatory Visit (INDEPENDENT_AMBULATORY_CARE_PROVIDER_SITE_OTHER): Payer: Medicare Other | Admitting: Physician Assistant

## 2018-02-23 ENCOUNTER — Encounter: Payer: Self-pay | Admitting: Physician Assistant

## 2018-02-23 VITALS — BP 112/66 | HR 84 | Temp 99.4°F | Resp 16 | Ht 66.0 in | Wt 170.8 lb

## 2018-02-23 DIAGNOSIS — F32A Depression, unspecified: Secondary | ICD-10-CM

## 2018-02-23 DIAGNOSIS — N951 Menopausal and female climacteric states: Secondary | ICD-10-CM

## 2018-02-23 DIAGNOSIS — I10 Essential (primary) hypertension: Secondary | ICD-10-CM

## 2018-02-23 DIAGNOSIS — F329 Major depressive disorder, single episode, unspecified: Secondary | ICD-10-CM

## 2018-02-23 DIAGNOSIS — F411 Generalized anxiety disorder: Secondary | ICD-10-CM | POA: Diagnosis not present

## 2018-02-23 DIAGNOSIS — J449 Chronic obstructive pulmonary disease, unspecified: Secondary | ICD-10-CM | POA: Diagnosis not present

## 2018-02-23 DIAGNOSIS — E782 Mixed hyperlipidemia: Secondary | ICD-10-CM

## 2018-02-23 MED ORDER — ALPRAZOLAM 0.5 MG PO TABS: 0.5000 mg | ORAL_TABLET | Freq: Every evening | ORAL | 0 refills | Status: DC | PRN

## 2018-02-23 MED ORDER — AMLODIPINE BESYLATE 5 MG PO TABS: 2.5000 mg | ORAL_TABLET | Freq: Every day | ORAL | 3 refills | Status: AC
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
## 2018-02-23 NOTE — Assessment & Plan Note (Signed)
Well controlled 

## 2018-02-23 NOTE — Patient Instructions (Addendum)
Try peppermint oil dabbed behind your ears to help with hot flashes.  Go ahead and call [place] to schedule your next visit with me there. [phone].   IF you received an x-ray today, you will receive an invoice from [facility]. Please contact [facility] at [phone] with questions or concerns regarding your invoice.   IF you received labwork today, you will receive an invoice from [facility]. Please contact [facility] at [phone] with questions or concerns regarding your invoice.   Our billing staff will not be able to assist you with questions regarding bills from these companies.  You will be contacted with the lab results as soon as they are available. The fastest way to get your results is to activate your My Chart account. Instructions are located on the last page of this paperwork. If you have not heard from [place] regarding the results in 2 weeks, please contact this office.

## 2018-02-23 NOTE — Assessment & Plan Note (Signed)
Continue current treatment, pending lab results.

## 2018-02-23 NOTE — Assessment & Plan Note (Signed)
Stable. Well controlled.

## 2018-02-23 NOTE — Assessment & Plan Note (Signed)
Well controlled. Repeat at next visit, fasting.

## 2018-03-10 DIAGNOSIS — H401131 Primary open-angle glaucoma, bilateral, mild stage: Secondary | ICD-10-CM | POA: Diagnosis not present

## 2018-03-22 NOTE — Telephone Encounter (Signed)
Pt coming in for repeat labs 03/23/18

## 2018-03-23 ENCOUNTER — Other Ambulatory Visit: Payer: Medicare Other

## 2018-03-23 DIAGNOSIS — M810 Age-related osteoporosis without current pathological fracture: Secondary | ICD-10-CM | POA: Diagnosis not present

## 2018-03-23 LAB — CALCIUM: CALCIUM: 9.1 mg/dL (ref 8.6–10.4)

## 2018-03-23 LAB — CREATININE, SERUM: CREATININE: 1.01 mg/dL — AB (ref 0.60–0.93)

## 2018-03-24 NOTE — Telephone Encounter (Signed)
Spoke with Elon Alas NP regarding pt. She states she has left several messages. No call back from pt. Per protocol I will send letter out and close chart

## 2018-04-07 DIAGNOSIS — C642 Malignant neoplasm of left kidney, except renal pelvis: Secondary | ICD-10-CM | POA: Diagnosis not present

## 2018-04-07 DIAGNOSIS — Z905 Acquired absence of kidney: Secondary | ICD-10-CM | POA: Diagnosis not present

## 2018-04-12 ENCOUNTER — Encounter: Payer: Medicare Other | Admitting: Women's Health

## 2018-04-12 ENCOUNTER — Ambulatory Visit
Admission: RE | Admit: 2018-04-12 | Discharge: 2018-04-12 | Disposition: A | Discharge status: Home / Self Care | Payer: Medicare Other | Source: Ambulatory Visit | Attending: Physician Assistant | Admitting: Physician Assistant

## 2018-04-12 DIAGNOSIS — Z139 Encounter for screening, unspecified: Secondary | ICD-10-CM

## 2018-04-12 DIAGNOSIS — Z1231 Encounter for screening mammogram for malignant neoplasm of breast: Secondary | ICD-10-CM | POA: Diagnosis not present

## 2018-04-14 ENCOUNTER — Other Ambulatory Visit: Payer: Self-pay | Admitting: Physician Assistant

## 2018-04-14 ENCOUNTER — Ambulatory Visit (INDEPENDENT_AMBULATORY_CARE_PROVIDER_SITE_OTHER): Payer: Medicare Other | Admitting: Physician Assistant

## 2018-04-14 ENCOUNTER — Other Ambulatory Visit: Payer: Self-pay

## 2018-04-14 ENCOUNTER — Encounter: Payer: Self-pay | Admitting: Physician Assistant

## 2018-04-14 ENCOUNTER — Ambulatory Visit (HOSPITAL_COMMUNITY)
Admission: RE | Admit: 2018-04-14 | Discharge: 2018-04-14 | Disposition: A | Discharge status: Home / Self Care | Payer: Medicare Other | Source: Ambulatory Visit | Attending: Physician Assistant | Admitting: Physician Assistant

## 2018-04-14 VITALS — BP 116/76 | HR 85 | Temp 98.9°F | Resp 16 | Ht 65.0 in | Wt 167.6 lb

## 2018-04-14 DIAGNOSIS — J329 Chronic sinusitis, unspecified: Secondary | ICD-10-CM | POA: Diagnosis not present

## 2018-04-14 DIAGNOSIS — R42 Dizziness and giddiness: Secondary | ICD-10-CM

## 2018-04-14 DIAGNOSIS — H53133 Sudden visual loss, bilateral: Secondary | ICD-10-CM | POA: Diagnosis present

## 2018-04-14 LAB — POCT URINALYSIS DIP (MANUAL ENTRY)
Bilirubin, UA: NEGATIVE
Blood, UA: NEGATIVE
Glucose, UA: NEGATIVE mg/dL
Ketones, POC UA: NEGATIVE mg/dL
Leukocytes, UA: NEGATIVE
Nitrite, UA: NEGATIVE
Protein Ur, POC: NEGATIVE mg/dL
Spec Grav, UA: 1.01 (ref 1.010–1.025)
Urobilinogen, UA: 0.2 U/dL
pH, UA: 7 (ref 5.0–8.0)

## 2018-04-14 LAB — GLUCOSE, POCT (MANUAL RESULT ENTRY): POC Glucose: 89 mg/dL (ref 70–99)

## 2018-04-14 MED ORDER — DOXYCYCLINE HYCLATE 100 MG PO TABS: 100.0000 mg | ORAL_TABLET | Freq: Two times a day (BID) | ORAL | 0 refills | Status: DC

## 2018-04-14 NOTE — Patient Instructions (Addendum)
PLEASE ARRIVE AT [facility] BRAIN W/O CONTRAST AT 7:30PM THIS EVENING FOR AN 8:00PM APPT. RIGHT NEXT TO [place] IN THE BUILDING WHERE [facility] IS LOCATED. [phone] or [phone].    IF you received an x-ray today, you will receive an invoice from [facility]. Please contact [facility] at [phone] with questions or concerns regarding your invoice.   IF you received labwork today, you will receive an invoice from [place]. Please contact [facility] at [phone] with questions or concerns regarding your invoice.   Our billing staff will not be able to assist you with questions regarding bills from these companies.  You will be contacted with the lab results as soon as they are available. The fastest way to get your results is to activate your My Chart account. Instructions are located on the last page of this paperwork. If you have not heard from [place] regarding the results in 2 weeks, please contact this office.

## 2018-04-14 NOTE — Progress Notes (Signed)
[name] [name]  MRN: [id number] DOB: 07-Mar-1946  PCP: Harrison Mons, PA-C  Subjective:  Pt is a pleasant 72 year old female who presents to clinic for dizziness and vision changes.  She is here today with her husband. Endorses dizziness all day yesterday. "felt like the room was spinning".  Dizziness is a little bit better today.  Today when she walks it's like "something in my head is like gushing, it's like water".  Last night she laid down to go to bed "and everything went black". Episodes lasted a few minutes.  "I could not see anything ".  She called for her husband and he sat with her until the vision returned.  She then went to bed and slept fine.  Dizziness was present this morning when she woke up.  Head movement makes it worse. Fast movements make it worse. "feels like there is a brink in my head".  She endorses HA posterior head the past few weeks. On and off.  She denies muscle weakness, one-sided weakness, difficulty with speech, nausea, vomiting, confusion.   Changed from Zyrtec to a different brand, but the equivalent.   FHx: Aunt with brain tumor  Pt  has a past medical history of Allergy, Anxiety, Asthma, BCC (basal cell carcinoma), leg, left (08/2017), Cancer ([place]), Cervical dysplasia, COPD (chronic obstructive pulmonary disease) ([place]), Depression, Elevated cholesterol, GERD (gastroesophageal reflux disease), Glaucoma, Hypertension, Lymphocytic colitis, Osteoporosis (08/2015), SCC (squamous cell carcinoma), leg, right (08/2017), and Serum calcium elevated.  Review of Systems  Constitutional: Negative for chills, diaphoresis, fatigue and fever.  Eyes: Positive for visual disturbance.  Cardiovascular: Negative for chest pain and palpitations.  Neurological: Positive for dizziness, light-headedness and headaches. Negative for speech difficulty and weakness.  Psychiatric/Behavioral: Negative for confusion and sleep disturbance. The patient is not nervous/anxious.      Patient Active Problem List   Diagnosis Date Noted  . GAD (generalized anxiety disorder) 06/18/2017  . Chronic left-sided low back pain with left-sided sciatica 06/18/2017  . Lymphocytic colitis 06/18/2017  . Single kidney 05/05/2017  . Depression 05/05/2017  . Eosinophilia 11/27/2016  . H/O sleep apnea 11/27/2016  . Normocalcemic Hyperparathyroidism ([place]) 11/21/2016  . Osteoporosis 11/21/2016  . Allergic rhinitis 03/17/2016  . Multiple nasal polyps 03/17/2016  . Chronic obstructive airway disease with asthma ([place]) 12/24/2015  . Vitamin D Deficiency 02/14/2014  . Other abnormal glucose 02/14/2014  . Encounter for long-term (current) use of other medications 12/13/2013  . Hypertension   . Hyperlipidemia   . Cervical dysplasia   . DUB (dysfunctional uterine bleeding)   . Hypothyroidism 11/30/2007    Current Outpatient Medications on File Prior to Visit  Medication Sig Dispense Refill  . albuterol (PROAIR HFA) 108 (90 Base) MCG/ACT inhaler Inhale 1-2 puffs into the lungs every 4 (four) hours as needed for wheezing or shortness of breath. 1 Inhaler 3  . amLODipine (NORVASC) 5 MG tablet Take 0.5-1 tablets (2.5-5 mg total) by mouth daily. 45 tablet 3  . brimonidine (ALPHAGAN) 0.2 % ophthalmic solution     . Cholecalciferol (HM VITAMIN D3) 4000 units CAPS Take 1 capsule by mouth daily.    Marland Kitchen co-enzyme Q-10 50 MG capsule Take 50 mg by mouth daily.    . fluticasone furoate-vilanterol (BREO ELLIPTA) 200-25 MCG/INH AEPB Inhale 1 puff into the lungs daily. 60 each 3  . levothyroxine (SYNTHROID, LEVOTHROID) 100 MCG tablet TAKE 1 TABLET EVERY DAY 90 tablet 1  . losartan (COZAAR) 50 MG tablet Take 50 mg by mouth  daily.  2  . NONFORMULARY OR COMPOUNDED ITEM cbd oil drops    . pantoprazole (PROTONIX) 40 MG tablet Take 40 mg by mouth daily.    . Probiotic Product (PROBIOTIC PO) Take 1 tablet by mouth at bedtime.     . RHOPRESSA 0.02 % SOLN     . rosuvastatin (CRESTOR) 10 MG tablet Take 10 mg  by mouth daily.  2  . triamcinolone cream (KENALOG) 0.1 % APPLY TO AFFECTED AREA UP TO TWICE DAILY AS NEEDED FOR ECZEMA (NOT TO FACE, GROIN, UNDERARMS)  3  . venlafaxine XR (EFFEXOR-XR) 150 MG 24 hr capsule Take 1 capsule (150 mg total) daily with breakfast by mouth. 90 capsule 3  . Zoledronic Acid (RECLAST IV) Inject into the vein.    Marland Kitchen ALPRAZolam (XANAX) 0.5 MG tablet Take 1-2 tablets (0.5-1 mg total) by mouth at bedtime as needed for sleep. (Patient not taking: Reported on 04/14/2018) 60 tablet 0  . ALPRAZolam (XANAX) 0.5 MG tablet Take 1-2 tablets (0.5-1 mg total) by mouth at bedtime as needed for sleep. (Patient not taking: Reported on 04/14/2018) 60 tablet 0  . ALPRAZolam (XANAX) 0.5 MG tablet Take 1-2 tablets (0.5-1 mg total) by mouth at bedtime as needed for sleep. (Patient not taking: Reported on 04/14/2018) 60 tablet 0  . cetirizine (ZYRTEC) 10 MG tablet Take 10 mg by mouth as needed for allergies.     No current facility-administered medications on file prior to visit.     Allergies  Allergen Reactions  . Iodine     Pt has only one kidney.  . Iohexol      Code: RASH, Desc: PT STATES SHE HAS ONE KIDNEY SO NOT TO USE IV DYE/10/27/06/RM, Onset Date: [id number]   . Penicillins   . Sulfa Antibiotics Nausea Only  . Neosporin [Neomycin-Polymyxin-Gramicidin] Rash     Objective:  BP 116/76 (BP Location: Right Arm, Patient Position: Sitting, Cuff Size: Normal)   Pulse 85   Temp 98.9 F (37.2 C) (Oral)   Resp 16   Ht _0  (1.651 m)   Wt 167 lb 9.6 oz (76 kg)   SpO2 96%   BMI 27.89 kg/m   Physical Exam  Constitutional: She is oriented to person, place, and time. No distress.  Eyes: Pupils are equal, round, and reactive to light. Conjunctivae and EOM are normal.  Cardiovascular: Normal rate, regular rhythm and normal heart sounds.  Neurological: She is alert and oriented to person, place, and time. She has normal strength. No cranial nerve deficit. She displays a negative Romberg sign.   Skin: Skin is warm and dry.  Psychiatric: Judgment normal.  Vitals reviewed.  Results for orders placed or performed in visit on 04/14/18  POCT urinalysis dipstick  Result Value Ref Range   Color, UA yellow yellow   Clarity, UA clear clear   Glucose, UA negative negative mg/dL   Bilirubin, UA negative negative   Ketones, POC UA negative negative mg/dL   Spec Grav, UA 1.010 1.010 - 1.025   Blood, UA negative negative   pH, UA 7.0 5.0 - 8.0   Protein Ur, POC negative negative mg/dL   Urobilinogen, UA 0.2 0.2 or 1.0 E.U./dL   Nitrite, UA Negative Negative   Leukocytes, UA Negative Negative  POCT glucose (manual entry)  Result Value Ref Range   POC Glucose 89 70 - 99 mg/dl    Assessment and Plan :  1. Sudden visual loss of both eyes -Patient presents complaining of an episode of  sudden onset of bilateral complete visual loss lasting several minutes last night.  Vision returned to normal.  No episodes since.  She is currently asymptomatic. no deficits on her neuro exam.  Cannot rule out TIA.  Plan to get MR of the brain tonight.  We will contact her with results and plan - MR BRAIN WO CONTRAST; Future  2. Dizziness 3. Lightheaded - POCT urinalysis dipstick - CMP14+EGFR - CBC with Differential/Platelet - POCT glucose (manual entry)   Mercer Pod, PA-C  [facility] 04/14/2018 2:42 PM

## 2018-04-15 LAB — CMP14+EGFR
ALT: 12 IU/L (ref 0–32)
AST: 17 IU/L (ref 0–40)
Albumin/Globulin Ratio: 1.7 (ref 1.2–2.2)
Albumin: 4.1 g/dL (ref 3.5–4.8)
Alkaline Phosphatase: 42 IU/L (ref 39–117)
BUN/Creatinine Ratio: 10 — ABNORMAL LOW (ref 12–28)
BUN: 6 mg/dL — ABNORMAL LOW (ref 8–27)
Bilirubin Total: <0.2 mg/dL (ref 0.0–1.2)
CO2: 24 mmol/L (ref 20–29)
Calcium: 9.5 mg/dL (ref 8.7–10.3)
Chloride: 101 mmol/L (ref 96–106)
Creatinine, Ser: 0.6 mg/dL (ref 0.57–1.00)
GFR calc Af Amer: 106 mL/min/{1.73_m2} (ref >59)
GFR calc non Af Amer: 92 mL/min/{1.73_m2} (ref >59)
Globulin, Total: 2.4 g/dL (ref 1.5–4.5)
Glucose: 111 mg/dL — ABNORMAL HIGH (ref 65–99)
Potassium: 3.9 mmol/L (ref 3.5–5.2)
Sodium: 139 mmol/L (ref 134–144)
Total Protein: 6.5 g/dL (ref 6.0–8.5)

## 2018-04-15 LAB — CBC WITH DIFFERENTIAL/PLATELET
Basophils Absolute: 0 10*3/uL (ref 0.0–0.2)
Basos: 0 %
EOS (ABSOLUTE): 0.5 10*3/uL — ABNORMAL HIGH (ref 0.0–0.4)
Eos: 6 %
Hematocrit: 42.6 % (ref 34.0–46.6)
Hemoglobin: 13.9 g/dL (ref 11.1–15.9)
Immature Grans (Abs): 0 10*3/uL (ref 0.0–0.1)
Immature Granulocytes: 0 %
Lymphocytes Absolute: 2.4 10*3/uL (ref 0.7–3.1)
Lymphs: 34 %
MCH: 30.2 pg (ref 26.6–33.0)
MCHC: 32.6 g/dL (ref 31.5–35.7)
MCV: 92 fL (ref 79–97)
Monocytes Absolute: 0.5 10*3/uL (ref 0.1–0.9)
Monocytes: 7 %
Neutrophils Absolute: 3.7 10*3/uL (ref 1.4–7.0)
Neutrophils: 53 %
Platelets: 222 10*3/uL (ref 150–450)
RBC: 4.61 x10E6/uL (ref 3.77–5.28)
RDW: 13.6 % (ref 12.3–15.4)
WBC: 7.1 10*3/uL (ref 3.4–10.8)

## 2018-04-22 ENCOUNTER — Encounter: Payer: Self-pay | Admitting: Physician Assistant

## 2018-05-04 ENCOUNTER — Encounter: Payer: Self-pay | Admitting: Gynecology

## 2018-05-04 ENCOUNTER — Ambulatory Visit (INDEPENDENT_AMBULATORY_CARE_PROVIDER_SITE_OTHER): Payer: Medicare Other

## 2018-05-04 DIAGNOSIS — M810 Age-related osteoporosis without current pathological fracture: Secondary | ICD-10-CM

## 2018-05-04 DIAGNOSIS — M816 Localized osteoporosis [Lequesne]: Secondary | ICD-10-CM

## 2018-05-05 ENCOUNTER — Telehealth: Payer: Self-pay | Admitting: *Deleted

## 2018-05-05 NOTE — Telephone Encounter (Signed)
Spoke with patient regarding Reclast. In our conversation she mentioned referral to Endocrinologist.  Pt states she pefers to see to see Dr. Delrae [name]  If we could send a referral to him instead of Dr Cruzita Lederer.  Will route to [employer] for referal

## 2018-05-05 NOTE — Telephone Encounter (Signed)
My understanding is the patient has been on Reclast each year since 2016.  If so then yes to continue on Reclast.

## 2018-05-05 NOTE — Telephone Encounter (Signed)
Ok will call pt and [facility]

## 2018-05-05 NOTE — Telephone Encounter (Signed)
Pt had a recent DEXA per Dr. Phineas Real Note   Manus Gunning,   Your most recent bone density shows improvement from your prior study. Which is a favorable result on your medication. I would continue on your medication for now and repeat your bone density in 2 years.       To clarify Proceed/continue with Reclast? Creatinine  0.60 normal range Calcium 9.5 normal range  Will route to Dr. Phineas Real

## 2018-05-05 NOTE — Telephone Encounter (Signed)
Office notes faxed to [place] office [phone] they will call to schedule.

## 2018-05-13 ENCOUNTER — Other Ambulatory Visit (HOSPITAL_COMMUNITY): Payer: Self-pay | Admitting: *Deleted

## 2018-05-14 ENCOUNTER — Ambulatory Visit (HOSPITAL_COMMUNITY)
Admission: RE | Admit: 2018-05-14 | Discharge: 2018-05-14 | Disposition: A | Discharge status: Home / Self Care | Payer: Medicare Other | Source: Ambulatory Visit | Attending: Gynecology | Admitting: Gynecology

## 2018-05-14 DIAGNOSIS — M810 Age-related osteoporosis without current pathological fracture: Secondary | ICD-10-CM | POA: Insufficient documentation

## 2018-05-14 MED ORDER — ZOLEDRONIC ACID 5 MG/100ML IV SOLN
INTRAVENOUS | Status: AC
  Administered 2018-05-14: 5 mg
  Filled 2018-05-14: qty 100

## 2018-05-14 MED ORDER — ZOLEDRONIC ACID 5 MG/100ML IV SOLN: 5.0000 mg | Freq: Once | INTRAVENOUS | Status: DC

## 2018-05-20 NOTE — Telephone Encounter (Signed)
Reclast Given 05/14/18 Next infusion 05/16/2019

## 2018-05-20 NOTE — Telephone Encounter (Signed)
Reclast given 05/20/18  Next infusion 05/22/2019

## 2018-05-27 NOTE — Telephone Encounter (Signed)
Patient scheduled on 08/31/18

## 2018-09-15 ENCOUNTER — Ambulatory Visit: Payer: Medicare Other | Admitting: Pulmonary Disease

## 2018-09-15 ENCOUNTER — Encounter: Payer: Self-pay | Admitting: Pulmonary Disease

## 2018-09-15 VITALS — BP 112/66 | HR 91 | Temp 98.0°F | Ht 65.0 in | Wt 163.6 lb

## 2018-09-15 DIAGNOSIS — J449 Chronic obstructive pulmonary disease, unspecified: Secondary | ICD-10-CM

## 2018-09-15 DIAGNOSIS — Z8669 Personal history of other diseases of the nervous system and sense organs: Secondary | ICD-10-CM

## 2018-09-15 DIAGNOSIS — D721 Eosinophilia, unspecified: Secondary | ICD-10-CM

## 2018-09-15 DIAGNOSIS — J339 Nasal polyp, unspecified: Secondary | ICD-10-CM | POA: Diagnosis not present

## 2018-09-15 DIAGNOSIS — J309 Allergic rhinitis, unspecified: Secondary | ICD-10-CM | POA: Diagnosis not present

## 2018-09-15 MED ORDER — FLUTICASONE FUROATE-VILANTEROL 200-25 MCG/INH IN AEPB: 1.0000 | INHALATION_SPRAY | Freq: Every day | RESPIRATORY_TRACT | 3 refills | Status: AC

## 2018-09-15 MED ORDER — FLUTICASONE FUROATE-VILANTEROL 200-25 MCG/INH IN AEPB: 1.0000 | INHALATION_SPRAY | Freq: Every day | RESPIRATORY_TRACT | 0 refills | Status: DC

## 2018-09-15 NOTE — Patient Instructions (Signed)
Today we updated your med list in our EPIC system...    Continue your current medications the same...    We refilled the meds you requested...  Continue the BREO daily...  OK to use OTC antihistamine like Claritin/ Zyrtek/ or Allegra daily as needed...    AND the OTC generic FLONASE- 2 sprays in each nostril at bedtime...  Remember to use an OTC nasal saline mist every 1-2 hours in nostrils as needed for congestion...  Please feel free to call [facility] if your asthma flair, or if you have any breathing issues in the future...    It has been my honor to have served you over the past few years...    Wishing you good health & much happiness in the years to come!!!

## 2018-09-15 NOTE — Progress Notes (Signed)
Subjective:     Patient ID: [name] [name], female   DOB: May 09, 1946, 72 y.o.   MRN: [id number]  HPI     ~  December 24, 2015:  Initial pulmonary consult by SN>  12 y/o WF, ex-smoker w/ a hx of asthma & GOLD Stage 2 COPD, referred by DrTeoh for a pre-op respiratory evaluation...     Her PCP is Dr. Sandi Mariscal at [facility])-- followed for AR/ Asthma/ COPD, HBP, HL, Hypothyroid, GERD, Hx kidney cancer- s/p left nephrectomy, Anxiety/Depression...    72 y/o WF referred by DrTeoh, ENT for pre-op pulmonary clearance for planned ENT surg (see below)>     [name] [name] has a min smoking hx & quit in 1984; she was prev treated by DrMcKeown on Symbicort160-2spBid, then moved to [place], then back to [place] and her new PCP DrSun gave her Memory Dance & she really likes this med but they are all >$300 on her insurance; she notes min cough, small amt clear sput, DOE w/ exertion but does ok w/ usual activities and ADLs; she had a bronchitic exac 10/2015 w/ some discolored sput & streaky hemoptysis & was seen in the ER-- Symbicort & Ventolin were listed as her meds, Chest was clear on exam, CXR was clear/NAD and LABS were also wnl including D-dimer; Given NEB rx and antibiotic & improved; she noted some SOB but mostly because she couldn't breathe thru her nose=> referred to DrTeoh, ENT...    She saw DrCDYoung yrs ago- last 2009 & avail records indicate hx Asthma w/ COPD & mult exac treated w/ Pred etc; she had DOE & faint wheezing on exam; Hx GERD treated by DrPerry    She saw DrTeoh w/ facial pressure & nasal obstruction- eval w/ CT Sinus & flex endoscopy revealed bilat nasal polyps, deviated septum, bilat inferior turbinate hypertrophy, & a large right concha bullosa;  He plans surg for all these findings and requests pre-op pulmonary eval...     Note: there is a mention of "mild to mod OSA" diagnosed on a sleep study in 2015 & a letter from [facility] for an oral sleep appliance; none of the original data is avail to [place] &  the pt never received any treatment for OSA...  Smoking Hx>  Ex-smoker, smoked for ~10 yrs, up to 1/2 PPD, quit smoking in 1984 for ~5pack-yr smoking hx...  Pulmonary Hx>  She sees DrSharma for Allergy (we do not have his records); told to have asthma and COPD in the past; said to have OSA as well (we do not have any of this data).  Medical Hx>  HBP, HL, Hypothyroid, GERD, Hx kidney cancer- s/p left nephrectomy, Anxiety/Depression  Family Hx>  Father had lung cancer, Mother had asthma; her [place] were [place].  Occup Hx>  No known exposure to asbestos or any toxins.  Current Meds>  Xyzal5, VentolinHFA, Breo, [place], Apres25, Protonix40, Synthroid100, Prolia, Alpraz0.5, Lexapro20  EXAM shows Afeb, VSS, O2sat=96% onRA;  HEENT- nasal obstruction/ congestion, mallampati3;  Chest- few bibasilar rhonchi w/o w/r/consolidation;  Heart- RR w/o m/r/g;  Abd- soft, nontender, neg;  Ext- w/o c/c/e;  Neuro- intact...  Last CXR in Epic was 11/08/15>  Norm heart size, clear sl hyperexpanded lungs, mild scoliosis, NAD...  Spirometry 12/24/15>  FVC=2.40 (79%), FEV1=1.25 (54%), %1sec=52, mid-flows reduced at 32% predicted... c/w moderate airflow obstruction and GOLD Stage 2 COPD...  Ambulatory oximetry 12/24/15>  O2sat=100% on RA at rest w/ pulse=69/min;  She ambulated 3 Laps  in the office w/ lowest O2say=98% w/ pulse=97/min... No desaturations...  IMP >>     1) COPD-- mod persistent, chronic bronchitic type, with a reversible component     2) Hx allergic rhinitis-- treated by DrSharma, we do not have his notes    3) Hx OSA-- sleep study and rx per DrMcKeown in 2015, we do not have any of this data    4) ENT-- per DrTeoh w/ facial pressure & nasal obstruction- eval w/ CT Sinus & flex endoscopy revealed bilat nasal polyps, deviated septum, bilat inferior turbinate hypertrophy, & a large right concha bullosa    5) Medical issues-- per DrSun w/ HBP, HL, Hypothyroid, GERD, Hx kidney cancer-  s/p left nephrectomy, osteopenia, Anxiety/Depression PLAN >>     [name] [name] has done well on the Breo one inhalation daily but the $300/mo is prohibitive; we discussed trial of NEBULIZED medications to see if they work as well and are less expensive; In this regard- start DUONEB BID via Nebulizer and BUDESONIDE 0.25 BID via Nebulizer; she will continue to use the VentolinHFA as needed and her other meds as listed... we plan an ROV recheck in 23mo sooner if needed for acute problems, she is OK for surg...  ~  March 17, 2016:  391moOV w/ SN>  FrManus Gunningeports that she had her sinus surg by DrTeoh (polyps removed, inferior nasal turbinates reduced, and dev septum repaired) and she is able to breathe thru her nose much better & as a result she feels that her breathing overall is much improved;  When she was here 3/13- we tried to improve her meds/ costs by changing her Breo/ Incruse to NEBS w/ Duoneb Bid & Budes Bid, but she notes that this wasn't holding her as well (prob due to the shorter acting B-agonist & anticholinergic in Duoneb);  I have rec to pt that she can increase the Duoneb med to Qid if needed, and continue the Budesonide Bid- she prefers this to Breo/ Incruse to to the cost of the latter inhalers... We reviewed the following medical problems during today's office visit >>     COPD/ AB-- mod persistent, chronic bronchitic type, with a reversible component; FEV1 12/2015= 1.25 (54%); she quit smoking 1984 & has only a 5 pack-yr smoking hx; due to cost issues we have tried to optimize her therapy w/ min expense & rec NEBS w/ DUONEB Qid and BUDESONIDE 0.25Bid; she has a PrProgramme researcher, broadcasting/film/videonhaler for prn use when out & about...    Hx allergic rhinitis-- treated by DrSharma, we do not have his notes.    Hx OSA-- sleep study and rx per DrMcKeown in 2015, we do not have any of this data.    ENT-- per DrTeoh w/ facial pressure & nasal obstruction- eval w/ CT Sinus & flex endoscopy revealed bilat nasal polyps, deviated  septum, bilat inferior turbinate hypertrophy, & a large right concha bullosa => s/p endoscopic sinus surg 01/2016 and she reports much improved & able to breathe thru her nose...     Medical issues-- per DrSun w/ HBP, HL, Hypothyroid, GERD, Hx kidney cancer- s/p left nephrectomy, osteopenia, Anxiety/Depression EXAM shows Afeb, VSS, O2sat=97% onRA;  HEENT- imroved nasal airway s/p surg, mallampati3;  Chest- clear w/o w/r/r;  Heart- RR w/o m/r/g;  Abd- soft, nontender, neg;  Ext- w/o c/c/e;  Neuro- intact... IMP/PLAN>>  Problems as noted, she is improved overall but the Bid NEB sched is not working long enough- she still prefers the NEBs over the  inhalers due to cost; Rec to incr Duoneb to Qid, continue Budesonide Bid;  SHE NEEDS A NEW SMALLER PORTABLE NEB UNIT=> we will request thru her DME company;  She will try to get [place] sleep apnea data (sleep study date & results, machine info & download data;  We plan ROV in 81mo sooner if needed prn...  ~  September 16, 2016:  660moOV & [name] [name] reports that she has had mult flair-ups in her AB, frequently requiring antibiotics and Pred from her PCP, DrSun;  She tells me that she still feels that the NEBULIZER meds (Duoneb + Budes) makes her worse (ie- breathing worse but she can't elaborate for me what she means) and that she is better when she backs off the neb rx;  She notes that she likes the BREO + INCRUSE better & says she does fine on these meds but they are too $$ in the donut hole;  Her CC today is recurrent nasal congestion & thick green nasal drainage- this has drained to her throat as well producing increased cough & mild wheezing... We reviewed the following medical problems during today's office visit >>     Asthma/ AB/ & COPD based on min smoking hx & what sounds like long-term poor airway control> mod persistent, chronic bronchitic type, with a reversible component; FEV1 12/2015= 1.25 (54%); she quit smoking 1984 & has only a 5 pack-yr smoking hx; due to cost  issues we have tried to optimize her therapy w/ min expense & tried NEBS w/ DUONEB Qid and BUDESONIDE 0.25Bid but she claims the nebulizer makes her breathing worse; she feels that she does well on BREO + INCRUSE but cost is an issue in the donut hole, where she uses her rescue inhale too often; +FamHx- mother has Asthma on Xolair, Father died from lung cancer... We discussed further eval w/ IgE/ RAST/ CBC w/ diff to check eos & she if she is a candidate for a biological...    Hx allergic rhinitis> eval by DrSharma, we do not have his notes, but pt reports that allergy testing was NEG, despite hx nasal polyposis etc...    Hx OSA> sleep study and rx per DrMcKeown in 2015, we do not have any of this data avail to review...    ENT> per DrTeoh w/ facial pressure & nasal obstruction- eval w/ CT Sinus & flex endoscopy revealed bilat nasal polyps, deviated septum, bilat inferior turbinate hypertrophy, & a large right concha bullosa => s/p endoscopic sinus surg 01/2016 and she reports much improved & able to breathe thru her nose for awhile until her congestion returned; Rec to resume her nasal irrigations.....Marland KitchenMarland KitchenMarland Kitchen   Medical issues> per DrSun w/ HBP, HL, Hypothyroid, GERD, Hx kidney cancer- s/p left nephrectomy, prob secondary hyperpara, osteoporosis (DrFernandez), Anxiety/Depression... EXAM shows Afeb, VSS, O2sat=95% onRA;  HEENT- recurrent nasal congestion, mallampati3;  Chest- mild end exp wheezing L>R w/o rales/ rhonchi/ consolidation;  Heart- RR w/o m/r/g;  Abd- soft, nontender, neg;  Ext- w/o c/c/e...   LABS 09/16/16>  IgE=83 (nl<115);  RAST= all NEG;  CBCw/diff shows Hg=14.8, WBC=9.1 w/ 17% eos... IMP/PLAN>>  We discussed her dilemma and decided on RX w/ BREO- one inhalation daily, INCRUSE- one inhalation daily, ProairHFA rescue inhaler vs NEB w/ Duoneb as needed;  She will also start Rx w/ her nasal saline irrigations (per DrTeoh), and MUCINEX 60058mid w/ fluids;  For her acute prob we will Rx w/ Depo80 +  MEDROL Dosepak (she doesn't like Pred);  Finally we will pursue further eval w/ Ige/RAST testing & check a CBC w/ diff (off Pred x several months now) to check EOS & consider a biological if indicated... we plan ROV recheck in 6-8wks.  NOTE> IgE/RAST are NEG but CBC w/ 17% eos, she would be a candidate for a biological eg- Mamie Levers & we will discuss on ret in Jan...  ~  November 26, 2016:  37moROV & [name] [name] reports a good response to the MEDROL, Mucinex, nasal irrigations + her regular BREO, INCRUSE, ProairHFA;  When last seen her additional Asthma work-up showed IgE=83 (nl<115);  RAST= all NEG and CBC w/diff shows Hg=14.8, WBC=9.1 w/ 17% eos;  We discussed the poss of a biologic for her hypereosinophilia (Geradine Girt [place], FBox Elder but she feels so good now/ verry satisfied w/ her breathing/ doesn't want additional therapy at this juncture...  OK 2017 flu vaccine today, and she needs Prevnar-13 & another Pneumovax-23 but wants to check w/ insurance first... SEE PROB LIST ABOVE... EXAM shows Afeb, VSS, O2sat=97% onRA;  HEENT- decreased nasal congestion, mallampati3;  Chest- neg, clear w/o wheezing/ rales/ rhonchi/ consolidation;  Heart- RR w/o m/r/g;  Abd- soft, nontender, neg;  Ext- w/o c/c/e...   LABS 09/16/16>  IgE=83 (nl<115);  RAST= all NEG;  CBCw/diff shows Hg=14.8, WBC=9.1 w/ 17% eos... IMP/PLAN>>  FManus Gunningis much improved after Medrol given 02/2016; she has maintained Breo/ Incruse &remains stable at this time therefore does NOT want to consider a biologic for her asthma (+candidate for Nucala etc);  rec to continue same meds, ok flu vaccine today...    ~  September 15, 2018:  237moOV & pulmonary follow up visit>  FrManus Gunningeturns after a 2y36yratus to have her Breo refilled- she did not know that she could get this from her PCP- CJeffery,PA & [facility]ouska at [facility]       Past Medical History:  Diagnosis Date  . Allergy   . Anxiety   . Asthma   . BCC (basal cell  carcinoma), leg, left 08/2017  . Cancer ([facility]  Kidney cancer-Hypernephroma  . Cervical dysplasia   . COPD (chronic obstructive pulmonary disease) ([facility] . Depression   . Elevated cholesterol   . GERD (gastroesophageal reflux disease)   . Glaucoma   . Hypertension   . Lymphocytic colitis   . Osteoporosis 04/2018   2016 T score -3.2 2019 T score -2.8  . SCC (squamous cell carcinoma), leg, right 08/2017  . Serum calcium elevated     Past Surgical History:  Procedure Laterality Date  . ABDOMINAL HYSTERECTOMY  1989   TAH LSO  . AUGMENTATION MAMMAPLASTY     Implants removed  . COLPOSCOPY    . COSMETIC SURGERY    . EYE SURGERY  2017  . KIDNEY REMOVED  2002   Left  . NOSE SURGERY  2017  . OOPHORECTOMY     LSO  . TONSILLECTOMY      Outpatient Encounter Medications as of 09/15/2018  Medication Sig  . albuterol (PROAIR HFA) 108 (90 Base) MCG/ACT inhaler Inhale 1-2 puffs into the lungs every 4 (four) hours as needed for wheezing or shortness of breath.  . ALPRAZolam (XANAX) 0.5 MG tablet Take 1-2 tablets (0.5-1 mg total) by mouth at bedtime as needed for sleep.  . aMarland KitchenLODipine (NORVASC) 5 MG tablet Take 0.5-1 tablets (2.5-5 mg total) by mouth daily.  . brimonidine (ALPHAGAN) 0.2 % ophthalmic solution   .  cetirizine (ZYRTEC) 10 MG tablet Take 10 mg by mouth as needed for allergies.  . Cholecalciferol (HM VITAMIN D3) 4000 units CAPS Take 1 capsule by mouth daily.  Marland Kitchen co-enzyme Q-10 50 MG capsule Take 50 mg by mouth daily.  . fluticasone furoate-vilanterol (BREO ELLIPTA) 200-25 MCG/INH AEPB Inhale 1 puff into the lungs daily.  Marland Kitchen levothyroxine (SYNTHROID, LEVOTHROID) 100 MCG tablet TAKE 1 TABLET EVERY DAY  . losartan (COZAAR) 50 MG tablet Take 50 mg by mouth daily.  . NONFORMULARY OR COMPOUNDED ITEM cbd oil drops  . pantoprazole (PROTONIX) 40 MG tablet Take 40 mg by mouth daily.  . Probiotic Product (PROBIOTIC PO) Take 1 tablet by mouth at bedtime.   . RHOPRESSA 0.02 % SOLN   .  rosuvastatin (CRESTOR) 10 MG tablet Take 10 mg by mouth daily.  Marland Kitchen triamcinolone cream (KENALOG) 0.1 % APPLY TO AFFECTED AREA UP TO TWICE DAILY AS NEEDED FOR ECZEMA (NOT TO FACE, GROIN, UNDERARMS)  . venlafaxine XR (EFFEXOR-XR) 150 MG 24 hr capsule Take 1 capsule (150 mg total) daily with breakfast by mouth.  . Zoledronic Acid (RECLAST IV) Inject into the vein.  . fluticasone furoate-vilanterol (BREO ELLIPTA) 200-25 MCG/INH AEPB Inhale 1 puff into the lungs daily.   No facility-administered encounter medications on file as of 09/15/2018.     Allergies  Allergen Reactions  . Iodine     Pt has only one kidney.  . Iohexol      Code: RASH, Desc: PT STATES SHE HAS ONE KIDNEY SO NOT TO USE IV DYE/10/27/06/RM, Onset Date: [id number]   . Penicillins     Has patient had a PCN reaction causing immediate rash, facial/tongue/throat swelling, SOB or lightheadedness with hypotension: (No) Has patient had a PCN reaction causing severe rash involving mucus membranes or skin necrosis: (No) Has patient had a PCN reaction that required hospitalization (No) Has patient had a PCN reaction occurring within the last 10 years: (No) Pt states she has historically tolerated PCN, but tested positive for allergy.   If all of the above answers are "NO", then may proce  . Sulfa Antibiotics Nausea Only    Immunization History  Administered Date(s) Administered  . Influenza Split 07/18/2015  . Influenza, High Dose Seasonal PF 11/26/2016, 06/16/2018  . Influenza,inj,Quad PF,6+ Mos 09/27/2013, 08/28/2017  . Influenza-Unspecified 07/13/2012  . PPD Test 12/27/2012  . Pneumococcal Conjugate-13 08/28/2017  . Pneumococcal Polysaccharide-23 09/12/2006    Current Medications, Allergies, Past Medical History, Past Surgical History, Family History, and Social History were reviewed in [employer] record.   Review of Systems             All symptoms NEG except where BOLDED >>  Constitutional:  F/C/S,  fatigue, anorexia, unexpected weight change. HEENT:  HA, visual changes, hearing loss, earache, nasal symptoms, sore throat, mouth sores, hoarseness. Resp:  cough, sputum, hemoptysis; SOB, tightness, wheezing. Cardio:  CP, palpit, DOE, orthopnea, edema. GI:  N/V/D/C, blood in stool; reflux, abd pain, distention, gas. GU:  dysuria, freq, urgency, hematuria, flank pain, voiding difficulty. MS:  joint pain, swelling, tenderness, decr ROM; neck pain, back pain, etc. Neuro:  HA, tremors, seizures, dizziness, syncope, weakness, numbness, gait abn. Skin:  suspicious lesions or skin rash. Heme:  adenopathy, bruising, bleeding. Psyche:  confusion, agitation, sleep disturbance, hallucinations, anxiety, depression suicidal.   Objective:   Physical Exam       Vital Signs:  Reviewed...   General:  WD, WN, 72 y/o WF in NAD; alert & oriented;  pleasant & cooperative... HEENT:  [place]/AT; Conjunctiva- pink, Sclera- nonicteric, EOM-wnl, PERRLA, EACs-clear, TMs-wnl; NOSE-congested; THROAT-clear & wnl.  Neck:  Supple w/ fair ROM; no JVD; normal carotid impulses w/o bruits; no thyromegaly or nodules palpated; no lymphadenopathy.  Chest:  decr BS at bases but clear w/o rales/ rhonchi/ or signs of consolidation... Heart:  Regular Rhythm; norm S1 & S2 without murmurs, rubs, or gallops detected. Abdomen:  Soft & nontender- no guarding or rebound; normal bowel sounds; no organomegaly or masses palpated. Ext:  Normal ROM; without deformities or arthritic changes; no varicose veins, venous insuffic, or edema;  Pulses intact w/o bruits. Neuro:  No focal neuro deficits, sensory testing normal; gait normal & balance OK. Derm:  No lesions noted; no rash etc. Lymph:  No cervical, supraclavicular, axillary, or inguinal adenopathy palpated.   Assessment:      IMP >>    Asthma/ AB/ & COPD based on min smoking hx & what sounds like long-term poor airway control> mod persistent, chronic bronchitic type, with a reversible  component; FEV1 12/2015= 1.25 (54%); she quit smoking 1984 & has only a 5 pack-yr smoking hx; due to cost issues we have tried to optimize her therapy w/ min expense & tried NEBS w/ DUONEB Qid and BUDESONIDE 0.25Bid but she claims the nebulizer makes her breathing worse; she feels that she does well on BREO + INCRUSE but cost is an issue in the donut hole, where she uses her rescue inhale too often...    Hx allergic rhinitis> eval by DrSharma, we do not have his notes, but pt reports that allergy testing was NEG, despite hx nasal polyposis etc...    Hx OSA> sleep study and rx per DrMcKeown in 2015, we do not have any of this data avail to review...    ENT> per DrTeoh w/ facial pressure & nasal obstruction- eval w/ CT Sinus & flex endoscopy revealed bilat nasal polyps, deviated septum, bilat inferior turbinate hypertrophy, & a large right concha bullosa => s/p endoscopic sinus surg 01/2016 and she reports much improved & able to breathe thru her nose for awhile until her congestion returned; Rec to resume her nasal irrigations....    Medical issues> per CJeffery,PA w/ DrBouska at [facility]>  HBP, HL, Hypothyroid, GERD, Hx kidney cancer- s/p left nephrectomy, prob secondary hyperpara, osteoporosis (DrFernandez), Anxiety/Depression...  PLAN >>  12/24/15>   [name] [name] has done well on the [facility] one inhalation daily but the $300/mo is prohibitive; we discussed trial of NEBULIZED medications to see if they work as well and are less expensive; In this regard- start DUONEB BID via Nebulizer and BUDESONIDE 0.25 BID via Nebulizer; she will continue to use the VentolinHFA as needed and her other meds as listed... 03/17/16>    Problems as noted, she is improved overall but the Bid NEB sched is not working long enough- she still prefers the NEBs over the inhalers due to cost; Rec to incr Duoneb to Qid, continue Budesonide Bid;  SHE NEEDS A NEW SMALLER PORTABLE NEB UNIT=> we will request thru her DME company;  She  will try to get [place] sleep apnea data (sleep study date & results, machine info & download data;  We plan ROV in 20mo sooner if needed prn... 09/16/16>    We discussed her dilemma (freq exac & ?worse w/ neb rx) and decided on RX w/ BREO- one inhalation daily, INCRUSE- one inhalation daily, ProairHFA rescue inhaler vs NEB w/ Duoneb as needed;  She will also start  Rx w/ her nasal saline irrigations (per DrTeoh), and MUCINEX 623m Qid w/ fluids;  For her acute prob we will Rx w/ Depo80 + MEDROL Dosepak (she doesn't like Pred);  Finally we will pursue further eval w/ IgE/RAST testing & check a CBC w/ diff (off Pred x several months now) to check EOS & consider a biological if indicated. 11/26/16>   FManus Gunningis much improved after Medrol given 02/2016; she has maintained Breo/ Incruse &remains stable at this time therefore does NOT want to consider a biologic for her asthma (+candidate for Nucala etc);  rec to continue same meds, ok flu vaccine today...  09/15/18>   FManus Gunningreturns [facility]- she has been doing satis on this med w/o asthma exac over the last 2 yrs!  She is a candidate for a biologic (Geradine Girt Fasenra, etc) but she has done remarkably well on the BREO alone & wants to continue this med;  Breo samples given & inhaler refilled;  As I am retiring at the end of the month- we will request future refills be provided by her PCP as long as she remains well controlled, [facility] any future problems or care needs...     Plan:     Patient's Medications  New Prescriptions   FLUTICASONE FUROATE-VILANTEROL (BREO ELLIPTA) 200-25 MCG/INH AEPB    Inhale 1 puff into the lungs daily.  Previous Medications   ALBUTEROL (PROAIR HFA) 108 (90 BASE) MCG/ACT INHALER    Inhale 1-2 puffs into the lungs every 4 (four) hours as needed for wheezing or shortness of breath.   ALPRAZOLAM (XANAX) 0.5 MG TABLET    Take 1-2 tablets (0.5-1 mg total) by mouth at bedtime as needed for sleep.   AMLODIPINE  (NORVASC) 5 MG TABLET    Take 0.5-1 tablets (2.5-5 mg total) by mouth daily.   BRIMONIDINE (ALPHAGAN) 0.2 % OPHTHALMIC SOLUTION       CETIRIZINE (ZYRTEC) 10 MG TABLET    Take 10 mg by mouth as needed for allergies.   CHOLECALCIFEROL (HM VITAMIN D3) 4000 UNITS CAPS    Take 1 capsule by mouth daily.   CO-ENZYME Q-10 50 MG CAPSULE    Take 50 mg by mouth daily.   LEVOTHYROXINE (SYNTHROID, LEVOTHROID) 100 MCG TABLET    TAKE 1 TABLET EVERY DAY   LOSARTAN (COZAAR) 50 MG TABLET    Take 50 mg by mouth daily.   NONFORMULARY OR COMPOUNDED ITEM    cbd oil drops   PANTOPRAZOLE (PROTONIX) 40 MG TABLET    Take 40 mg by mouth daily.   PROBIOTIC PRODUCT (PROBIOTIC PO)    Take 1 tablet by mouth at bedtime.    RHOPRESSA 0.02 % SOLN       ROSUVASTATIN (CRESTOR) 10 MG TABLET    Take 10 mg by mouth daily.   TRIAMCINOLONE CREAM (KENALOG) 0.1 %    APPLY TO AFFECTED AREA UP TO TWICE DAILY AS NEEDED FOR ECZEMA (NOT TO FACE, GROIN, UNDERARMS)   VENLAFAXINE XR (EFFEXOR-XR) 150 MG 24 HR CAPSULE    Take 1 capsule (150 mg total) daily with breakfast by mouth.   ZOLEDRONIC ACID (RECLAST IV)    Inject into the vein.  Modified Medications   Modified Medication Previous Medication   FLUTICASONE FUROATE-VILANTEROL (BREO ELLIPTA) 200-25 MCG/INH AEPB fluticasone furoate-vilanterol (BREO ELLIPTA) 200-25 MCG/INH AEPB      Inhale 1 puff into the lungs daily.    Inhale 1 puff into the lungs daily.  Discontinued  Medications   ALPRAZOLAM (XANAX) 0.5 MG TABLET    Take 1-2 tablets (0.5-1 mg total) by mouth at bedtime as needed for sleep.   ALPRAZOLAM (XANAX) 0.5 MG TABLET    Take 1-2 tablets (0.5-1 mg total) by mouth at bedtime as needed for sleep.   DOXYCYCLINE (VIBRA-TABS) 100 MG TABLET    Take 1 tablet (100 mg total) by mouth 2 (two) times daily.

## 2019-05-20 ENCOUNTER — Telehealth: Payer: Self-pay | Admitting: *Deleted

## 2019-05-20 NOTE — Telephone Encounter (Addendum)
Recast instructions  Annual Exam (1 year) ? NEEDS ANNUALEXAM  Called pt to verify insurance coverage / inform pt Reclast is in Process ?  Labs must be in 30 days window Serum Creatinine DATE? NEEDS LABS Serum Calcium DATE? NEEDS LABS   PA needed? no  Cost for Pt? $130  Order form filled out and faxed  w/MD sig?  Infusion will be done at ?  Pt aware?  Instructions mailed to pt?

## 2019-05-31 NOTE — Telephone Encounter (Signed)
Pt states she has being experiencing hot flashes.Was refered to ENT. Pt stated ENT  recommended  her to skip a year of doing Reclast.   Pt asked me to asks to Elon Alas NP for advice.

## 2019-06-01 NOTE — Telephone Encounter (Signed)
Message left

## 2019-06-06 NOTE — Telephone Encounter (Signed)
Message left

## 2019-06-06 NOTE — Telephone Encounter (Signed)
Return telephone call, message left

## 2019-06-07 NOTE — Telephone Encounter (Signed)
Message left from Manus Gunning, states is looking into Prolia which she has taken in the past but then had insurance issues not covering.  Will call back on decision if wants to proceed with Prolia.

## 2019-07-20 ENCOUNTER — Encounter: Payer: Self-pay | Admitting: Gynecology

## 2019-08-22 ENCOUNTER — Other Ambulatory Visit: Payer: Self-pay | Admitting: Women's Health

## 2019-09-14 ENCOUNTER — Other Ambulatory Visit: Payer: Self-pay | Admitting: Physician Assistant

## 2019-09-14 DIAGNOSIS — Z1231 Encounter for screening mammogram for malignant neoplasm of breast: Secondary | ICD-10-CM

## 2019-09-22 NOTE — Telephone Encounter (Signed)
Acyclovir 400 mg for 3-5 days

## 2019-09-22 NOTE — Telephone Encounter (Signed)
[name] [name] does NOT need acyclovir, I thought  I was sending for previous pt correcting  the 3-5 days YIKES  hopefully you can correct for me!

## 2019-11-04 ENCOUNTER — Ambulatory Visit: Payer: Medicare Other

## 2019-11-28 ENCOUNTER — Telehealth: Payer: Self-pay

## 2019-11-28 NOTE — Telephone Encounter (Signed)
F/U with PCP

## 2019-11-28 NOTE — Telephone Encounter (Signed)
pt called and wanted a refill on losartan and rosuvastatin, she has not bee seen since 2018, I tried calling her but her VM was full. Do we refill and tell her to make appointment or have her PCP fill, please advise.

## 2019-11-29 NOTE — Telephone Encounter (Signed)
VM full cannot leave message.  

## 2019-11-29 NOTE — Telephone Encounter (Signed)
S/w pt advised her to f/u with PCP

## 2019-12-09 ENCOUNTER — Other Ambulatory Visit: Payer: Self-pay

## 2019-12-09 ENCOUNTER — Ambulatory Visit
Admission: RE | Admit: 2019-12-09 | Discharge: 2019-12-09 | Disposition: A | Discharge status: Home / Self Care | Payer: Medicare Other | Source: Ambulatory Visit | Attending: Physician Assistant | Admitting: Physician Assistant

## 2019-12-09 DIAGNOSIS — Z1231 Encounter for screening mammogram for malignant neoplasm of breast: Secondary | ICD-10-CM

## 2020-03-06 ENCOUNTER — Telehealth: Payer: Self-pay

## 2020-03-06 NOTE — Telephone Encounter (Signed)
Probably see PCP and then referral to [place] if needed

## 2020-08-02 ENCOUNTER — Other Ambulatory Visit: Payer: Self-pay

## 2020-08-02 ENCOUNTER — Ambulatory Visit: Payer: Medicare Other | Admitting: Pulmonary Disease

## 2020-08-02 ENCOUNTER — Encounter: Payer: Self-pay | Admitting: Pulmonary Disease

## 2020-08-02 VITALS — BP 134/78 | HR 109 | Temp 97.6°F | Ht 66.0 in | Wt 176.0 lb

## 2020-08-02 DIAGNOSIS — R0683 Snoring: Secondary | ICD-10-CM

## 2020-08-02 DIAGNOSIS — J449 Chronic obstructive pulmonary disease, unspecified: Secondary | ICD-10-CM

## 2020-08-02 MED ORDER — BREO ELLIPTA 200-25 MCG/INH IN AEPB: 1.0000 | INHALATION_SPRAY | Freq: Every day | RESPIRATORY_TRACT | 0 refills | Status: AC

## 2020-08-02 MED ORDER — ALBUTEROL SULFATE HFA 108 (90 BASE) MCG/ACT IN AERS: 1.0000 | INHALATION_SPRAY | RESPIRATORY_TRACT | 11 refills | Status: DC | PRN

## 2020-08-02 NOTE — Patient Instructions (Addendum)
Continue breo ellipta daily and as needed albuterol We will order a home sleep study Discuss with your primary care about adding mirtazapine for your anxiety/depression and sleep concerns  Follow up in 12 months, call if needed

## 2020-08-02 NOTE — Progress Notes (Signed)
Synopsis: Return visit for COPD  Subjective:   PATIENT ID: [name] [name] DOB: [birth year], MRN: [id number]   HPI  Chief Complaint  Patient presents with  . Follow-up    shortness of breath with exertion on Breo 200 daily    [name] [name] is a 74 year old woman, former smoker with history of asthma and COPD who returns to clinic for follow up.   She reports she has been doing well since her last visit with Dr. Lenna Gilford. She did not require prednisone in the past year for her breathing and did not have any urgent care or ER visits for her breathing. She does report instances with some dyspnea but she feels this may be related to anxiety which she has been having over recent months. She does complain of some fatigue and some snoring. She had significant snoring in the past but had sinus surgery in 2017 and has had significant improvement in her breathing and sleep since then.     Past Medical History:  Diagnosis Date  . Allergy   . Anxiety   . Asthma   . BCC (basal cell carcinoma), leg, left 08/2017  . Cancer ([place])    Kidney cancer-Hypernephroma  . Cervical dysplasia   . COPD (chronic obstructive pulmonary disease) ([place])   . Depression   . Elevated cholesterol   . GERD (gastroesophageal reflux disease)   . Glaucoma   . Hypertension   . Lymphocytic colitis   . Osteoporosis 04/2018   2016 T score -3.2 2019 T score -2.8  . SCC (squamous cell carcinoma), leg, right 08/2017  . Serum calcium elevated      Family History  Problem Relation Age of Onset  . Hypertension Mother   . Heart disease Mother   . Asthma Mother   . Mental illness Mother        depression and anxiety  . Lung cancer Father   . Cancer Father        lung  . Hypertension Sister   . Cancer Sister        Stem cell cancer  . Hypertension Brother   . Stroke Brother   . Cancer Brother        prostate cancer  . Ovarian cancer Maternal Aunt   . Breast cancer Maternal Aunt        Age 64's    . Cancer Maternal Uncle        Prostate cancer  . Breast cancer Maternal Aunt        Age 93's  . Hypertension Daughter   . Arthritis Daughter      Social History   Socioeconomic History  . Marital status: Married    Spouse name: Dominic  . Number of children: 1  . Years of education: 12th grade  . Highest education level: Not on file  Occupational History  . Occupation: retired 2012    Comment: sales/marketing, Data processing manager  Tobacco Use  . Smoking status: Former Smoker    Packs/day: 5.00    Years: 10.00    Pack years: 50.00    Types: Cigarettes    Quit date: 08/23/1983    Years since quitting: 36.9  . Smokeless tobacco: Never Used  Vaping Use  . Vaping Use: Never used  Substance and Sexual Activity  . Alcohol use: Yes    Alcohol/week: 21.0 standard drinks    Types: 21 Standard drinks or equivalent per week    Comment: social  . Drug use:  No    Comment: cbd hemp  . Sexual activity: Not Currently    Birth control/protection: Surgical    Comment: HYST  Other Topics Concern  . Not on file  Social History Narrative   Lives with her 2nd husband and 2 dogs.   Mother, aunt, daughter and granddaughters live nearby.   Is working on mending estranged relationships with her siblings.   Social Determinants of Health   Financial Resource Strain:   . Difficulty of Paying Living Expenses: Not on file  Food Insecurity:   . Worried About Charity fundraiser in the Last Year: Not on file  . Ran Out of Food in the Last Year: Not on file  Transportation Needs:   . Lack of Transportation (Medical): Not on file  . Lack of Transportation (Non-Medical): Not on file  Physical Activity:   . Days of Exercise per Week: Not on file  . Minutes of Exercise per Session: Not on file  Stress:   . Feeling of Stress : Not on file  Social Connections:   . Frequency of Communication with Friends and Family: Not on file  . Frequency of Social Gatherings with Friends and Family: Not on  file  . Attends Religious Services: Not on file  . Active Member of Clubs or Organizations: Not on file  . Attends Archivist Meetings: Not on file  . Marital Status: Not on file  Intimate Partner Violence:   . Fear of Current or Ex-Partner: Not on file  . Emotionally Abused: Not on file  . Physically Abused: Not on file  . Sexually Abused: Not on file     Allergies  Allergen Reactions  . Iodine     Pt has only one kidney.  . Iohexol      Code: RASH, Desc: PT STATES SHE HAS ONE KIDNEY SO NOT TO USE IV DYE/10/27/06/RM, Onset Date: [id number]   . Penicillins   . Sulfa Antibiotics Nausea Only  . Neosporin [Neomycin-Polymyxin-Gramicidin] Rash     Outpatient Medications Prior to Visit  Medication Sig Dispense Refill  . ALPRAZolam (XANAX) 0.5 MG tablet Take 1-2 tablets (0.5-1 mg total) by mouth at bedtime as needed for sleep. 60 tablet 0  . amLODipine (NORVASC) 5 MG tablet Take 0.5-1 tablets (2.5-5 mg total) by mouth daily. 45 tablet 3  . brimonidine (ALPHAGAN) 0.2 % ophthalmic solution     . cetirizine (ZYRTEC) 10 MG tablet Take 10 mg by mouth as needed for allergies.    . Cholecalciferol (HM VITAMIN D3) 4000 units CAPS Take 1 capsule by mouth daily.    Marland Kitchen co-enzyme Q-10 50 MG capsule Take 50 mg by mouth daily.    . fluticasone furoate-vilanterol (BREO ELLIPTA) 200-25 MCG/INH AEPB Inhale 1 puff into the lungs daily. 60 each 3  . levothyroxine (SYNTHROID, LEVOTHROID) 100 MCG tablet TAKE 1 TABLET EVERY DAY 90 tablet 1  . losartan (COZAAR) 50 MG tablet Take 50 mg by mouth daily.  2  . NONFORMULARY OR COMPOUNDED ITEM cbd oil drops    . pantoprazole (PROTONIX) 40 MG tablet Take 40 mg by mouth daily.    . Probiotic Product (PROBIOTIC PO) Take 1 tablet by mouth at bedtime.     . RHOPRESSA 0.02 % SOLN     . rosuvastatin (CRESTOR) 10 MG tablet Take 10 mg by mouth daily.  2  . triamcinolone cream (KENALOG) 0.1 % APPLY TO AFFECTED AREA UP TO TWICE DAILY AS NEEDED FOR ECZEMA (NOT TO  FACE,  GROIN, UNDERARMS)  3  . venlafaxine XR (EFFEXOR-XR) 150 MG 24 hr capsule Take 1 capsule (150 mg total) daily with breakfast by mouth. 90 capsule 3  . albuterol (PROAIR HFA) 108 (90 Base) MCG/ACT inhaler Inhale 1-2 puffs into the lungs every 4 (four) hours as needed for wheezing or shortness of breath. 1 Inhaler 3  . fluticasone furoate-vilanterol (BREO ELLIPTA) 200-25 MCG/INH AEPB Inhale 1 puff into the lungs daily. 2 each 0  . Zoledronic Acid (RECLAST IV) Inject into the vein. (Patient not taking: Reported on 08/02/2020)     No facility-administered medications prior to visit.    Review of Systems  Constitutional: Positive for malaise/fatigue. Negative for chills, diaphoresis, fever and weight loss.  HENT: Negative for congestion, sinus pain and sore throat.   Eyes: Negative.   Respiratory: Negative for cough, hemoptysis, sputum production, shortness of breath and wheezing.   Cardiovascular: Negative for chest pain, palpitations, orthopnea, claudication and leg swelling.  Gastrointestinal: Negative for abdominal pain, blood in stool, constipation, diarrhea, heartburn, nausea and vomiting.  Genitourinary: Negative for dysuria and hematuria.  Musculoskeletal: Negative for joint pain and myalgias.  Skin: Negative for rash.  Neurological: Negative for dizziness, weakness and headaches.  Endo/Heme/Allergies: Does not bruise/bleed easily.  Psychiatric/Behavioral: Positive for depression. The patient is nervous/anxious and has insomnia.     Objective:   Vitals:   08/02/20 1356  BP: 134/78  Pulse: (!) 109  Temp: 97.6 F (36.4 C)  TempSrc: Temporal  SpO2: 97%  Weight: 176 lb (79.8 kg)  Height: 5\' 6"  (1.676 m)     Physical Exam Constitutional:      General: She is not in acute distress.    Appearance: Normal appearance. She is normal weight. She is not ill-appearing.  HENT:     Head: Normocephalic and atraumatic.     Nose: Nose normal.     Mouth/Throat:     Mouth: Mucous  membranes are moist.     Pharynx: Oropharynx is clear.  Eyes:     General: No scleral icterus.    Conjunctiva/sclera: Conjunctivae normal.     Pupils: Pupils are equal, round, and reactive to light.  Cardiovascular:     Rate and Rhythm: Normal rate and regular rhythm.     Pulses: Normal pulses.     Heart sounds: Normal heart sounds. No murmur heard.   Pulmonary:     Effort: Pulmonary effort is normal.     Breath sounds: Normal breath sounds.  Abdominal:     General: Bowel sounds are normal.     Palpations: Abdomen is soft.  Musculoskeletal:     Cervical back: Neck supple.     Right lower leg: No edema.     Left lower leg: No edema.  Lymphadenopathy:     Cervical: No cervical adenopathy.  Skin:    General: Skin is warm and dry.  Neurological:     General: No focal deficit present.     Mental Status: She is alert and oriented to person, place, and time. Mental status is at baseline.  Psychiatric:        Mood and Affect: Mood normal.        Behavior: Behavior normal.        Thought Content: Thought content normal.        Judgment: Judgment normal.     CBC    Component Value Date/Time   WBC 7.1 04/14/2018 1608   WBC 8.7 06/17/2017 1047   WBC 9.1 09/16/2016 1315  RBC 4.61 04/14/2018 1608   RBC 4.33 06/17/2017 1047   RBC 4.77 09/16/2016 1315   HGB 13.9 04/14/2018 1608   HCT 42.6 04/14/2018 1608   PLT 222 04/14/2018 1608   MCV 92 04/14/2018 1608   MCH 30.2 04/14/2018 1608   MCH 30.1 06/17/2017 1047   MCH 30.7 11/08/2015 1652   MCHC 32.6 04/14/2018 1608   MCHC 34.4 06/17/2017 1047   MCHC 34.3 09/16/2016 1315   RDW 13.6 04/14/2018 1608   LYMPHSABS 2.4 04/14/2018 1608   MONOABS 0.6 09/16/2016 1315   EOSABS 0.5 (H) 04/14/2018 1608   BASOSABS 0.0 04/14/2018 1608   BMP Latest Ref Rng & Units 04/14/2018 03/23/2018 02/18/2018  Glucose 65 - 99 mg/dL 111(H) - -  BUN 8 - 27 mg/dL 6(L) - -  Creatinine 0.57 - 1.00 mg/dL 0.60 1.01(H) 0.96(H)  BUN/Creat Ratio 12 - 28 10(L) - -    Sodium 134 - 144 mmol/L 139 - -  Potassium 3.5 - 5.2 mmol/L 3.9 - -  Chloride 96 - 106 mmol/L 101 - -  CO2 20 - 29 mmol/L 24 - -  Calcium 8.7 - 10.3 mg/dL 9.5 9.1 9.4   Chest imaging: CXR 2018 Heart and mediastinal contours are within normal limits. No focal opacities or effusions. No acute bony abnormality.  PFT:  12/24/15>  FVC=2.40 (79%), FEV1=1.25 (54%),      Assessment & Plan:   Chronic obstructive airway disease with asthma ([place])  Snoring - Plan: Home sleep test  Discussion: [name] [name] is a 74 year old woman, former smoker with history of asthma and COPD who returns to clinic for follow up.   She has been doing well on Breo inhaler therapy since her last visit. We will continue her on this medication at this time. We have provided her with a sample today and a refill for her albuterol inhaler.   Given her fatigue and history of obstructive sleep apnea, I have recommended we repeat a sleep study which she has agreed to and a home sleep study order has been placed.    We will call her with the sleep study results and she is to follow up in 12 months.  Freda Jackson, MD [facility] Office: [phone]   Current Outpatient Medications:  .  albuterol (PROAIR HFA) 108 (90 Base) MCG/ACT inhaler, Inhale 1-2 puffs into the lungs every 4 (four) hours as needed for wheezing or shortness of breath., Disp: 18 g, Rfl: 11 .  ALPRAZolam (XANAX) 0.5 MG tablet, Take 1-2 tablets (0.5-1 mg total) by mouth at bedtime as needed for sleep., Disp: 60 tablet, Rfl: 0 .  amLODipine (NORVASC) 5 MG tablet, Take 0.5-1 tablets (2.5-5 mg total) by mouth daily., Disp: 45 tablet, Rfl: 3 .  brimonidine (ALPHAGAN) 0.2 % ophthalmic solution, , Disp: , Rfl:  .  cetirizine (ZYRTEC) 10 MG tablet, Take 10 mg by mouth as needed for allergies., Disp: , Rfl:  .  Cholecalciferol (HM VITAMIN D3) 4000 units CAPS, Take 1 capsule by mouth daily., Disp: , Rfl:  .  co-enzyme Q-10 50 MG  capsule, Take 50 mg by mouth daily., Disp: , Rfl:  .  fluticasone furoate-vilanterol (BREO ELLIPTA) 200-25 MCG/INH AEPB, Inhale 1 puff into the lungs daily., Disp: 60 each, Rfl: 3 .  levothyroxine (SYNTHROID, LEVOTHROID) 100 MCG tablet, TAKE 1 TABLET EVERY DAY, Disp: 90 tablet, Rfl: 1 .  losartan (COZAAR) 50 MG tablet, Take 50 mg by mouth daily., Disp: , Rfl: 2 .  NONFORMULARY OR COMPOUNDED ITEM, cbd oil drops, Disp: , Rfl:  .  pantoprazole (PROTONIX) 40 MG tablet, Take 40 mg by mouth daily., Disp: , Rfl:  .  Probiotic Product (PROBIOTIC PO), Take 1 tablet by mouth at bedtime. , Disp: , Rfl:  .  RHOPRESSA 0.02 % SOLN, , Disp: , Rfl:  .  rosuvastatin (CRESTOR) 10 MG tablet, Take 10 mg by mouth daily., Disp: , Rfl: 2 .  triamcinolone cream (KENALOG) 0.1 %, APPLY TO AFFECTED AREA UP TO TWICE DAILY AS NEEDED FOR ECZEMA (NOT TO FACE, GROIN, UNDERARMS), Disp: , Rfl: 3 .  venlafaxine XR (EFFEXOR-XR) 150 MG 24 hr capsule, Take 1 capsule (150 mg total) daily with breakfast by mouth., Disp: 90 capsule, Rfl: 3 .  fluticasone furoate-vilanterol (BREO ELLIPTA) 200-25 MCG/INH AEPB, Inhale 1 puff into the lungs daily., Disp: 1 each, Rfl: 0 .  Zoledronic Acid (RECLAST IV), Inject into the vein. (Patient not taking: Reported on 08/02/2020), Disp: , Rfl:

## 2020-10-03 ENCOUNTER — Encounter: Payer: Medicare Other | Admitting: Nurse Practitioner

## 2020-11-01 ENCOUNTER — Encounter: Payer: Medicare Other | Admitting: Nurse Practitioner

## 2020-11-01 ENCOUNTER — Ambulatory Visit: Payer: Medicare Other | Admitting: Nurse Practitioner

## 2020-11-01 ENCOUNTER — Other Ambulatory Visit: Payer: Self-pay

## 2020-11-01 ENCOUNTER — Encounter: Payer: Self-pay | Admitting: Nurse Practitioner

## 2020-11-01 VITALS — BP 162/84 | HR 90 | Resp 14 | Ht 65.25 in | Wt 177.2 lb

## 2020-11-01 DIAGNOSIS — Z90710 Acquired absence of both cervix and uterus: Secondary | ICD-10-CM | POA: Diagnosis not present

## 2020-11-01 DIAGNOSIS — Z01419 Encounter for gynecological examination (general) (routine) without abnormal findings: Secondary | ICD-10-CM | POA: Diagnosis not present

## 2020-11-01 DIAGNOSIS — M810 Age-related osteoporosis without current pathological fracture: Secondary | ICD-10-CM

## 2020-11-01 DIAGNOSIS — R232 Flushing: Secondary | ICD-10-CM

## 2020-11-01 DIAGNOSIS — Z780 Asymptomatic menopausal state: Secondary | ICD-10-CM

## 2020-11-01 NOTE — Patient Instructions (Signed)
Health Maintenance After Age 74 After age 74, you are at a higher risk for certain long-term diseases and infections as well as injuries from falls. Falls are a major cause of broken bones and head injuries in people who are older than age 74. Getting regular preventive care can help to keep you healthy and well. Preventive care includes getting regular testing and making lifestyle changes as recommended by your health care provider. Talk with your health care provider about:  Which screenings and tests you should have. A screening is a test that checks for a disease when you have no symptoms.  A diet and exercise plan that is right for you. What should I know about screenings and tests to prevent falls? Screening and testing are the best ways to find a health problem early. Early diagnosis and treatment give you the best chance of managing medical conditions that are common after age 74. Certain conditions and lifestyle choices may make you more likely to have a fall. Your health care provider may recommend:  Regular vision checks. Poor vision and conditions such as cataracts can make you more likely to have a fall. If you wear glasses, make sure to get your prescription updated if your vision changes.  Medicine review. Work with your health care provider to regularly review all of the medicines you are taking, including over-the-counter medicines. Ask your health care provider about any side effects that may make you more likely to have a fall. Tell your health care provider if any medicines that you take make you feel dizzy or sleepy.  Osteoporosis screening. Osteoporosis is a condition that causes the bones to get weaker. This can make the bones weak and cause them to break more easily.  Blood pressure screening. Blood pressure changes and medicines to control blood pressure can make you feel dizzy.  Strength and balance checks. Your health care provider may recommend certain tests to check your  strength and balance while standing, walking, or changing positions.  Foot health exam. Foot pain and numbness, as well as not wearing proper footwear, can make you more likely to have a fall.  Depression screening. You may be more likely to have a fall if you have a fear of falling, feel emotionally low, or feel unable to do activities that you used to do.  Alcohol use screening. Using too much alcohol can affect your balance and may make you more likely to have a fall. What actions can I take to lower my risk of falls? General instructions  Talk with your health care provider about your risks for falling. Tell your health care provider if: ? You fall. Be sure to tell your health care provider about all falls, even ones that seem minor. ? You feel dizzy, sleepy, or off-balance.  Take over-the-counter and prescription medicines only as told by your health care provider. These include any supplements.  Eat a healthy diet and maintain a healthy weight. A healthy diet includes low-fat dairy products, low-fat (lean) meats, and fiber from whole grains, beans, and lots of fruits and vegetables. Home safety  Remove any tripping hazards, such as rugs, cords, and clutter.  Install safety equipment such as grab bars in bathrooms and safety rails on stairs.  Keep rooms and walkways well-lit. Activity  Follow a regular exercise program to stay fit. This will help you maintain your balance. Ask your health care provider what types of exercise are appropriate for you.  If you need a cane or walker,   use it as recommended by your health care provider.  Wear supportive shoes that have nonskid soles.   Lifestyle  Do not drink alcohol if your health care provider tells you not to drink.  If you drink alcohol, limit how much you have: ? 0-1 drink a day for women. ? 0-2 drinks a day for men.  Be aware of how much alcohol is in your drink. In the [place] one drink equals one typical bottle of beer (12  oz), one-half glass of wine (5 oz), or one shot of hard liquor (1 oz).  Do not use any products that contain nicotine or tobacco, such as cigarettes and e-cigarettes. If you need help quitting, ask your health care provider. Summary  Having a healthy lifestyle and getting preventive care can help to protect your health and wellness after age 74.  Screening and testing are the best way to find a health problem early and help you avoid having a fall. Early diagnosis and treatment give you the best chance for managing medical conditions that are more common for people who are older than age 74.  Falls are a major cause of broken bones and head injuries in people who are older than age 74. Take precautions to prevent a fall at home.  Work with your health care provider to learn what changes you can make to improve your health and wellness and to prevent falls. This information is not intended to replace advice given to you by your health care provider. Make sure you discuss any questions you have with your health care provider. Document Revised: 01/20/2019 Document Reviewed: 08/12/2017 Elsevier Patient Education  2021 Elsevier Inc.  

## 2020-11-01 NOTE — Progress Notes (Signed)
[name] [name] [birth year] [id number]   History:  75 y.o. G1P1001 presents for breast and pelvic exam. 1989 TAH for fibroids, no HRT. Normal pap and mammogram history. Osteoporosis, has been on Prolia and Reclast in the past (last dose was August 2020). A couple of years ago she began having hot flashes and was seen by endocrinology with negative workup with recommendations to stop Reclast x 1 year. She still has hot flashes but they are not as frequent.   Gynecologic History No LMP recorded. Patient has had a hysterectomy.   Health Maintenance Last Pap: No longer screening per guidelines Last mammogram: 11/2019. Results were: normal Last colonoscopy: 2018. Results were: polyp, 5 year recall Last Dexa: 04/2018. Results were: t-score -2.8 of bilateral femoral necks  Past medical history, past surgical history, family history and social history were all reviewed and documented in the EPIC chart.  ROS:  A ROS was performed and pertinent positives and negatives are included.  Exam:  Vitals:   11/01/20 1344  BP: (!) 162/84  Pulse: 90  Resp: 14  Weight: 177 lb 4 oz (80.4 kg)  Height: 5' 5.25" (1.657 m)   Body mass index is 29.27 kg/m.  General appearance:  Normal Thyroid:  Symmetrical, normal in size, without palpable masses or nodularity. Respiratory  Auscultation:  Clear without wheezing or rhonchi Cardiovascular  Auscultation:  Regular rate, without rubs, murmurs or gallops  Edema/varicosities:  Not grossly evident Abdominal  Soft,nontender, without masses, guarding or rebound.  Liver/spleen:  No organomegaly noted  Hernia:  None appreciated  Skin  Inspection:  Grossly normal   Breasts: Examined lying and sitting.   Right: Without masses, retractions, discharge or axillary adenopathy.   Left: Without masses, retractions, discharge or axillary adenopathy. Gentitourinary   Inguinal/mons:  Normal without inguinal adenopathy  External genitalia:   Normal  BUS/Urethra/Skene's glands:  Normal  Vagina:  Normal  Cervix:  Absent  Uterus:  Absent  Adnexa/parametria:     Rt: Without masses or tenderness.   Lt: Without masses or tenderness.  Anus and perineum: Normal  Digital rectal exam: Normal sphincter tone without palpated masses or tenderness  Assessment/Plan:  75 y.o. G1P1001 for annual exam.   Well female exam with routine gynecological exam - Education provided on SBEs, importance of preventative screenings, current guidelines, high calcium diet, regular exercise, and multivitamin daily. Labs with PCP.   Osteoporosis without current pathological fracture, unspecified osteoporosis type - Plan: DG Bone Density. Has been on Prolia and Reclast in the past.  Endocrinology recommended to stop Reclast x1 year to see if hot flashes improved.  She still has hot flashes but they are not as frequent.  She is due for bone density and will schedule this soon.  We will discuss treatment at that time.  Hot flashes -began having hot flashes a couple of years ago and associated them with Reclast.  Had negative work-up with endocrinology and still has hot flashes but they are less frequent.  She plans to establish with new PCP soon and will discuss further evaluation with them.  History of total abdominal hysterectomy -1989 TAH for fibroids, no HRT.  Postmenopausal - Plan: DG Bone Density  Screening for cervical cancer - Normal Pap history.  No longer screening per guidelines  Screening for breast cancer - Normal mammogram history.  Continue annual screenings.  Normal breast exam today.  Screening for colon cancer -2018 colonoscopy.  Will repeat at GI's recommended interval.   Follow-up in 1 year for  annual.   Tamela Gammon [facility], 2:07 PM 11/01/2020

## 2020-11-02 ENCOUNTER — Encounter: Payer: Self-pay | Admitting: Pulmonary Disease

## 2021-04-04 ENCOUNTER — Encounter: Payer: Self-pay | Admitting: Obstetrics and Gynecology

## 2021-04-04 ENCOUNTER — Other Ambulatory Visit: Payer: Self-pay

## 2021-04-04 ENCOUNTER — Ambulatory Visit: Payer: Medicare Other | Admitting: Obstetrics and Gynecology

## 2021-04-04 VITALS — BP 124/84 | Temp 99.2°F

## 2021-04-04 DIAGNOSIS — N9089 Other specified noninflammatory disorders of vulva and perineum: Secondary | ICD-10-CM

## 2021-04-04 DIAGNOSIS — F439 Reaction to severe stress, unspecified: Secondary | ICD-10-CM

## 2021-04-04 DIAGNOSIS — R350 Frequency of micturition: Secondary | ICD-10-CM | POA: Diagnosis not present

## 2021-04-04 DIAGNOSIS — R102 Pelvic and perineal pain: Secondary | ICD-10-CM

## 2021-04-04 NOTE — Progress Notes (Signed)
GYNECOLOGY  VISIT   HPI: 75 y.o.   Married White or Caucasian Not Hispanic or Latino  female   G1P1001 with No LMP recorded. Patient has had a hysterectomy.  H/O TAH/LSO here for pelvic pain,occas dysuria She c/o a 2 days h/o intermittent suprapubic pain. The pain is a dull ache, ranges from a 3-7/10 in severity. The pain is there unless she is sleeping.  The pain feels cramping when she has to have a BM.  She has one kidney, so she drinks a lot of water. She is voiding more than normal, normal amounts. No dysuria.  No fevers, but has some chills.  No bowel changes in the last 2 days.  She c/o some left vulvar tenderness intermittently for 1.5 years. Occasionally itching.   She had an incident of pain in her pelvis a year ago. She slept for 2 days and then felt better.  H/O colitis, but that pain has improved. Her bowels can be normal or loose, no change  She has been under a lot of stress. Daughter is a drug addict. She is trying to stay out of the unhealthy relationship with her Daughter.   H/O osteoporosis, she states she is overdue for her DEXA. It was ordered by Marny Lowenstein in 1/22. I encouraged her to schedule it.   GYNECOLOGIC HISTORY: No LMP recorded. Patient has had a hysterectomy. Contraception:None Menopausal hormone therapy: none        OB History     Gravida  1   Para  1   Term  1   Preterm      AB      Living  1      SAB      IAB      Ectopic      Multiple      Live Births                 Patient Active Problem List   Diagnosis Date Noted   GAD (generalized anxiety disorder) 06/18/2017   Chronic left-sided low back pain with left-sided sciatica 06/18/2017   Lymphocytic colitis 06/18/2017   Single kidney 05/05/2017   Depression 05/05/2017   Eosinophilia 11/27/2016   H/O sleep apnea 11/27/2016   Normocalcemic Hyperparathyroidism ([place]) 11/21/2016   Osteoporosis 11/21/2016   Allergic rhinitis 03/17/2016   Multiple nasal polyps  03/17/2016   Chronic obstructive airway disease with asthma ([place]) 12/24/2015   Vitamin D Deficiency 02/14/2014   Other abnormal glucose 02/14/2014   Encounter for long-term (current) use of other medications 12/13/2013   Hypertension    Hyperlipidemia    Cervical dysplasia    DUB (dysfunctional uterine bleeding)    Hypothyroidism 11/30/2007    Past Medical History:  Diagnosis Date   Allergy    Anxiety    Asthma    BCC (basal cell carcinoma), leg, left 08/2017   Cancer (HCC)    Kidney cancer-Hypernephroma   Cervical dysplasia    COPD (chronic obstructive pulmonary disease) (HCC)    Depression    Elevated cholesterol    GERD (gastroesophageal reflux disease)    Glaucoma    Hypertension    Lymphocytic colitis    Osteoporosis 04/2018   2016 T score -3.2 2019 T score -2.8   SCC (squamous cell carcinoma), leg, right 08/2017   Serum calcium elevated     Past Surgical History:  Procedure Laterality Date   ABDOMINAL HYSTERECTOMY  1989   TAH LSO   AUGMENTATION MAMMAPLASTY  Implants removed   COLPOSCOPY     COSMETIC SURGERY     EYE SURGERY  2017   KIDNEY REMOVED  2002   Left   NOSE SURGERY  2017   OOPHORECTOMY     LSO   TONSILLECTOMY      Current Outpatient Medications  Medication Sig Dispense Refill   ALPRAZolam (XANAX) 0.5 MG tablet Take 1-2 tablets (0.5-1 mg total) by mouth at bedtime as needed for sleep. 60 tablet 0   amLODipine (NORVASC) 5 MG tablet Take 0.5-1 tablets (2.5-5 mg total) by mouth daily. 45 tablet 3   brimonidine (ALPHAGAN) 0.2 % ophthalmic solution      cetirizine (ZYRTEC) 10 MG tablet Take 10 mg by mouth as needed for allergies.     Cholecalciferol 100 MCG (4000 UT) CAPS Take 1 capsule by mouth daily.     co-enzyme Q-10 50 MG capsule Take 50 mg by mouth daily.     fluticasone furoate-vilanterol (BREO ELLIPTA) 200-25 MCG/INH AEPB Inhale 1 puff into the lungs daily. 60 each 3   fluticasone furoate-vilanterol (BREO ELLIPTA) 200-25 MCG/INH AEPB  Inhale 1 puff into the lungs daily. 1 each 0   levothyroxine (SYNTHROID, LEVOTHROID) 100 MCG tablet TAKE 1 TABLET EVERY DAY 90 tablet 1   losartan (COZAAR) 50 MG tablet Take 50 mg by mouth daily.  2   pantoprazole (PROTONIX) 40 MG tablet Take 40 mg by mouth daily.     Probiotic Product (PROBIOTIC PO) Take 1 tablet by mouth at bedtime.      RHOPRESSA 0.02 % SOLN      rosuvastatin (CRESTOR) 10 MG tablet Take 10 mg by mouth daily.  2   triamcinolone cream (KENALOG) 0.1 % APPLY TO AFFECTED AREA UP TO TWICE DAILY AS NEEDED FOR ECZEMA (NOT TO FACE, GROIN, UNDERARMS)  3   venlafaxine XR (EFFEXOR-XR) 150 MG 24 hr capsule Take 1 capsule (150 mg total) daily with breakfast by mouth. 90 capsule 3   Zoledronic Acid (RECLAST IV) Inject into the vein. (Patient not taking: No sig reported)     No current facility-administered medications for this visit.     ALLERGIES: Mirtazapine, Iodine, Iohexol, Penicillins, Sulfa antibiotics, and Neosporin [neomycin-polymyxin-gramicidin]  Family History  Problem Relation Age of Onset   Hypertension Mother    Heart disease Mother    Asthma Mother    Mental illness Mother        depression and anxiety   Lung cancer Father    Cancer Father        lung   Hypertension Sister    Cancer Sister        Stem cell cancer   Hypertension Brother    Stroke Brother    Cancer Brother        prostate cancer   Ovarian cancer Maternal Aunt    Breast cancer Maternal Aunt        Age 74's   Cancer Maternal Uncle        Prostate cancer   Breast cancer Maternal Aunt        Age 46's   Hypertension Daughter    Arthritis Daughter     Social History   Socioeconomic History   Marital status: Married    Spouse name: Dominic   Number of children: 1   Years of education: 12th grade   Highest education level: Not on file  Occupational History   Occupation: retired 2012    Comment: sales/marketing, Data processing manager  Tobacco Use   Smoking status:  Former    Packs/day: 5.00     Years: 10.00    Pack years: 50.00    Types: Cigarettes    Quit date: 08/23/1983    Years since quitting: 37.6   Smokeless tobacco: Never  Vaping Use   Vaping Use: Never used  Substance and Sexual Activity   Alcohol use: Yes    Alcohol/week: 21.0 standard drinks    Types: 21 Standard drinks or equivalent per week    Comment: social   Drug use: No    Comment: cbd hemp   Sexual activity: Not Currently    Birth control/protection: Surgical    Comment: HYST  Other Topics Concern   Not on file  Social History Narrative   Lives with her 2nd husband and 2 dogs.   Mother, aunt, daughter and granddaughters live nearby.   Is working on mending estranged relationships with her siblings.   Social Determinants of Health   Financial Resource Strain: Not on file  Food Insecurity: Not on file  Transportation Needs: Not on file  Physical Activity: Not on file  Stress: Not on file  Social Connections: Not on file  Intimate Partner Violence: Not on file    ROS  PHYSICAL EXAMINATION:    BP 124/84 (BP Location: Right Arm, Patient Position: Sitting, Cuff Size: Normal)   Temp 99.2 F (37.3 C) (Oral)     General appearance: alert, cooperative and appears stated age CVA: not tender Abdomen: soft, non-tender; non distended, no masses,  no organomegaly  Pelvic: External genitalia:  atrophic, no lesions, small laceration on her left labia minora.               Urethra:  normal appearing urethra with no masses, tenderness or lesions              Bartholins and Skenes: normal                 Vagina: atrophic appearing vagina with normal color and discharge, no lesions              Cervix: absent              Bimanual Exam:  Uterus:  uterus absent              Adnexa: no mass, fullness, tenderness              Bladder: not tender  Pelvic floor: not tender.   Chaperone was present for exam.  1. Pelvic pain No concerning findings on exam. Suspect the increased stress is affecting her  bowels and causing her pain. If pain keeps up f/u with GI  2. Vulvar irritation Small laceration Use vaseline Vulvar skin care information given  3. Urinary frequency Dip with minimal WBC, will send culture - Urinalysis,Complete w/RFL Culture  4. Stress She is trying to reduce her stress.   26 minutes in total patient care.

## 2021-04-06 LAB — URINALYSIS, COMPLETE W/RFL CULTURE
Bacteria, UA: NONE SEEN /HPF
Bilirubin Urine: NEGATIVE
Glucose, UA: NEGATIVE
Hgb urine dipstick: NEGATIVE
Hyaline Cast: NONE SEEN /LPF
Ketones, ur: NEGATIVE
Nitrites, Initial: NEGATIVE
Protein, ur: NEGATIVE
RBC / HPF: NONE SEEN /HPF (ref 0–2)
Specific Gravity, Urine: 1.01 (ref 1.001–1.035)
pH: 5.5 (ref 5.0–8.0)

## 2021-04-06 LAB — CULTURE INDICATED

## 2021-04-06 LAB — URINE CULTURE
MICRO NUMBER:: 12042439
SPECIMEN QUALITY:: ADEQUATE

## 2021-04-20 ENCOUNTER — Emergency Department (HOSPITAL_COMMUNITY)
Admission: EM | Admit: 2021-04-20 | Discharge: 2021-04-20 | Disposition: A | Discharge status: Home / Self Care | Payer: Medicare Other | Attending: Emergency Medicine | Admitting: Emergency Medicine

## 2021-04-20 ENCOUNTER — Encounter (HOSPITAL_COMMUNITY): Payer: Self-pay | Admitting: *Deleted

## 2021-04-20 ENCOUNTER — Other Ambulatory Visit: Payer: Self-pay

## 2021-04-20 DIAGNOSIS — Z87891 Personal history of nicotine dependence: Secondary | ICD-10-CM | POA: Diagnosis not present

## 2021-04-20 DIAGNOSIS — Z79899 Other long term (current) drug therapy: Secondary | ICD-10-CM | POA: Insufficient documentation

## 2021-04-20 DIAGNOSIS — Z85528 Personal history of other malignant neoplasm of kidney: Secondary | ICD-10-CM | POA: Diagnosis not present

## 2021-04-20 DIAGNOSIS — J45909 Unspecified asthma, uncomplicated: Secondary | ICD-10-CM | POA: Insufficient documentation

## 2021-04-20 DIAGNOSIS — E039 Hypothyroidism, unspecified: Secondary | ICD-10-CM | POA: Diagnosis not present

## 2021-04-20 DIAGNOSIS — I10 Essential (primary) hypertension: Secondary | ICD-10-CM | POA: Insufficient documentation

## 2021-04-20 DIAGNOSIS — J449 Chronic obstructive pulmonary disease, unspecified: Secondary | ICD-10-CM | POA: Insufficient documentation

## 2021-04-20 DIAGNOSIS — L089 Local infection of the skin and subcutaneous tissue, unspecified: Secondary | ICD-10-CM | POA: Diagnosis not present

## 2021-04-20 DIAGNOSIS — R220 Localized swelling, mass and lump, head: Secondary | ICD-10-CM | POA: Diagnosis present

## 2021-04-20 DIAGNOSIS — Z7952 Long term (current) use of systemic steroids: Secondary | ICD-10-CM | POA: Diagnosis not present

## 2021-04-20 DIAGNOSIS — Z20822 Contact with and (suspected) exposure to covid-19: Secondary | ICD-10-CM | POA: Diagnosis not present

## 2021-04-20 LAB — RESP PANEL BY RT-PCR (FLU A&B, COVID) ARPGX2
Influenza A by PCR: NEGATIVE
Influenza B by PCR: NEGATIVE
SARS Coronavirus 2 by RT PCR: NEGATIVE

## 2021-04-20 MED ORDER — DOXYCYCLINE HYCLATE 100 MG PO CAPS: 100.0000 mg | ORAL_CAPSULE | Freq: Two times a day (BID) | ORAL | 0 refills | Status: AC

## 2021-04-20 NOTE — ED Provider Notes (Signed)
[facility] DEPT Provider Note   CSN: [id number] Arrival date & time: 04/20/21  1117     History Chief Complaint  Patient presents with   Facial Swelling    ANGELEEN HORNEY is a 75 y.o. female.  HPI  75 year old female with a history of allergies, anxiety, asthma, basal cell carcinoma, kidney cancer, cervical dysplasia, COPD, depression, hyperlipidemia, GERD, glaucoma, hypertension, lymphocytic colitis, osteoporosis, squamous cell carcinoma, who presents to the emergency department today for evaluation of facial swelling and headache.  Patient states that she has had some pain and swelling within the nose for the last several days.  She was seen at urgent care yesterday where they did a COVID test which was positive.  She states that she was also given an antibiotic ointment which she has been using however this is not improved her symptoms and she feels like the swelling inside her nose is worse.  She has had no fevers or pain to the sinuses.  She further reports that she has had intermittent headaches for the last several days that are frontal in nature.  She has had no associated visual changes, eye pain, unilateral numbness/weakness, difficulty speaking or difficulty with balance.  She has had headaches in the past but this feels different.  She does not think that her symptoms are related to COVID and wants to be retested.  Past Medical History:  Diagnosis Date   Allergy    Anxiety    Asthma    BCC (basal cell carcinoma), leg, left 08/2017   Cancer ([place])    Kidney cancer-Hypernephroma   Cervical dysplasia    COPD (chronic obstructive pulmonary disease) (HCC)    Depression    Elevated cholesterol    GERD (gastroesophageal reflux disease)    Glaucoma    Hypertension    Lymphocytic colitis    Osteoporosis 04/2018   2016 T score -3.2 2019 T score -2.8   SCC (squamous cell carcinoma), leg, right 08/2017   Serum calcium elevated     Patient  Active Problem List   Diagnosis Date Noted   GAD (generalized anxiety disorder) 06/18/2017   Chronic left-sided low back pain with left-sided sciatica 06/18/2017   Lymphocytic colitis 06/18/2017   Single kidney 05/05/2017   Depression 05/05/2017   Eosinophilia 11/27/2016   H/O sleep apnea 11/27/2016   Normocalcemic Hyperparathyroidism ([place]) 11/21/2016   Osteoporosis 11/21/2016   Allergic rhinitis 03/17/2016   Multiple nasal polyps 03/17/2016   Chronic obstructive airway disease with asthma ([place]) 12/24/2015   Vitamin D Deficiency 02/14/2014   Other abnormal glucose 02/14/2014   Encounter for long-term (current) use of other medications 12/13/2013   Hypertension    Hyperlipidemia    Cervical dysplasia    DUB (dysfunctional uterine bleeding)    Hypothyroidism 11/30/2007    Past Surgical History:  Procedure Laterality Date   ABDOMINAL HYSTERECTOMY  1989   TAH LSO   AUGMENTATION MAMMAPLASTY     Implants removed   COLPOSCOPY     COSMETIC SURGERY     EYE SURGERY  2017   KIDNEY REMOVED  2002   Left   NOSE SURGERY  2017   OOPHORECTOMY     LSO   TONSILLECTOMY       OB History     Gravida  1   Para  1   Term  1   Preterm      AB      Living  1      SAB  IAB      Ectopic      Multiple      Live Births              Family History  Problem Relation Age of Onset   Hypertension Mother    Heart disease Mother    Asthma Mother    Mental illness Mother        depression and anxiety   Lung cancer Father    Cancer Father        lung   Hypertension Sister    Cancer Sister        Stem cell cancer   Hypertension Brother    Stroke Brother    Cancer Brother        prostate cancer   Ovarian cancer Maternal Aunt    Breast cancer Maternal Aunt        Age 53's   Cancer Maternal Uncle        Prostate cancer   Breast cancer Maternal Aunt        Age 84's   Hypertension Daughter    Arthritis Daughter     Social History   Tobacco Use   Smoking  status: Former    Packs/day: 5.00    Years: 10.00    Pack years: 50.00    Types: Cigarettes    Quit date: 08/23/1983    Years since quitting: 37.6   Smokeless tobacco: Never  Vaping Use   Vaping Use: Never used  Substance Use Topics   Alcohol use: Yes    Alcohol/week: 21.0 standard drinks    Types: 21 Standard drinks or equivalent per week    Comment: social   Drug use: No    Comment: cbd hemp    Home Medications Prior to Admission medications   Medication Sig Start Date End Date Taking? Authorizing Provider  doxycycline (VIBRAMYCIN) 100 MG capsule Take 1 capsule (100 mg total) by mouth 2 (two) times daily for 7 days. 04/20/21 04/27/21 Yes Reem Fleury S, PA-C  ALPRAZolam (XANAX) 0.5 MG tablet Take 1-2 tablets (0.5-1 mg total) by mouth at bedtime as needed for sleep. 02/23/18   Harrison Mons, PA  amLODipine (NORVASC) 5 MG tablet Take 0.5-1 tablets (2.5-5 mg total) by mouth daily. 02/23/18   Harrison Mons, PA  brimonidine (ALPHAGAN) 0.2 % ophthalmic solution  10/19/16   [provider]  cetirizine (ZYRTEC) 10 MG tablet Take 10 mg by mouth as needed for allergies.    [provider]  Cholecalciferol 100 MCG (4000 UT) CAPS Take 1 capsule by mouth daily.    [provider]  co-enzyme Q-10 50 MG capsule Take 50 mg by mouth daily.    [provider]  fluticasone furoate-vilanterol (BREO ELLIPTA) 200-25 MCG/INH AEPB Inhale 1 puff into the lungs daily. 09/15/18   Noralee Space, MD  fluticasone furoate-vilanterol (BREO ELLIPTA) 200-25 MCG/INH AEPB Inhale 1 puff into the lungs daily. 08/02/20   Freddi Starr, MD  levothyroxine (SYNTHROID, LEVOTHROID) 100 MCG tablet TAKE 1 TABLET EVERY DAY 01/04/18   Harrison Mons, PA  losartan (COZAAR) 50 MG tablet Take 50 mg by mouth daily. 06/17/17   [provider]  pantoprazole (PROTONIX) 40 MG tablet Take 40 mg by mouth daily.    [provider]  Probiotic Product (PROBIOTIC PO) Take 1 tablet by  mouth at bedtime.     [provider]  RHOPRESSA 0.02 % SOLN  12/24/17   [provider]  rosuvastatin (CRESTOR) 10  MG tablet Take 10 mg by mouth daily. 06/17/17   [provider]  triamcinolone cream (KENALOG) 0.1 % APPLY TO AFFECTED AREA UP TO TWICE DAILY AS NEEDED FOR ECZEMA (NOT TO FACE, GROIN, [place]) 01/14/18   [provider]  venlafaxine XR (EFFEXOR-XR) 150 MG 24 hr capsule Take 1 capsule (150 mg total) daily with breakfast by mouth. 08/28/17   Harrison Mons, PA  Zoledronic Acid (RECLAST IV) Inject into the vein. Patient not taking: No sig reported    [provider]    Allergies    Mirtazapine, Iodine, Iohexol, Penicillins, Sulfa antibiotics, and Neosporin [neomycin-polymyxin-gramicidin]  Review of Systems   Review of Systems  Constitutional:  Negative for fever.  HENT:  Positive for congestion and facial swelling.        Nose pain  Respiratory:  Negative for cough.   Cardiovascular:  Negative for chest pain.  Gastrointestinal:  Negative for abdominal pain, nausea and vomiting.  Genitourinary:  Negative for dysuria.  Musculoskeletal:  Negative for back pain and neck pain.  Skin:  Negative for wound.  Neurological:  Positive for headaches. Negative for dizziness, speech difficulty, weakness, light-headedness and numbness.   Physical Exam Updated Vital Signs BP (!) 146/82 (BP Location: Right Arm)   Pulse 90   Temp 98.6 F (37 C) (Oral)   Resp 16   SpO2 98%   Physical Exam Vitals and nursing note reviewed.  Constitutional:      General: She is not in acute distress.    Appearance: She is well-developed.  HENT:     Head: Normocephalic and atraumatic.     Nose:     Comments: Swelling and edema noted within the nose which is ttp. There is no discernable abscess present. No ttp over the maxillary sinus and no significant periocular edema/erythema Eyes:     Extraocular Movements: Extraocular movements intact.      Conjunctiva/sclera: Conjunctivae normal.     Pupils: Pupils are equal, round, and reactive to light.     Comments: No pain with eom  Cardiovascular:     Rate and Rhythm: Normal rate and regular rhythm.     Heart sounds: No murmur heard. Pulmonary:     Effort: Pulmonary effort is normal. No respiratory distress.     Breath sounds: Normal breath sounds.  Abdominal:     Palpations: Abdomen is soft.     Tenderness: There is no abdominal tenderness.  Musculoskeletal:     Cervical back: Neck supple.  Skin:    General: Skin is warm and dry.  Neurological:     Mental Status: She is alert.     Comments: Mental Status:  Alert, thought content appropriate, able to give a coherent history. Speech fluent without evidence of aphasia. Able to follow 2 step commands without difficulty.  Cranial Nerves:  II:  Peripheral visual fields grossly normal, pupils equal, round, reactive to light III,IV, VI: ptosis not present, extra-ocular motions intact bilaterally  V,VII: smile symmetric, facial light touch sensation equal VIII: hearing grossly normal to voice  X: uvula elevates symmetrically  XI: bilateral shoulder shrug symmetric and strong XII: midline tongue extension without fassiculations Motor:  Normal tone. 5/5 strength of BUE and BLE major muscle groups including strong and equal grip strength and dorsiflexion/plantar flexion Sensory: light touch normal in all extremities.     ED Results / Procedures / Treatments   Labs (all labs ordered are listed, but only abnormal results are displayed) Labs Reviewed  RESP PANEL BY RT-PCR (  FLU A&B, COVID) [id number]    EKG None  Radiology No results found.  Procedures Procedures   Medications Ordered in ED Medications - No data to display  ED Course  I have reviewed the triage vital signs and the nursing notes.  Pertinent labs & imaging results that were available during my care of the patient were reviewed by me and considered in my medical  decision making (see chart for details).    MDM Rules/Calculators/A&P                          75 year old female here with complaints of pain inside the nose.  Recently started on antibacterial ointment without relief of symptoms.  Appears to have early cellulitis within the nose there is no discernible abscess that I can drain here in the ED.  She will be started on oral antibiotics for this and advised to continue her antibiotic ointment.  There is not appear to be any ocular involvement.  Patient concerned about her eye pressures and due to her history of glaucoma and intraocular pressures were checked which were less than 20 bilaterally, 17 mmHg on the right and 19 mmHg on the left.  Additionally, patient complaining of headache.  Diagnosed with COVID earlier this week but concerned that test was inaccurate so requesting retest.  Patient retested in the ED and results pending at the time of discharge.  We will follow-up with patient if results are positive.  She has normal neurologic exam and I have low suspicion for any emergent neurologic process causing her symptoms at this time, suspect symptoms related to COVID infection.  Advised supportive care.  Will give ENT follow-up in regards to her nasal cellulitis and advised to return to ED for new or worsening symptoms.  She voiced understanding the plan and reasons to return.  All questions answered.  Patient stable for discharge.  Final Clinical Impression(s) / ED Diagnoses Final diagnoses:  Skin infection  Suspected COVID-19 virus infection    Rx / DC Orders ED Discharge Orders          Ordered    doxycycline (VIBRAMYCIN) 100 MG capsule  2 times daily        04/20/21 [address], Shyheem Whitham S, PA-C 04/20/21 1344    Charlesetta Shanks, MD 05/09/21 [phone]

## 2021-04-20 NOTE — ED Triage Notes (Signed)
Pt complains of lesion in left nostril. She was seen 2 days ago and given ointment to apply. Her left face has become more swollen since and she has headache, she is concerned for infection. She also tested positive for COVID 2 days ago. She doesn't trust the results.

## 2021-04-20 NOTE — Discharge Instructions (Addendum)
You were given a prescription for antibiotics. Please take the antibiotic prescription fully.   COVID test is pending at the time you are discharged.  If it is positive I will contact you with the results however if it is negative you will not be contacted.  You can also check the result on your MyChart and it should be available within the next few hours after being discharged.  Please follow up with the ear, nose and throat doctor, within 5-7 days for re-evaluation of your symptoms. If you do not have a primary care provider, information for a healthcare clinic has been provided for you to make arrangements for follow up care. Please return to the emergency department for any new or worsening symptoms.

## 2021-04-30 ENCOUNTER — Other Ambulatory Visit: Payer: Self-pay | Admitting: Nurse Practitioner

## 2021-04-30 ENCOUNTER — Ambulatory Visit (INDEPENDENT_AMBULATORY_CARE_PROVIDER_SITE_OTHER): Payer: Medicare Other

## 2021-04-30 ENCOUNTER — Other Ambulatory Visit: Payer: Self-pay

## 2021-04-30 DIAGNOSIS — M8589 Other specified disorders of bone density and structure, multiple sites: Secondary | ICD-10-CM | POA: Diagnosis not present

## 2021-04-30 DIAGNOSIS — Z780 Asymptomatic menopausal state: Secondary | ICD-10-CM | POA: Diagnosis not present

## 2021-04-30 DIAGNOSIS — M810 Age-related osteoporosis without current pathological fracture: Secondary | ICD-10-CM

## 2021-09-09 ENCOUNTER — Other Ambulatory Visit: Payer: Self-pay | Admitting: Internal Medicine

## 2021-09-09 DIAGNOSIS — Z1231 Encounter for screening mammogram for malignant neoplasm of breast: Secondary | ICD-10-CM

## 2021-09-18 ENCOUNTER — Ambulatory Visit: Payer: Medicare Other

## 2021-10-15 ENCOUNTER — Ambulatory Visit: Payer: Medicare Other

## 2021-10-22 ENCOUNTER — Ambulatory Visit
Admission: RE | Admit: 2021-10-22 | Discharge: 2021-10-22 | Disposition: A | Discharge status: Home / Self Care | Payer: Medicare Other | Source: Ambulatory Visit | Attending: Internal Medicine | Admitting: Internal Medicine

## 2021-10-22 DIAGNOSIS — Z1231 Encounter for screening mammogram for malignant neoplasm of breast: Secondary | ICD-10-CM

## 2022-01-02 ENCOUNTER — Other Ambulatory Visit: Payer: Self-pay

## 2022-01-02 ENCOUNTER — Encounter (HOSPITAL_BASED_OUTPATIENT_CLINIC_OR_DEPARTMENT_OTHER): Payer: Self-pay | Admitting: Obstetrics and Gynecology

## 2022-01-02 ENCOUNTER — Emergency Department (HOSPITAL_BASED_OUTPATIENT_CLINIC_OR_DEPARTMENT_OTHER)
Admission: EM | Admit: 2022-01-02 | Discharge: 2022-01-02 | Disposition: A | Discharge status: Home / Self Care | Payer: Medicare Other | Attending: Emergency Medicine | Admitting: Emergency Medicine

## 2022-01-02 DIAGNOSIS — Z85828 Personal history of other malignant neoplasm of skin: Secondary | ICD-10-CM | POA: Diagnosis not present

## 2022-01-02 DIAGNOSIS — J449 Chronic obstructive pulmonary disease, unspecified: Secondary | ICD-10-CM | POA: Insufficient documentation

## 2022-01-02 DIAGNOSIS — I10 Essential (primary) hypertension: Secondary | ICD-10-CM | POA: Insufficient documentation

## 2022-01-02 DIAGNOSIS — R519 Headache, unspecified: Secondary | ICD-10-CM | POA: Diagnosis present

## 2022-01-02 DIAGNOSIS — J3489 Other specified disorders of nose and nasal sinuses: Secondary | ICD-10-CM

## 2022-01-02 DIAGNOSIS — Z7951 Long term (current) use of inhaled steroids: Secondary | ICD-10-CM | POA: Diagnosis not present

## 2022-01-02 DIAGNOSIS — R0981 Nasal congestion: Secondary | ICD-10-CM | POA: Diagnosis not present

## 2022-01-02 DIAGNOSIS — Z79899 Other long term (current) drug therapy: Secondary | ICD-10-CM | POA: Insufficient documentation

## 2022-01-02 HISTORY — DX: Abscess, furuncle and carbuncle of nose: J34.0

## 2022-01-02 MED ORDER — DEXAMETHASONE SODIUM PHOSPHATE 10 MG/ML IJ SOLN
10.0000 mg | Freq: Once | INTRAMUSCULAR | Status: AC
  Administered 2022-01-02: 10 mg via INTRAMUSCULAR
  Filled 2022-01-02: qty 1

## 2022-01-02 MED ORDER — DOXYCYCLINE HYCLATE 100 MG PO CAPS: 100.0000 mg | ORAL_CAPSULE | Freq: Two times a day (BID) | ORAL | 0 refills | Status: AC

## 2022-01-02 MED ORDER — DOXYCYCLINE HYCLATE 100 MG PO TABS
100.0000 mg | ORAL_TABLET | Freq: Once | ORAL | Status: AC
  Administered 2022-01-02: 100 mg via ORAL
  Filled 2022-01-02: qty 1

## 2022-01-02 NOTE — ED Notes (Signed)
MD at the Bedside. 

## 2022-01-02 NOTE — ED Triage Notes (Signed)
Patient reports to the ER for facial pain. Patient reports she has a hx of nasal cellulitis and she is sure she has it again. Patient reports the pain in her face has been worsening x2 weeks.  ?

## 2022-01-02 NOTE — Discharge Instructions (Signed)
You came to the emergency department today to be evaluated for your nasal congestion, sinus pain/pressure, and headache.  You were given steroids in the emergency department and started on the medication doxycycline.  Please follow-up with your ear nose and throat doctor in the outpatient setting. ? ?Some common side effects of steroids include feelings of extra energy, feeling warm, increased appetite, and stomach upset.  If you are diabetic your sugars may run higher than usual.  ? ?You may have diarrhea from the antibiotics.  It is very important that you continue to take the antibiotics even if you get diarrhea unless a medical professional tells you that you may stop taking them.  If you stop too early the bacteria you are being treated for will become stronger and you may need different, more powerful antibiotics that have more side effects and worsening diarrhea.  Please stay well hydrated and consider probiotics as they may decrease the severity of your diarrhea.  ? ?et help right away if: ?Your headache: ?Becomes severe quickly. ?Gets worse after moderate to intense physical activity. ?You have any of these symptoms: ?Repeated vomiting. ?Pain or stiffness in your neck. ?Changes to your vision. ?Pain in an eye or ear. ?Problems with speech. ?Muscular weakness or loss of muscle control. ?Loss of balance or coordination. ?You feel faint or pass out. ?You have confusion. ?You have a seizure. ? ?

## 2022-01-02 NOTE — ED Provider Notes (Signed)
?[name] ?Provider Note ? ? ?CSN: [id number] ?Arrival date & time: 01/02/22  1557 ? ?  ? ?History ? ?Chief Complaint  ?Patient presents with  ? Facial Pain  ? ? ?[name] [name] is a 76 y.o. female with a past medical history of sinonasal polyp disease. , nasal vestibulitis, COPD, hypertension, squamous cell carcinoma right leg, basal cell carcinoma left leg.   ? ?Presents to the emergency department with a complaint of nasal congestion, sinus pain/pressure, and headaches.  Patient reports that symptoms have been present over the last week and a half.  Patient states that "feels like water is in my nose."  Patient has noted crusting around her left nare.  Reports seeing "pustules," within the left nare.  Patient states that nasal congestion is worse in the morning.  States that "I feel entire trash can with tissues from blowing my nose."  She endorses sinus pain and pressure.  States that she has been having intermittent headaches over the last week and a half.  Pain is located throughout her entire head.  Pain onset is gradual pain progressively worse over time.  Patient takes Tylenol with some improvement in her pain.  Patient reports that she has follow-up with ENT provider next Thursday. ? ?Patient denies any fever, chills, neck pain, neck stiffness, sore throat, visual disturbance, numbness, weakness, facial asymmetry, dysarthria, lightheadedness, syncope. ? ?Per chart review patient supposed be taking topical steroid therapy with budesonide irrigations twice daily per Dr. Janeal Holmes with ENT. ? ?HPI ? ?  ? ?Home Medications ?Prior to Admission medications   ?Medication Sig Start Date End Date Taking? Authorizing Provider  ?ALPRAZolam (XANAX) 0.5 MG tablet Take 1-2 tablets (0.5-1 mg total) by mouth at bedtime as needed for sleep. 02/23/18   Harrison Mons, PA  ?amLODipine (NORVASC) 5 MG tablet Take 0.5-1 tablets (2.5-5 mg total) by mouth daily. 02/23/18   Harrison Mons, PA   ?brimonidine (ALPHAGAN) 0.2 % ophthalmic solution  10/19/16   [provider]  ?cetirizine (ZYRTEC) 10 MG tablet Take 10 mg by mouth as needed for allergies.    [provider]  ?Cholecalciferol 100 MCG (4000 UT) CAPS Take 1 capsule by mouth daily.    [provider]  ?co-enzyme Q-10 50 MG capsule Take 50 mg by mouth daily.    [provider]  ?fluticasone furoate-vilanterol (BREO ELLIPTA) 200-25 MCG/INH AEPB Inhale 1 puff into the lungs daily. 09/15/18   Noralee Space, MD  ?fluticasone furoate-vilanterol (BREO ELLIPTA) 200-25 MCG/INH AEPB Inhale 1 puff into the lungs daily. 08/02/20   Freddi Starr, MD  ?levothyroxine (SYNTHROID, LEVOTHROID) 100 MCG tablet TAKE 1 TABLET EVERY DAY 01/04/18   Harrison Mons, PA  ?losartan (COZAAR) 50 MG tablet Take 50 mg by mouth daily. 06/17/17   [provider]  ?pantoprazole (PROTONIX) 40 MG tablet Take 40 mg by mouth daily.    [provider]  ?Probiotic Product (PROBIOTIC PO) Take 1 tablet by mouth at bedtime.     [provider]  ?RHOPRESSA 0.02 % SOLN  12/24/17   [provider]  ?rosuvastatin (CRESTOR) 10 MG tablet Take 10 mg by mouth daily. 06/17/17   [provider]  ?triamcinolone cream (KENALOG) 0.1 % APPLY TO AFFECTED AREA UP TO TWICE DAILY AS NEEDED FOR ECZEMA (NOT TO FACE, [place], [place]) 01/14/18   [provider]  ?venlafaxine XR (EFFEXOR-XR) 150 MG 24 hr capsule Take 1 capsule (150 mg total) daily with breakfast by mouth. 08/28/17  Harrison Mons, PA  ?Zoledronic Acid (RECLAST IV) Inject into the vein. ?Patient not taking: No sig reported    [provider]  ?   ? ?Allergies    ?Mirtazapine, Iodine, Iohexol, Penicillins, Sulfa antibiotics, and Neosporin [neomycin-polymyxin-gramicidin]   ? ?Review of Systems   ?Review of Systems  ?Constitutional:  Negative for chills and fever.  ?HENT:  Positive for congestion, rhinorrhea, sinus pressure and sinus pain. Negative for  facial swelling, sore throat, trouble swallowing and voice change.   ?Eyes:  Negative for visual disturbance.  ?Respiratory:  Negative for cough and shortness of breath.   ?Cardiovascular:  Negative for chest pain.  ?Gastrointestinal:  Negative for vomiting.  ?Musculoskeletal:  Negative for neck pain and neck stiffness.  ?Neurological:  Negative for dizziness, tremors, seizures, syncope, facial asymmetry, speech difficulty, weakness, light-headedness, numbness and headaches.  ? ?Physical Exam ?Updated Vital Signs ?BP (!) 157/87   Pulse 81   Temp 98.9 ?F (37.2 ?C)   Resp 18   SpO2 95%  ?Physical Exam ?Vitals and nursing note reviewed.  ?Constitutional:   ?   General: She is not in acute distress. ?   Appearance: She is not ill-appearing, toxic-appearing or diaphoretic.  ?HENT:  ?   Head: Normocephalic.  ?   Nose:  ?   Right Sinus: Maxillary sinus tenderness present. No frontal sinus tenderness.  ?   Left Sinus: Maxillary sinus tenderness present. No frontal sinus tenderness.  ?Eyes:  ?   General: No scleral icterus.    ?   Right eye: No discharge.     ?   Left eye: No discharge.  ?   Extraocular Movements: Extraocular movements intact.  ?   Conjunctiva/sclera: Conjunctivae normal.  ?   Right eye: Right conjunctiva is not injected. No chemosis, exudate or hemorrhage. ?   Left eye: Left conjunctiva is not injected. No chemosis, exudate or hemorrhage. ?   Pupils: Pupils are equal, round, and reactive to light.  ?Cardiovascular:  ?   Rate and Rhythm: Normal rate.  ?Pulmonary:  ?   Effort: Pulmonary effort is normal.  ?Musculoskeletal:  ?   Cervical back: Normal range of motion and neck supple. No edema, erythema, signs of trauma, rigidity, torticollis or crepitus. No pain with movement. Normal range of motion.  ?Skin: ?   General: Skin is warm and dry.  ?Neurological:  ?   General: No focal deficit present.  ?   Mental Status: She is alert.  ?   Cranial Nerves: Cranial nerves 2-12 are intact. No cranial nerve  deficit, dysarthria or facial asymmetry.  ?   Sensory: Sensation is intact.  ?   Motor: No weakness, tremor, seizure activity or pronator drift.  ?   Coordination: Finger-Nose-Finger Test normal.  ?   Comments: Grip strength equal.  +5 strength to bilateral upper and lower extremities.    ?Psychiatric:     ?   Behavior: Behavior is cooperative.  ? ? ?ED Results / Procedures / Treatments   ?Labs ?(all labs ordered are listed, but only abnormal results are displayed) ?Labs Reviewed - No data to display ? ?EKG ?None ? ?Radiology ?No results found. ? ?Procedures ?Procedures  ? ? ?Medications Ordered in ED ?Medications - No data to display ? ?ED Course/ Medical Decision Making/ A&P ?  ?                        ?Medical Decision Making ?Risk ?Prescription drug management. ? ? ?  Alert 76 year old female no acute stress, nontoxic-appearing.  Presents emergency department for complaint of nasal congestion, sinus pain/pressure, and headaches. ? ?Information obtained from patient patient's husband at bedside.  Past medical record reviewed including previous provider notes and labs.  Patient has past medical history as outlined in HPI which complicates her care. ? ?Patient reports that headache onset is gradual pain progressively worse over time.  No sudden onset of pain or worsening with patient.  Suspicion for subarachnoid hemorrhage at this time.  Denies any recent trauma, suspicion for intracranial hemorrhage.   ? ?Patient given steroid injection while in the emergency department.  We will start her on 7 day course of doxycycline.  Patient to follow-up with ENT in outpatient setting. ? ?Patient was discussed with and evaluated by Dr. Langston Masker ? ?Discussed results, findings, treatment and follow up. Patient advised of return precautions. Patient verbalized understanding and agreed with plan. ? ? ? ? ? ? ? ? ? ?Final Clinical Impression(s) / ED Diagnoses ?Final diagnoses:  ?None  ? ? ?Rx / DC Orders ?ED Discharge Orders   ? ?  None  ? ?  ? ? ?  ?Loni Beckwith, PA-C ?01/02/22 1823 ? ?  ?Wyvonnia Dusky, MD ?01/03/22 [phone] ? ?

## 2022-01-09 ENCOUNTER — Other Ambulatory Visit: Payer: Self-pay | Admitting: Internal Medicine

## 2022-01-09 DIAGNOSIS — G4486 Cervicogenic headache: Secondary | ICD-10-CM

## 2022-01-09 DIAGNOSIS — M5030 Other cervical disc degeneration, unspecified cervical region: Secondary | ICD-10-CM

## 2022-01-09 DIAGNOSIS — M5412 Radiculopathy, cervical region: Secondary | ICD-10-CM

## 2022-01-10 ENCOUNTER — Ambulatory Visit
Admission: RE | Admit: 2022-01-10 | Discharge: 2022-01-10 | Disposition: A | Discharge status: Home / Self Care | Payer: Medicare Other | Source: Ambulatory Visit | Attending: Internal Medicine | Admitting: Internal Medicine

## 2022-01-10 DIAGNOSIS — G4486 Cervicogenic headache: Secondary | ICD-10-CM

## 2022-01-10 DIAGNOSIS — M5412 Radiculopathy, cervical region: Secondary | ICD-10-CM

## 2022-01-10 DIAGNOSIS — M5030 Other cervical disc degeneration, unspecified cervical region: Secondary | ICD-10-CM

## 2022-01-10 MED ORDER — GADOBENATE DIMEGLUMINE 529 MG/ML IV SOLN
15.0000 mL | Freq: Once | INTRAVENOUS | Status: AC | PRN
  Administered 2022-01-10: 15 mL via INTRAVENOUS

## 2022-08-08 ENCOUNTER — Other Ambulatory Visit: Payer: Self-pay | Admitting: Gastroenterology

## 2022-08-08 DIAGNOSIS — R1310 Dysphagia, unspecified: Secondary | ICD-10-CM

## 2022-08-13 ENCOUNTER — Ambulatory Visit
Admission: RE | Admit: 2022-08-13 | Discharge: 2022-08-13 | Disposition: A | Discharge status: Home / Self Care | Payer: Medicare Other | Source: Ambulatory Visit | Attending: Gastroenterology | Admitting: Gastroenterology

## 2022-08-13 DIAGNOSIS — R1310 Dysphagia, unspecified: Secondary | ICD-10-CM

## 2022-11-09 ENCOUNTER — Ambulatory Visit
Admission: EM | Admit: 2022-11-09 | Discharge: 2022-11-09 | Disposition: A | Discharge status: Home / Self Care | Payer: Medicare Other

## 2022-11-09 DIAGNOSIS — J441 Chronic obstructive pulmonary disease with (acute) exacerbation: Secondary | ICD-10-CM | POA: Diagnosis not present

## 2022-11-09 MED ORDER — AZITHROMYCIN 250 MG PO TABS: 250.0000 mg | ORAL_TABLET | Freq: Every day | ORAL | 0 refills | Status: DC

## 2022-11-09 MED ORDER — PREDNISONE 10 MG PO TABS: 40.0000 mg | ORAL_TABLET | Freq: Every day | ORAL | 0 refills | Status: AC

## 2022-11-09 NOTE — ED Provider Notes (Signed)
[name]    CSN: [id number] Arrival date & time: 11/09/22  1326      History   Chief Complaint Chief Complaint  Patient presents with   Cough    HPI [name] [name] is a 77 y.o. female.  Patient presents with 2 day history of congestion, cough with increased sputum production, chest tightness with cough, shortness of breath with cough.  Treatment with Breo inhaler and Tylenol.  She has not used her albuterol inhaler since yesterday.  She denies fever, ear pain, sore throat, vomiting, diarrhea, or other symptoms.  Her medical history includes COPD, kidney cancer, single kidney, hypertension.   The history is provided by the patient and medical records.    Past Medical History:  Diagnosis Date   Allergy    Anxiety    Asthma    BCC (basal cell carcinoma), leg, left 08/2017   Cancer ([place])    Kidney cancer-Hypernephroma   Cellulitis of nasal    Cervical dysplasia    COPD (chronic obstructive pulmonary disease) (HCC)    Depression    Elevated cholesterol    GERD (gastroesophageal reflux disease)    Glaucoma    Hypertension    Lymphocytic colitis    Osteoporosis 04/2018   2016 T score -3.2 2019 T score -2.8   SCC (squamous cell carcinoma), leg, right 08/2017   Serum calcium elevated     Patient Active Problem List   Diagnosis Date Noted   GAD (generalized anxiety disorder) 06/18/2017   Chronic left-sided low back pain with left-sided sciatica 06/18/2017   Lymphocytic colitis 06/18/2017   Single kidney 05/05/2017   Depression 05/05/2017   Eosinophilia 11/27/2016   H/O sleep apnea 11/27/2016   Normocalcemic Hyperparathyroidism ([place]) 11/21/2016   Osteoporosis 11/21/2016   Allergic rhinitis 03/17/2016   Multiple nasal polyps 03/17/2016   Chronic obstructive airway disease with asthma 12/24/2015   Vitamin D Deficiency 02/14/2014   Other abnormal glucose 02/14/2014   Encounter for long-term (current) use of other medications 12/13/2013   Hypertension     Hyperlipidemia    Cervical dysplasia    DUB (dysfunctional uterine bleeding)    Hypothyroidism 11/30/2007    Past Surgical History:  Procedure Laterality Date   ABDOMINAL HYSTERECTOMY  1989   TAH LSO   AUGMENTATION MAMMAPLASTY     Implants removed   COLPOSCOPY     COSMETIC SURGERY     EYE SURGERY  2017   KIDNEY REMOVED  2002   Left   NOSE SURGERY  2017   OOPHORECTOMY     LSO   TONSILLECTOMY      OB History     Gravida  1   Para  1   Term  1   Preterm      AB      Living  1      SAB      IAB      Ectopic      Multiple      Live Births               Home Medications    Prior to Admission medications   Medication Sig Start Date End Date Taking? Authorizing Provider  azithromycin (ZITHROMAX) 250 MG tablet Take 1 tablet (250 mg total) by mouth daily. Take first 2 tablets together, then 1 every day until finished. 11/09/22  Yes Sharion Balloon, NP  budesonide (PULMICORT) 0.5 MG/2ML nebulizer solution RINSE SINUSES WITH HALF OF SINUS RINSE BOTTLE THEN ADD  ONE RESPULE AND MIX AND IRRIGATE SINUSES WITH REMAINING MIXTURE. PERFORM TWICE DAILY. 10/10/21  Yes [provider]  predniSONE (DELTASONE) 10 MG tablet Take 4 tablets (40 mg total) by mouth daily for 5 days. 11/09/22 11/14/22 Yes Sharion Balloon, NP  albuterol (ACCUNEB) 0.63 MG/3ML nebulizer solution albuterol sulfate 0.63 mg/3 mL solution for nebulization  INHALE BY INHALATION ROUTE DAILY    [provider]  albuterol (VENTOLIN HFA) 108 (90 Base) MCG/ACT inhaler Inhale into the lungs.    [provider]  ALPRAZolam Duanne Moron) 0.5 MG tablet Take 1-2 tablets (0.5-1 mg total) by mouth at bedtime as needed for sleep. 02/23/18   Harrison Mons, PA  amLODipine (NORVASC) 5 MG tablet Take 0.5-1 tablets (2.5-5 mg total) by mouth daily. 02/23/18   Harrison Mons, PA  brimonidine (ALPHAGAN) 0.2 % ophthalmic solution  10/19/16   [provider]  cetirizine (ZYRTEC) 10 MG tablet Take 10 mg  by mouth as needed for allergies.    [provider]  Cholecalciferol 100 MCG (4000 UT) CAPS Take 1 capsule by mouth daily.    [provider]  co-enzyme Q-10 50 MG capsule Take 50 mg by mouth daily.    [provider]  fluticasone furoate-vilanterol (BREO ELLIPTA) 200-25 MCG/INH AEPB Inhale 1 puff into the lungs daily. 09/15/18   Noralee Space, MD  fluticasone furoate-vilanterol (BREO ELLIPTA) 200-25 MCG/INH AEPB Inhale 1 puff into the lungs daily. 08/02/20   Freddi Starr, MD  levothyroxine (SYNTHROID, LEVOTHROID) 100 MCG tablet TAKE 1 TABLET EVERY DAY 01/04/18   Harrison Mons, PA  losartan (COZAAR) 50 MG tablet Take 50 mg by mouth daily. 06/17/17   [provider]  pantoprazole (PROTONIX) 40 MG tablet Take 40 mg by mouth daily.    [provider]  Probiotic Product (PROBIOTIC PO) Take 1 tablet by mouth at bedtime.     [provider]  RHOPRESSA 0.02 % SOLN  12/24/17   [provider]  rosuvastatin (CRESTOR) 10 MG tablet Take 10 mg by mouth daily. 06/17/17   [provider]  triamcinolone cream (KENALOG) 0.1 % APPLY TO AFFECTED AREA UP TO TWICE DAILY AS NEEDED FOR ECZEMA (NOT TO FACE, [place], [place]) 01/14/18   [provider]  venlafaxine XR (EFFEXOR-XR) 150 MG 24 hr capsule Take 1 capsule (150 mg total) daily with breakfast by mouth. 08/28/17   Harrison Mons, PA  Zoledronic Acid (RECLAST IV) Inject into the vein. Patient not taking: No sig reported    [provider]    Family History Family History  Problem Relation Age of Onset   Hypertension Mother    Heart disease Mother    Asthma Mother    Mental illness Mother        depression and anxiety   Lung cancer Father    Cancer Father        lung   Hypertension Sister    Cancer Sister        Stem cell cancer   Hypertension Brother    Stroke Brother    Cancer Brother        prostate cancer   Ovarian cancer Maternal Aunt    Breast cancer  Maternal Aunt        Age 31's   Cancer Maternal Uncle        Prostate cancer   Breast cancer Maternal Aunt        Age 45's   Hypertension Daughter    Arthritis Daughter  Social History Social History   Tobacco Use   Smoking status: Former    Packs/day: 5.00    Years: 10.00    Total pack years: 50.00    Types: Cigarettes    Quit date: 08/23/1983    Years since quitting: 39.2   Smokeless tobacco: Never  Vaping Use   Vaping Use: Never used  Substance Use Topics   Alcohol use: Yes    Alcohol/week: 21.0 standard drinks of alcohol    Types: 21 Standard drinks or equivalent per week    Comment: social   Drug use: No    Comment: cbd hemp     Allergies   Mirtazapine, Iodine, Iohexol, Penicillins, Sulfa antibiotics, and Neosporin [neomycin-polymyxin-gramicidin]   Review of Systems Review of Systems  Constitutional:  Negative for chills and fever.  HENT:  Positive for congestion. Negative for ear pain and sore throat.   Respiratory:  Positive for cough, chest tightness, shortness of breath and wheezing.   Cardiovascular:  Negative for chest pain and palpitations.  Gastrointestinal:  Negative for diarrhea and vomiting.  Skin:  Negative for rash.  All other systems reviewed and are negative.    Physical Exam Triage Vital Signs ED Triage Vitals  Enc Vitals Group     BP      Pulse      Resp      Temp      Temp src      SpO2      Weight      Height      Head Circumference      Peak Flow      Pain Score      Pain Loc      Pain Edu?      Excl. in [place]?    No data found.  Updated Vital Signs BP 126/82   Pulse 70   Temp 98.3 F (36.8 C)   Resp 20   SpO2 96%   Visual Acuity Right Eye Distance:   Left Eye Distance:   Bilateral Distance:    Right Eye Near:   Left Eye Near:    Bilateral Near:     Physical Exam Vitals and nursing note reviewed.  Constitutional:      General: She is not in acute distress.    Appearance: She is well-developed. She is  not ill-appearing.  HENT:     Right Ear: Tympanic membrane normal.     Left Ear: Tympanic membrane normal.     Nose: Nose normal.     Mouth/Throat:     Mouth: Mucous membranes are moist.     Pharynx: Oropharynx is clear.  Cardiovascular:     Rate and Rhythm: Normal rate and regular rhythm.     Heart sounds: Normal heart sounds.  Pulmonary:     Effort: Pulmonary effort is normal. No respiratory distress.     Breath sounds: Wheezing present.     Comments: Few faint scattered wheezes.  Abdominal:     Palpations: Abdomen is soft.  Musculoskeletal:     Cervical back: Neck supple.  Skin:    General: Skin is warm and dry.  Neurological:     Mental Status: She is alert.  Psychiatric:        Mood and Affect: Mood normal.        Behavior: Behavior normal.      UC Treatments / Results  Labs (all labs ordered are listed, but only abnormal results are displayed) Labs Reviewed - No data  to display  EKG   Radiology No results found.  Procedures Procedures (including critical care time)  Medications Ordered in UC Medications - No data to display  Initial Impression / Assessment and Plan / UC Course  I have reviewed the triage vital signs and the nursing notes.  Pertinent labs & imaging results that were available during my care of the patient were reviewed by me and considered in my medical decision making (see chart for details).    COPD exacerbation.  O2 sat 96% on room air.  Patient reports increased sputum production.  Treating with prednisone, Zithromax, and continued use of albuterol inhaler.  Education provided on COPD exacerbation.  Instructed patient to follow up with her PCP.  She agrees to plan of care.    Final Clinical Impressions(s) / UC Diagnoses   Final diagnoses:  COPD exacerbation ([place])     Discharge Instructions      Take the prednisone and Zithromax as directed.  Use your albuterol inhaler as directed.  Follow up with your primary care provider.         ED Prescriptions     Medication Sig Dispense Auth. Provider   predniSONE (DELTASONE) 10 MG tablet Take 4 tablets (40 mg total) by mouth daily for 5 days. 20 tablet Sharion Balloon, NP   azithromycin (ZITHROMAX) 250 MG tablet Take 1 tablet (250 mg total) by mouth daily. Take first 2 tablets together, then 1 every day until finished. 6 tablet Sharion Balloon, NP      PDMP not reviewed this encounter.   Sharion Balloon, NP 11/09/22 1425

## 2022-11-09 NOTE — ED Triage Notes (Signed)
Patient to Urgent Care with complaints of productive cough/ discolored nasal congestion/ chest tightness when breathing/ hoarseness.   Expresses concerns about possible pneumonia/ bronchitis. Using breo inhaler/ tylenol.   Symptoms started Friday. Denies any known fevers.

## 2022-11-09 NOTE — Discharge Instructions (Addendum)
Take the prednisone and Zithromax as directed.  Use your albuterol inhaler as directed.  Follow up with your primary care provider.

## 2023-02-17 ENCOUNTER — Other Ambulatory Visit: Payer: Self-pay | Admitting: Internal Medicine

## 2023-02-17 DIAGNOSIS — Z1231 Encounter for screening mammogram for malignant neoplasm of breast: Secondary | ICD-10-CM

## 2023-02-26 ENCOUNTER — Ambulatory Visit: Payer: Medicare Other

## 2023-02-27 ENCOUNTER — Ambulatory Visit
Admission: EM | Admit: 2023-02-27 | Discharge: 2023-02-27 | Disposition: A | Discharge status: Home / Self Care | Payer: Medicare Other

## 2023-02-27 DIAGNOSIS — J0100 Acute maxillary sinusitis, unspecified: Secondary | ICD-10-CM | POA: Diagnosis not present

## 2023-02-27 DIAGNOSIS — J441 Chronic obstructive pulmonary disease with (acute) exacerbation: Secondary | ICD-10-CM

## 2023-02-27 MED ORDER — DOXYCYCLINE HYCLATE 100 MG PO CAPS: 100.0000 mg | ORAL_CAPSULE | Freq: Two times a day (BID) | ORAL | 0 refills | Status: AC

## 2023-02-27 MED ORDER — PREDNISONE 10 MG PO TABS: 40.0000 mg | ORAL_TABLET | Freq: Every day | ORAL | 0 refills | Status: AC

## 2023-02-27 MED ORDER — ALBUTEROL SULFATE HFA 108 (90 BASE) MCG/ACT IN AERS: 1.0000 | INHALATION_SPRAY | Freq: Four times a day (QID) | RESPIRATORY_TRACT | 0 refills | Status: AC | PRN

## 2023-02-27 NOTE — Discharge Instructions (Addendum)
Take the prednisone and doxycycline as directed.  Use the albuterol inhaler as directed.  Follow up with your primary care provider.

## 2023-02-27 NOTE — ED Triage Notes (Signed)
Patient to Urgent Care with complaints of  sinus pain and pressure/ productive cough/ chest congestion.   Reports her symptoms started 10 days ago. Finished z-pack today. Reports mucus production has improved but feels now like the congestion has settled in her chest.  Using breo/ symbicort. Reports concerns about bronchitis.

## 2023-02-27 NOTE — ED Provider Notes (Signed)
[name]    CSN: [id number] Arrival date & time: 02/27/23  1101      History   Chief Complaint Chief Complaint  Patient presents with   Nasal Congestion    HPI [name] [name] is a 77 y.o. female.  Patient presents with 10-day history of chest congestion, productive cough, sinus pressure, sinus pain, postnasal drip.  Her cough is productive of yellow phlegm.  No fever, chest pain, shortness of breath, or other symptoms.  She contacted her PCP and was treated with Zithromax; last dose taken today.  She reports decreased mucus production but feels some tightness when coughing.  She has been using Breo and Symbicort.  She does not currently have an albuterol inhaler.  Patient was seen here on 11/09/2022; diagnosed with COPD exacerbation; treated with Zithromax and prednisone.  The history is provided by the patient and medical records.    Past Medical History:  Diagnosis Date   Allergy    Anxiety    Asthma    BCC (basal cell carcinoma), leg, left 08/2017   Cancer (HCC)    Kidney cancer-Hypernephroma   Cellulitis of nasal    Cervical dysplasia    COPD (chronic obstructive pulmonary disease) (HCC)    Depression    Elevated cholesterol    GERD (gastroesophageal reflux disease)    Glaucoma    Hypertension    Lymphocytic colitis    Osteoporosis 04/2018   2016 T score -3.2 2019 T score -2.8   SCC (squamous cell carcinoma), leg, right 08/2017   Serum calcium elevated     Patient Active Problem List   Diagnosis Date Noted   GAD (generalized anxiety disorder) 06/18/2017   Chronic left-sided low back pain with left-sided sciatica 06/18/2017   Lymphocytic colitis 06/18/2017   Single kidney 05/05/2017   Depression 05/05/2017   Eosinophilia 11/27/2016   H/O sleep apnea 11/27/2016   Normocalcemic Hyperparathyroidism (HCC) 11/21/2016   Osteoporosis 11/21/2016   Allergic rhinitis 03/17/2016   Multiple nasal polyps 03/17/2016   Chronic obstructive airway disease  with asthma 12/24/2015   Vitamin D Deficiency 02/14/2014   Other abnormal glucose 02/14/2014   Encounter for long-term (current) use of other medications 12/13/2013   Hypertension    Hyperlipidemia    Cervical dysplasia    DUB (dysfunctional uterine bleeding)    Hypothyroidism 11/30/2007    Past Surgical History:  Procedure Laterality Date   ABDOMINAL HYSTERECTOMY  1989   TAH LSO   AUGMENTATION MAMMAPLASTY     Implants removed   COLPOSCOPY     COSMETIC SURGERY     EYE SURGERY  2017   KIDNEY REMOVED  2002   Left   NOSE SURGERY  2017   OOPHORECTOMY     LSO   TONSILLECTOMY      OB History     Gravida  1   Para  1   Term  1   Preterm      AB      Living  1      SAB      IAB      Ectopic      Multiple      Live Births               Home Medications    Prior to Admission medications   Medication Sig Start Date End Date Taking? Authorizing Provider  albuterol (VENTOLIN HFA) 108 (90 Base) MCG/ACT inhaler Inhale 1-2 puffs into the lungs every 6 (six) hours  as needed. 02/27/23  Yes Mickie Bail, NP  doxycycline (VIBRAMYCIN) 100 MG capsule Take 1 capsule (100 mg total) by mouth 2 (two) times daily for 7 days. 02/27/23 03/06/23 Yes Mickie Bail, NP  predniSONE (DELTASONE) 10 MG tablet Take 4 tablets (40 mg total) by mouth daily for 5 days. 02/27/23 03/04/23 Yes Mickie Bail, NP  albuterol (ACCUNEB) 0.63 MG/3ML nebulizer solution albuterol sulfate 0.63 mg/3 mL solution for nebulization  INHALE BY INHALATION ROUTE DAILY    [provider]  ALPRAZolam Prudy Feeler) 0.5 MG tablet Take 1-2 tablets (0.5-1 mg total) by mouth at bedtime as needed for sleep. 02/23/18   Porfirio Oar, PA  amLODipine (NORVASC) 5 MG tablet Take 0.5-1 tablets (2.5-5 mg total) by mouth daily. 02/23/18   Porfirio Oar, PA  azithromycin (ZITHROMAX) 250 MG tablet Take 1 tablet (250 mg total) by mouth daily. Take first 2 tablets together, then 1 every day until finished. Patient not  taking: Reported on 02/27/2023 11/09/22   Mickie Bail, NP  brimonidine [facility]) 0.2 % ophthalmic solution  10/19/16   [provider]  budesonide (PULMICORT) 0.5 MG/2ML nebulizer solution RINSE SINUSES WITH HALF OF SINUS RINSE BOTTLE THEN ADD ONE RESPULE AND MIX AND IRRIGATE SINUSES WITH REMAINING MIXTURE. PERFORM TWICE DAILY. 10/10/21   [provider]  cetirizine (ZYRTEC) 10 MG tablet Take 10 mg by mouth as needed for allergies.    [provider]  Cholecalciferol 100 MCG (4000 UT) CAPS Take 1 capsule by mouth daily.    [provider]  co-enzyme Q-10 50 MG capsule Take 50 mg by mouth daily.    [provider]  fluticasone furoate-vilanterol (BREO ELLIPTA) 200-25 MCG/INH AEPB Inhale 1 puff into the lungs daily. 09/15/18   Michele Mcalpine, MD  fluticasone furoate-vilanterol (BREO ELLIPTA) 200-25 MCG/INH AEPB Inhale 1 puff into the lungs daily. 08/02/20   Martina Sinner, MD  levothyroxine (SYNTHROID, LEVOTHROID) 100 MCG tablet TAKE 1 TABLET EVERY DAY 01/04/18   Porfirio Oar, PA  losartan (COZAAR) 50 MG tablet Take 50 mg by mouth daily. 06/17/17   [provider]  pantoprazole (PROTONIX) 40 MG tablet Take 40 mg by mouth daily.    [provider]  Probiotic Product (PROBIOTIC PO) Take 1 tablet by mouth at bedtime.     [provider]  RHOPRESSA 0.02 % SOLN  12/24/17   [provider]  rosuvastatin (CRESTOR) 10 MG tablet Take 10 mg by mouth daily. 06/17/17   [provider]  triamcinolone cream (KENALOG) 0.1 % APPLY TO AFFECTED AREA UP TO TWICE DAILY AS NEEDED FOR ECZEMA (NOT TO FACE, GROIN, UNDERARMS) 01/14/18   [provider]  venlafaxine XR (EFFEXOR-XR) 150 MG 24 hr capsule Take 1 capsule (150 mg total) daily with breakfast by mouth. 08/28/17   Jeffery, Chelle, PA  venlafaxine XR (EFFEXOR-XR) 75 MG 24 hr capsule TAKE ONE CAPSULE BY MOUTH ONCE DAILY WITH FOOD for 90    [provider]  Zoledronic  Acid (RECLAST IV) Inject into the vein. Patient not taking: No sig reported    [provider]    Family History Family History  Problem Relation Age of Onset   Hypertension Mother    Heart disease Mother    Asthma Mother    Mental illness Mother        depression and anxiety   Lung cancer Father    Cancer Father        lung   Hypertension Sister  Cancer Sister        Stem cell cancer   Hypertension Brother    Stroke Brother    Cancer Brother        prostate cancer   Ovarian cancer Maternal Aunt    Breast cancer Maternal Aunt        Age 57's   Cancer Maternal Uncle        Prostate cancer   Breast cancer Maternal Aunt        Age 28's   Hypertension Daughter    Arthritis Daughter     Social History Social History   Tobacco Use   Smoking status: Former    Packs/day: 5.00    Years: 10.00    Additional pack years: 0.00    Total pack years: 50.00    Types: Cigarettes    Quit date: 08/23/1983    Years since quitting: 39.5   Smokeless tobacco: Never  Vaping Use   Vaping Use: Never used  Substance Use Topics   Alcohol use: Yes    Alcohol/week: 21.0 standard drinks of alcohol    Types: 21 Standard drinks or equivalent per week    Comment: social   Drug use: No    Comment: cbd hemp     Allergies   Mirtazapine, Iodine, Iohexol, Penicillins, Sulfa antibiotics, and Neosporin [neomycin-polymyxin-gramicidin]   Review of Systems Review of Systems  Constitutional:  Negative for chills and fever.  HENT:  Positive for congestion, postnasal drip, sinus pressure and sinus pain. Negative for ear pain and sore throat.   Respiratory:  Positive for cough and chest tightness. Negative for shortness of breath and wheezing.   Cardiovascular:  Negative for chest pain and palpitations.     Physical Exam Triage Vital Signs ED Triage Vitals  Enc Vitals Group     BP      Pulse      Resp      Temp      Temp src      SpO2      Weight      Height      Head  Circumference      Peak Flow      Pain Score      Pain Loc      Pain Edu?      Excl. in GC?    No data found.  Updated Vital Signs BP (!) 115/58   Pulse 75   Temp 97.9 F (36.6 C)   Resp 18   SpO2 98%   Visual Acuity Right Eye Distance:   Left Eye Distance:   Bilateral Distance:    Right Eye Near:   Left Eye Near:    Bilateral Near:     Physical Exam Vitals and nursing note reviewed.  Constitutional:      General: She is not in acute distress.    Appearance: Normal appearance. She is well-developed. She is not ill-appearing.  HENT:     Right Ear: Tympanic membrane normal.     Left Ear: Tympanic membrane normal.     Nose: Congestion present.     Mouth/Throat:     Mouth: Mucous membranes are moist.     Pharynx: Oropharynx is clear.  Cardiovascular:     Rate and Rhythm: Normal rate and regular rhythm.     Heart sounds: Normal heart sounds.  Pulmonary:     Effort: Pulmonary effort is normal. No respiratory distress.     Breath sounds: Normal breath sounds. No wheezing.  Musculoskeletal:     Cervical back: Neck supple.  Skin:    General: Skin is warm and dry.  Neurological:     Mental Status: She is alert.  Psychiatric:        Mood and Affect: Mood normal.        Behavior: Behavior normal.      UC Treatments / Results  Labs (all labs ordered are listed, but only abnormal results are displayed) Labs Reviewed - No data to display  EKG   Radiology No results found.  Procedures Procedures (including critical care time)  Medications Ordered in UC Medications - No data to display  Initial Impression / Assessment and Plan / UC Course  I have reviewed the triage vital signs and the nursing notes.  Pertinent labs & imaging results that were available during my care of the patient were reviewed by me and considered in my medical decision making (see chart for details).    COPD exacerbation, acute sinusitis.  Patient took the last dose of Zithromax  today but is still experiencing symptoms.  No respiratory distress.  Her lungs are clear at this time and her O2 sat is 98% on room air.  Treating today with albuterol inhaler, prednisone, doxycycline.  Instructed her to schedule a follow-up appointment with her PCP.  Education provided on COPD exacerbation and sinusitis.  ED precautions provided.  Patient agrees to plan of care.  Final Clinical Impressions(s) / UC Diagnoses   Final diagnoses:  COPD exacerbation (HCC)  Acute non-recurrent maxillary sinusitis     Discharge Instructions      Take the prednisone and doxycycline as directed.  Use the albuterol inhaler as directed.  Follow up with your primary care provider.        ED Prescriptions     Medication Sig Dispense Auth. Provider   albuterol (VENTOLIN HFA) 108 (90 Base) MCG/ACT inhaler Inhale 1-2 puffs into the lungs every 6 (six) hours as needed. 18 g Mickie Bail, NP   predniSONE (DELTASONE) 10 MG tablet Take 4 tablets (40 mg total) by mouth daily for 5 days. 20 tablet Mickie Bail, NP   doxycycline (VIBRAMYCIN) 100 MG capsule Take 1 capsule (100 mg total) by mouth 2 (two) times daily for 7 days. 14 capsule Mickie Bail, NP      PDMP not reviewed this encounter.   Mickie Bail, NP 02/27/23 1140

## 2023-03-27 ENCOUNTER — Ambulatory Visit
Admission: RE | Admit: 2023-03-27 | Discharge: 2023-03-27 | Disposition: A | Discharge status: Home / Self Care | Payer: Medicare Other | Source: Ambulatory Visit | Attending: Internal Medicine | Admitting: Internal Medicine

## 2023-03-27 DIAGNOSIS — Z1231 Encounter for screening mammogram for malignant neoplasm of breast: Secondary | ICD-10-CM

## 2023-06-26 ENCOUNTER — Emergency Department (HOSPITAL_BASED_OUTPATIENT_CLINIC_OR_DEPARTMENT_OTHER)
Admission: EM | Admit: 2023-06-26 | Discharge: 2023-06-26 | Disposition: A | Discharge status: Home / Self Care | Payer: Medicare Other | Attending: Emergency Medicine | Admitting: Emergency Medicine

## 2023-06-26 ENCOUNTER — Other Ambulatory Visit: Payer: Self-pay

## 2023-06-26 DIAGNOSIS — I10 Essential (primary) hypertension: Secondary | ICD-10-CM | POA: Diagnosis not present

## 2023-06-26 DIAGNOSIS — F13239 Sedative, hypnotic or anxiolytic dependence with withdrawal, unspecified: Secondary | ICD-10-CM | POA: Insufficient documentation

## 2023-06-26 DIAGNOSIS — Z79899 Other long term (current) drug therapy: Secondary | ICD-10-CM | POA: Insufficient documentation

## 2023-06-26 DIAGNOSIS — R451 Restlessness and agitation: Secondary | ICD-10-CM | POA: Insufficient documentation

## 2023-06-26 DIAGNOSIS — R42 Dizziness and giddiness: Secondary | ICD-10-CM | POA: Diagnosis not present

## 2023-06-26 DIAGNOSIS — R519 Headache, unspecified: Secondary | ICD-10-CM | POA: Diagnosis not present

## 2023-06-26 DIAGNOSIS — F13939 Sedative, hypnotic or anxiolytic use, unspecified with withdrawal, unspecified: Secondary | ICD-10-CM

## 2023-06-26 DIAGNOSIS — R110 Nausea: Secondary | ICD-10-CM | POA: Insufficient documentation

## 2023-06-26 LAB — CBC WITH DIFFERENTIAL/PLATELET
Abs Immature Granulocytes: 0.03 10*3/uL (ref 0.00–0.07)
Basophils Absolute: 0 10*3/uL (ref 0.0–0.1)
Basophils Relative: 0 %
Eosinophils Absolute: 0.2 10*3/uL (ref 0.0–0.5)
Eosinophils Relative: 2 %
HCT: 38.2 % (ref 36.0–46.0)
Hemoglobin: 13.4 g/dL (ref 12.0–15.0)
Immature Granulocytes: 0 %
Lymphocytes Relative: 24 %
Lymphs Abs: 2.2 10*3/uL (ref 0.7–4.0)
MCH: 31.9 pg (ref 26.0–34.0)
MCHC: 35.1 g/dL (ref 30.0–36.0)
MCV: 91 fL (ref 80.0–100.0)
Monocytes Absolute: 0.7 10*3/uL (ref 0.1–1.0)
Monocytes Relative: 7 %
Neutro Abs: 5.9 10*3/uL (ref 1.7–7.7)
Neutrophils Relative %: 67 %
Platelets: 151 10*3/uL (ref 150–400)
RBC: 4.2 MIL/uL (ref 3.87–5.11)
RDW: 12.6 % (ref 11.5–15.5)
WBC: 9 10*3/uL (ref 4.0–10.5)
nRBC: 0 % (ref 0.0–0.2)

## 2023-06-26 LAB — TSH: TSH: 5.259 u[IU]/mL — ABNORMAL HIGH (ref 0.350–4.500)

## 2023-06-26 LAB — BASIC METABOLIC PANEL
Anion gap: 10 (ref 5–15)
BUN: 11 mg/dL (ref 8–23)
CO2: 21 mmol/L — ABNORMAL LOW (ref 22–32)
Calcium: 8.6 mg/dL — ABNORMAL LOW (ref 8.9–10.3)
Chloride: 102 mmol/L (ref 98–111)
Creatinine, Ser: 0.77 mg/dL (ref 0.44–1.00)
GFR, Estimated: >60 mL/min (ref >60)
Glucose, Bld: 110 mg/dL — ABNORMAL HIGH (ref 70–99)
Potassium: 3.7 mmol/L (ref 3.5–5.1)
Sodium: 133 mmol/L — ABNORMAL LOW (ref 135–145)

## 2023-06-26 LAB — TROPONIN I (HIGH SENSITIVITY): Troponin I (High Sensitivity): 8 ng/L (ref <18)

## 2023-06-26 MED ORDER — ONDANSETRON HCL 4 MG/2ML IJ SOLN
4.0000 mg | Freq: Once | INTRAMUSCULAR | Status: AC
  Administered 2023-06-26: 4 mg via INTRAVENOUS
  Filled 2023-06-26: qty 2

## 2023-06-26 MED ORDER — LORAZEPAM 2 MG/ML IJ SOLN
0.5000 mg | Freq: Once | INTRAMUSCULAR | Status: AC
  Administered 2023-06-26: 0.5 mg via INTRAVENOUS
  Filled 2023-06-26: qty 1

## 2023-06-26 MED ORDER — LORAZEPAM 1 MG PO TABS: 1.0000 mg | ORAL_TABLET | Freq: Three times a day (TID) | ORAL | 0 refills | Status: AC | PRN

## 2023-06-26 MED ORDER — SODIUM CHLORIDE 0.9 % IV BOLUS
500.0000 mL | Freq: Once | INTRAVENOUS | Status: AC
  Administered 2023-06-26: 500 mL via INTRAVENOUS

## 2023-06-26 NOTE — ED Triage Notes (Signed)
Pt arrived POV with husband. Pt caox4, ambulatory c/o dizziness, intermittent headache, weakness, nausea, intermittent sweating stating she has "taken 0.5 mg of xanax every night for years and I haven't had it since Sunday night and I feel like I am having withdrawal." Pt reports s/s have been worsening since Tuesday with minimal sleep.

## 2023-06-26 NOTE — Discharge Instructions (Signed)
You were seen in the emergency department for dizziness nausea insomnia.  This possibly is related to withdrawal from your alprazolam.  We are prescribing you a short course of lorazepam.  Please contact your primary care doctor to discuss a taper.  Continue your other medications.  Return to the emergency department if any worsening or concerning symptoms.  We also sent a thyroid test and this is pending at time of discharge.  You can follow-up this result with your primary care doctor.

## 2023-06-26 NOTE — ED Provider Notes (Signed)
[place] EMERGENCY DEPARTMENT AT [facility] Provider Note   CSN: [id number] Arrival date & time: 06/26/23  0818     History  Chief Complaint  Patient presents with   Withdrawal    [name] [name] is a 77 y.o. female.  She has a history of hypertension hyperlipidemia.  She said she is at increased stressors recently and took extra of her alprazolam.  She ran out about 5 days ago and since then she is experienced increased restlessness nausea feeling weak and shaky, sweating.  She said she feels like she is in withdrawal.  Poor sleeping.  She cannot refill her medication until next week and she feels like she wants to get off the medicine.  She has an appointment with her PCP in October.  The history is provided by the patient.  Dizziness Quality:  Lightheadedness Onset quality:  Gradual Duration:  4 days Timing:  Intermittent Progression:  Unchanged Chronicity:  New Relieved by:  Nothing Associated symptoms: headaches and nausea   Associated symptoms: no chest pain and no shortness of breath        Home Medications Prior to Admission medications   Medication Sig Start Date End Date Taking? Authorizing Provider  albuterol (ACCUNEB) 0.63 MG/3ML nebulizer solution albuterol sulfate 0.63 mg/3 mL solution for nebulization  INHALE BY INHALATION ROUTE DAILY    [provider]  albuterol (VENTOLIN HFA) 108 (90 Base) MCG/ACT inhaler Inhale 1-2 puffs into the lungs every 6 (six) hours as needed. 02/27/23   Mickie Bail, NP  ALPRAZolam Prudy Feeler) 0.5 MG tablet Take 1-2 tablets (0.5-1 mg total) by mouth at bedtime as needed for sleep. 02/23/18   Porfirio Oar, PA  amLODipine (NORVASC) 5 MG tablet Take 0.5-1 tablets (2.5-5 mg total) by mouth daily. 02/23/18   Porfirio Oar, PA  azithromycin (ZITHROMAX) 250 MG tablet Take 1 tablet (250 mg total) by mouth daily. Take first 2 tablets together, then 1 every day until finished. Patient not taking: Reported on 02/27/2023  11/09/22   Mickie Bail, NP  brimonidine [facility]) 0.2 % ophthalmic solution  10/19/16   [provider]  budesonide (PULMICORT) 0.5 MG/2ML nebulizer solution RINSE SINUSES WITH HALF OF SINUS RINSE BOTTLE THEN ADD ONE RESPULE AND MIX AND IRRIGATE SINUSES WITH REMAINING MIXTURE. PERFORM TWICE DAILY. 10/10/21   [provider]  cetirizine (ZYRTEC) 10 MG tablet Take 10 mg by mouth as needed for allergies.    [provider]  Cholecalciferol 100 MCG (4000 UT) CAPS Take 1 capsule by mouth daily.    [provider]  co-enzyme Q-10 50 MG capsule Take 50 mg by mouth daily.    [provider]  fluticasone furoate-vilanterol (BREO ELLIPTA) 200-25 MCG/INH AEPB Inhale 1 puff into the lungs daily. 09/15/18   Michele Mcalpine, MD  fluticasone furoate-vilanterol (BREO ELLIPTA) 200-25 MCG/INH AEPB Inhale 1 puff into the lungs daily. 08/02/20   Martina Sinner, MD  levothyroxine (SYNTHROID, LEVOTHROID) 100 MCG tablet TAKE 1 TABLET EVERY DAY 01/04/18   Porfirio Oar, PA  losartan (COZAAR) 50 MG tablet Take 50 mg by mouth daily. 06/17/17   [provider]  pantoprazole (PROTONIX) 40 MG tablet Take 40 mg by mouth daily.    [provider]  Probiotic Product (PROBIOTIC PO) Take 1 tablet by mouth at bedtime.     [provider]  RHOPRESSA 0.02 % SOLN  12/24/17   [provider]  rosuvastatin (CRESTOR) 10 MG tablet Take 10 mg by mouth daily.  06/17/17   [provider]  triamcinolone cream (KENALOG) 0.1 % APPLY TO AFFECTED AREA UP TO TWICE DAILY AS NEEDED FOR ECZEMA (NOT TO FACE, GROIN, UNDERARMS) 01/14/18   [provider]  venlafaxine XR (EFFEXOR-XR) 150 MG 24 hr capsule Take 1 capsule (150 mg total) daily with breakfast by mouth. 08/28/17   Jeffery, Chelle, PA  venlafaxine XR (EFFEXOR-XR) 75 MG 24 hr capsule TAKE ONE CAPSULE BY MOUTH ONCE DAILY WITH FOOD for 90    [provider]  Zoledronic Acid (RECLAST IV) Inject into  the vein. Patient not taking: No sig reported    [provider]      Allergies    Mirtazapine, Iodine, Iohexol, Penicillins, Sulfa antibiotics, and Neosporin [neomycin-polymyxin-gramicidin]    Review of Systems   Review of Systems  Constitutional:  Positive for diaphoresis. Negative for fever.  Eyes:  Negative for visual disturbance.  Respiratory:  Negative for shortness of breath.   Cardiovascular:  Negative for chest pain.  Gastrointestinal:  Positive for nausea.  Genitourinary:  Negative for dysuria.  Neurological:  Positive for dizziness, tremors and headaches.    Physical Exam Updated Vital Signs BP (!) 158/84 (BP Location: Right Arm)   Pulse 84   Temp 98.6 F (37 C) (Oral)   Resp 18   Ht 5\' 5"  (1.651 m)   Wt 79.4 kg   SpO2 99%   BMI 29.12 kg/m  Physical Exam Vitals and nursing note reviewed.  Constitutional:      General: She is not in acute distress.    Appearance: Normal appearance. She is well-developed.  HENT:     Head: Normocephalic and atraumatic.  Eyes:     Conjunctiva/sclera: Conjunctivae normal.  Cardiovascular:     Rate and Rhythm: Normal rate and regular rhythm.     Heart sounds: No murmur heard. Pulmonary:     Effort: Pulmonary effort is normal. No respiratory distress.     Breath sounds: Normal breath sounds.  Abdominal:     Palpations: Abdomen is soft.     Tenderness: There is no abdominal tenderness. There is no guarding or rebound.  Musculoskeletal:        General: No swelling.     Cervical back: Neck supple.  Skin:    General: Skin is warm and dry.     Capillary Refill: Capillary refill takes less than 2 seconds.  Neurological:     General: No focal deficit present.     Mental Status: She is alert.     Motor: No weakness.     ED Results / Procedures / Treatments   Labs (all labs ordered are listed, but only abnormal results are displayed) Labs Reviewed  BASIC METABOLIC PANEL - Abnormal; Notable for the following  components:      Result Value   Sodium 133 (*)    CO2 21 (*)    Glucose, Bld 110 (*)    Calcium 8.6 (*)    All other components within normal limits  TSH - Abnormal; Notable for the following components:   TSH 5.259 (*)    All other components within normal limits  CBC WITH DIFFERENTIAL/PLATELET  TROPONIN I (HIGH SENSITIVITY)    EKG EKG Interpretation Date/Time:  Friday June 26 2023 08:55:44 EDT Ventricular Rate:  80 PR Interval:  199 QRS Duration:  133 QT Interval:  428 QTC Calculation: 494 R Axis:   -39  Text Interpretation: Sinus rhythm Left bundle branch block COPY Confirmed by Meridee Score [phone]) on 06/26/2023  9:10:49 AM  Radiology No results found.  Procedures Procedures    Medications Ordered in ED Medications  sodium chloride 0.9 % bolus 500 mL (has no administration in time range)  ondansetron (ZOFRAN) injection 4 mg (has no administration in time range)  LORazepam (ATIVAN) injection 0.5 mg (has no administration in time range)    ED Course/ Medical Decision Making/ A&P Clinical Course as of 06/26/23 1711  Fri Jun 26, 2023  1000 Patient feels much better after medication.  I do not feel she needs a second troponin.  TSH is still pending.  Will prescribe her short course of Ativan until she can restart her alprazolam.  She understands to reach out to her PCP to discuss how to taper. [MB]    Clinical Course User Index [MB] Terrilee Files, MD                                 Medical Decision Making Amount and/or Complexity of Data Reviewed Labs: ordered.  Risk Prescription drug management.   This patient complains of withdrawal from alprazolam, nausea dizziness insomnia; this involves an extensive number of treatment Options and is a complaint that carries with it a high risk of complications and morbidity. The differential includes benzodiazepine withdrawal, dehydration, metabolic derangement, thyroid dysfunction, ACS  I ordered, reviewed  and interpreted labs, which included CBC normal chemistries with mildly low bicarb low sodium, troponin unremarkable, TSH pending at time of signout I ordered medication IV fluids IV nausea medication and IV benzodiazepine and reviewed PMP when indicated. Additional history obtained from patient's companion Previous records obtained and reviewed in epic including PCP notes Cardiac monitoring reviewed, sinus rhythm Social determinants considered, ongoing stressors Critical Interventions: None  After the interventions stated above, I reevaluated the patient and found patient to be feeling much better Admission and further testing considered, will cover her with a prescription for benzodiazepines until she can talk to her doctor about an appropriate taper.  She understands return if any worsening symptoms         Final Clinical Impression(s) / ED Diagnoses Final diagnoses:  Benzodiazepine withdrawal with complication [facility])    Rx / DC Orders ED Discharge Orders          Ordered    LORazepam (ATIVAN) 1 MG tablet  Every 8 hours PRN        06/26/23 1003              Terrilee Files, MD 06/26/23 1713

## 2023-08-27 ENCOUNTER — Telehealth: Payer: Self-pay | Admitting: Gastroenterology

## 2023-08-27 NOTE — Telephone Encounter (Signed)
Good afternoon Dr. Barron Alvine  The following patient is requesting to have continued GI care with you. She has been refferrred for history of lymphocitic colitis. She wishes to not continue care with Dr. Loreta Ave because she does not do procedures and Dr. Ewing Schlein is retiring. Records are available in media. Please review and advise of scheduling. Thnk you.

## 2023-09-06 ENCOUNTER — Emergency Department (HOSPITAL_COMMUNITY): Payer: Medicare Other

## 2023-09-06 ENCOUNTER — Encounter (HOSPITAL_COMMUNITY): Payer: Self-pay

## 2023-09-06 ENCOUNTER — Other Ambulatory Visit: Payer: Self-pay

## 2023-09-06 ENCOUNTER — Emergency Department (HOSPITAL_COMMUNITY)
Admission: EM | Admit: 2023-09-06 | Discharge: 2023-09-06 | Disposition: A | Discharge status: Home / Self Care | Payer: Medicare Other | Attending: Emergency Medicine | Admitting: Emergency Medicine

## 2023-09-06 DIAGNOSIS — R0789 Other chest pain: Secondary | ICD-10-CM | POA: Insufficient documentation

## 2023-09-06 DIAGNOSIS — Z7951 Long term (current) use of inhaled steroids: Secondary | ICD-10-CM | POA: Insufficient documentation

## 2023-09-06 DIAGNOSIS — I10 Essential (primary) hypertension: Secondary | ICD-10-CM | POA: Insufficient documentation

## 2023-09-06 DIAGNOSIS — M1712 Unilateral primary osteoarthritis, left knee: Secondary | ICD-10-CM | POA: Diagnosis not present

## 2023-09-06 DIAGNOSIS — J449 Chronic obstructive pulmonary disease, unspecified: Secondary | ICD-10-CM | POA: Insufficient documentation

## 2023-09-06 DIAGNOSIS — Z79899 Other long term (current) drug therapy: Secondary | ICD-10-CM | POA: Insufficient documentation

## 2023-09-06 DIAGNOSIS — R079 Chest pain, unspecified: Secondary | ICD-10-CM | POA: Diagnosis present

## 2023-09-06 LAB — BASIC METABOLIC PANEL
Anion gap: 7 (ref 5–15)
BUN: 17 mg/dL (ref 8–23)
CO2: 22 mmol/L (ref 22–32)
Calcium: 9.1 mg/dL (ref 8.9–10.3)
Chloride: 107 mmol/L (ref 98–111)
Creatinine, Ser: 0.91 mg/dL (ref 0.44–1.00)
GFR, Estimated: >60 mL/min (ref >60)
Glucose, Bld: 96 mg/dL (ref 70–99)
Potassium: 4.1 mmol/L (ref 3.5–5.1)
Sodium: 136 mmol/L (ref 135–145)

## 2023-09-06 LAB — CBC
HCT: 41 % (ref 36.0–46.0)
Hemoglobin: 13.7 g/dL (ref 12.0–15.0)
MCH: 30.9 pg (ref 26.0–34.0)
MCHC: 33.4 g/dL (ref 30.0–36.0)
MCV: 92.3 fL (ref 80.0–100.0)
Platelets: 202 10*3/uL (ref 150–400)
RBC: 4.44 MIL/uL (ref 3.87–5.11)
RDW: 12.5 % (ref 11.5–15.5)
WBC: 7.6 10*3/uL (ref 4.0–10.5)
nRBC: 0 % (ref 0.0–0.2)

## 2023-09-06 LAB — D-DIMER, QUANTITATIVE: D-Dimer, Quant: 0.29 ug{FEU}/mL (ref 0.00–0.50)

## 2023-09-06 LAB — TROPONIN I (HIGH SENSITIVITY): Troponin I (High Sensitivity): 4 ng/L (ref <18)

## 2023-09-06 NOTE — ED Triage Notes (Signed)
Patient is here for evaluation after sustaining a fall on 08/21/2023. Reports falling on her left side. States she was seen by her PCP and imaging was done to check for broken ribs, but was negative. Patient states in the last week she is having increase in generalized pain all over, and having increase in amount of pain on the left chest/breast area. Pt denies any shortness of breath. Reports feeling fatigued all the time and in pain all the time now.

## 2023-09-06 NOTE — Discharge Instructions (Addendum)
You were seen emergency room today for chest pain.  Your lab work came back reassuring and D-dimer was negative thus ruling out blood clot.  I would recommend following up with primary care to ensure resolution of symptoms.  As we discussed, I do recommend following up with orthopedics for your left knee pain.  Please alternate Tylenol and ibuprofen at home for pain control.  You can also use ice and heat return to emergency room with any new or worsening symptoms.

## 2023-09-06 NOTE — ED Provider Notes (Signed)
[facility]GENCY DEPARTMENT AT [facility] Provider Note   CSN: [id number] Arrival date & time: 09/06/23  1404     History  Chief Complaint  Patient presents with   Generalized Body Aches   Chest Pain    [name] [name] is a 77 y.o. female with past medical history of hyperthyroidism hypertension hyperlipidemia COPD presenting to emergency room with left-sided chest pain.  Patient reports chest pain started 4 to 5 days ago chest pains worse with taking deep breath in, denies fever chills shortness of breath dizziness or lightheadedness.  Patient reports she fell a month ago and was having some left knee pain but able to ambulate.  Patient reports the left knee pain seems to be getting worse and not associated with any laxity. Patient notes history of neuropathy in both legs but does not know any worsening change in sensation or weakness associated with this..  Reports history of low back pain related to sciatica, this is also chronic and not associated with any acute changes.  Patient has already been evaluated by primary care to check for broken ribs or infection reporting that imaging with primary care was normal.  Denies any URI-like symptoms, no fever no runny nose no cough.  Patient reports she is on doxycycline but unclear to why this was started.   Chest Pain      Home Medications Prior to Admission medications   Medication Sig Start Date End Date Taking? Authorizing Provider  albuterol (ACCUNEB) 0.63 MG/3ML nebulizer solution albuterol sulfate 0.63 mg/3 mL solution for nebulization  INHALE BY INHALATION ROUTE DAILY    [provider]  albuterol (VENTOLIN HFA) 108 (90 Base) MCG/ACT inhaler Inhale 1-2 puffs into the lungs every 6 (six) hours as needed. 02/27/23   Mickie Bail, NP  ALPRAZolam Prudy Feeler) 0.5 MG tablet Take 1-2 tablets (0.5-1 mg total) by mouth at bedtime as needed for sleep. 02/23/18   Porfirio Oar, PA  amLODipine (NORVASC) 5 MG tablet  Take 0.5-1 tablets (2.5-5 mg total) by mouth daily. 02/23/18   Porfirio Oar, PA  azithromycin (ZITHROMAX) 250 MG tablet Take 1 tablet (250 mg total) by mouth daily. Take first 2 tablets together, then 1 every day until finished. Patient not taking: Reported on 02/27/2023 11/09/22   Mickie Bail, NP  brimonidine [facility]) 0.2 % ophthalmic solution  10/19/16   [provider]  budesonide (PULMICORT) 0.5 MG/2ML nebulizer solution RINSE SINUSES WITH HALF OF SINUS RINSE BOTTLE THEN ADD ONE RESPULE AND MIX AND IRRIGATE SINUSES WITH REMAINING MIXTURE. PERFORM TWICE DAILY. 10/10/21   [provider]  cetirizine (ZYRTEC) 10 MG tablet Take 10 mg by mouth as needed for allergies.    [provider]  Cholecalciferol 100 MCG (4000 UT) CAPS Take 1 capsule by mouth daily.    [provider]  co-enzyme Q-10 50 MG capsule Take 50 mg by mouth daily.    [provider]  fluticasone furoate-vilanterol (BREO ELLIPTA) 200-25 MCG/INH AEPB Inhale 1 puff into the lungs daily. 09/15/18   Michele Mcalpine, MD  fluticasone furoate-vilanterol (BREO ELLIPTA) 200-25 MCG/INH AEPB Inhale 1 puff into the lungs daily. 08/02/20   Martina Sinner, MD  levothyroxine (SYNTHROID, LEVOTHROID) 100 MCG tablet TAKE 1 TABLET EVERY DAY 01/04/18   Porfirio Oar, PA  LORazepam (ATIVAN) 1 MG tablet Take 1 tablet (1 mg total) by mouth every 8 (eight) hours as needed for anxiety. 06/26/23   Terrilee Files, MD  losartan (COZAAR) 50 MG  tablet Take 50 mg by mouth daily. 06/17/17   [provider]  pantoprazole (PROTONIX) 40 MG tablet Take 40 mg by mouth daily.    [provider]  Probiotic Product (PROBIOTIC PO) Take 1 tablet by mouth at bedtime.     [provider]  RHOPRESSA 0.02 % SOLN  12/24/17   [provider]  rosuvastatin (CRESTOR) 10 MG tablet Take 10 mg by mouth daily. 06/17/17   [provider]  triamcinolone cream (KENALOG) 0.1 % APPLY TO AFFECTED AREA  UP TO TWICE DAILY AS NEEDED FOR ECZEMA (NOT TO FACE, GROIN, UNDERARMS) 01/14/18   [provider]  venlafaxine XR (EFFEXOR-XR) 150 MG 24 hr capsule Take 1 capsule (150 mg total) daily with breakfast by mouth. 08/28/17   Jeffery, Chelle, PA  venlafaxine XR (EFFEXOR-XR) 75 MG 24 hr capsule TAKE ONE CAPSULE BY MOUTH ONCE DAILY WITH FOOD for 90    [provider]  Zoledronic Acid (RECLAST IV) Inject into the vein. Patient not taking: No sig reported    [provider]      Allergies    Mirtazapine, Iodine, Iohexol, Penicillins, Sulfa antibiotics, and Neosporin [neomycin-polymyxin-gramicidin]    Review of Systems   Review of Systems  Cardiovascular:  Positive for chest pain.    Physical Exam Updated Vital Signs BP (!) 146/102   Pulse 93   Temp 98.1 F (36.7 C) (Oral)   Resp 18   Ht 5\' 5"  (1.651 m)   Wt 79.4 kg   SpO2 98%   BMI 29.13 kg/m  Physical Exam Vitals and nursing note reviewed.  Constitutional:      General: She is not in acute distress.    Appearance: She is not toxic-appearing.  HENT:     Head: Normocephalic and atraumatic.  Eyes:     General: No scleral icterus.    Conjunctiva/sclera: Conjunctivae normal.  Cardiovascular:     Rate and Rhythm: Normal rate and regular rhythm.     Pulses: Normal pulses.     Heart sounds: Normal heart sounds.  Pulmonary:     Effort: Pulmonary effort is normal. No respiratory distress.     Breath sounds: Normal breath sounds. No wheezing.  Abdominal:     General: Abdomen is flat. Bowel sounds are normal.     Palpations: Abdomen is soft.     Tenderness: There is no abdominal tenderness.  Musculoskeletal:     Comments: No obvious left-sided lower extremity edema however does have left calf tenderness.  Tenderness to palpation over left patellar tendon.  Normal range of motion moving extremity without difficulty neurovascularly intact  Skin:    General: Skin is warm and dry.     Capillary Refill: Capillary  refill takes less than 2 seconds.     Findings: No lesion.  Neurological:     General: No focal deficit present.     Mental Status: She is alert and oriented to person, place, and time. Mental status is at baseline.     ED Results / Procedures / Treatments   Labs (all labs ordered are listed, but only abnormal results are displayed) Labs Reviewed  BASIC METABOLIC PANEL  CBC  D-DIMER, QUANTITATIVE  TROPONIN I (HIGH SENSITIVITY)    EKG None  Radiology DG Knee 2 Views Left  Result Date: 09/06/2023 CLINICAL DATA:  fall with pain EXAM: LEFT KNEE - 1-2 VIEW COMPARISON:  None Available. FINDINGS: No acute fracture or dislocation. Mild joint space narrowing and osteophyte formation of the lateral  compartment. No area of erosion or osseous destruction. No unexpected radiopaque foreign body. Soft tissues are unremarkable. IMPRESSION: 1. No acute fracture or dislocation. 2. Mild degenerative changes of the lateral compartment. Electronically Signed   By: Meda Klinefelter M.D.   On: 09/06/2023 16:17   DG Chest 2 View  Result Date: 09/06/2023 CLINICAL DATA:  Weakness EXAM: CHEST - 2 VIEW COMPARISON:  06/17/2017 FINDINGS: The heart size and mediastinal contours are within normal limits. Aortic atherosclerosis. Minimal atelectasis at the left lung base. The visualized skeletal structures are unremarkable. IMPRESSION: No active cardiopulmonary disease. Electronically Signed   By: Jasmine Pang M.D.   On: 09/06/2023 15:25    Procedures Procedures    Medications Ordered in ED Medications - No data to display  ED Course/ Medical Decision Making/ A&P                                 Medical Decision Making Amount and/or Complexity of Data Reviewed Labs: ordered. Radiology: ordered.   GIFT REIMERS 77 y.o. presented today for chest pain. Working DDx that I considered at this time includes, but not limited to, ACS, GERD, pe, pna, aortic dissection, pneumothorax, MSK path, anemia,  esophageal rupture, CHF exacerbation, valvular disorder, myocarditis, pericarditis, endocarditis, pericardial effusion/cardiac tamponade, pulmonary edema, gastritis/PUD, esophagitis.  R/o Dx: Aortic Dissection: less likely based on the history of present illness - location, quality, onset, and severity of symptoms in this case.   Review of prior external notes: none  Unique Tests and My Interpretation:  EKG: Rate, rhythm, axis, intervals all examined: Sinus without ST changes Troponin: 4 CXR: No acute cardiopulmonary pathology X-ray left knee: No acute findings CBC: No leukocytosis, no anemia D-dimer 0.29 BMP: No electrolyte abnormality or kidney dysfunction   Problem List / ED Course / Critical interventions / Medication management  Patient reporting to emergency room with chest pain.  Chest pain is worse with taking deep breath then and reproducible to palpation thus I doubt this is cardiac in nature.  Patient had negative troponin and second troponin was not obtained as chest pain has been ongoing since this morning for over 6 hours.  D-dimer negative thus doubt this is pulmonary embolism.  Patient does not have fluid overload thus not consistent with congestive heart failure.  Patient's lab work was reassuring.  Patient has negative chest x-ray without any acute abnormality.  Of note patient did have swelling and pain of left knee.  X-ray shows lateral osteoarthritis discussed findings on imaging with patient.  Patient requesting discharge with close primary care follow-up.  Patient is already established with orthopedic doctor this will follow-up with them.  Patient hemodynamically stable throughout today and well-appearing.  Able to move extremities without difficult.  No sign of systemic illness.  Of note patient has generalized bodyaches noted in triage note however on review of history discussed with patient it is clear that she has chronic and ongoing low back pain as well as neuropathy  without any acute changes.  I feel this is unrelated to current concern of her chest pain.   Medication offered but declined Patients vitals assessed. Upon arrival patient is hemodynamically stable.  I have reviewed the patients home medicines and have made adjustments as needed    No consults.   Plan:  F/u w/ PCP to ensure resolution of sx.  Patient was given return precautions. Patient stable for discharge at this time.  Patient  educated on sx/ dx and verbalized understanding of plan.  Will return to ER w/ new or worsening sx.          Final Clinical Impression(s) / ED Diagnoses Final diagnoses:  Chest wall pain  Arthritis of left knee    Rx / DC Orders ED Discharge Orders     None         Smitty Knudsen, PA-C 09/06/23 1640    Horton, Clabe Seal, DO 09/06/23 2039

## 2023-09-09 ENCOUNTER — Other Ambulatory Visit (HOSPITAL_BASED_OUTPATIENT_CLINIC_OR_DEPARTMENT_OTHER): Payer: Self-pay | Admitting: Internal Medicine

## 2023-09-09 DIAGNOSIS — M810 Age-related osteoporosis without current pathological fracture: Secondary | ICD-10-CM

## 2023-10-04 ENCOUNTER — Ambulatory Visit
Admission: EM | Admit: 2023-10-04 | Discharge: 2023-10-04 | Disposition: A | Discharge status: Home / Self Care | Payer: Medicare Other | Attending: Emergency Medicine | Admitting: Emergency Medicine

## 2023-10-04 DIAGNOSIS — U071 COVID-19: Secondary | ICD-10-CM | POA: Diagnosis not present

## 2023-10-04 MED ORDER — BENZONATATE 100 MG PO CAPS: 100.0000 mg | ORAL_CAPSULE | Freq: Three times a day (TID) | ORAL | 0 refills | Status: AC

## 2023-10-04 MED ORDER — AZITHROMYCIN 250 MG PO TABS: 250.0000 mg | ORAL_TABLET | Freq: Every day | ORAL | 0 refills | Status: AC

## 2023-10-04 NOTE — ED Triage Notes (Signed)
Cough, congestion, fatigue x 10 days. Pt states she was positive for Covid Friday. Taking mucinex.

## 2023-10-04 NOTE — Discharge Instructions (Signed)
Your symptoms are caused by COVID-19 patient is a virus and should steadily improve in time  You may use Tessalon pill every 8 hours as needed to help with coughing, continue use of Mucinex in addition  On exam lungs are clear, low suspicion that you have pneumonia however if your symptoms have not improved by Friday, December 27 you may pick up azithromycin from the pharmacy and begin use  You can take Tylenol and/or Ibuprofen as needed for fever reduction and pain relief.   For cough: honey 1/2 to 1 teaspoon (you can dilute the honey in water or another fluid).  You can also use guaifenesin and dextromethorphan for cough. You can use a humidifier for chest congestion and cough.  If you don't have a humidifier, you can sit in the bathroom with the hot shower running.      For sore throat: try warm salt water gargles, cepacol lozenges, throat spray, warm tea or water with lemon/honey, popsicles or ice, or OTC cold relief medicine for throat discomfort.   For congestion: take a daily anti-histamine like Zyrtec, Claritin, and a oral decongestant, such as pseudoephedrine.  You can also use Flonase 1-2 sprays in each nostril daily.   It is important to stay hydrated: drink plenty of fluids (water, gatorade/powerade/pedialyte, juices, or teas) to keep your throat moisturized and help further relieve irritation/discomfort.

## 2023-10-04 NOTE — ED Provider Notes (Signed)
[name] [name]    CSN: [id number] Arrival date & time: 10/04/23  1027      History   Chief Complaint Chief Complaint  Patient presents with   Cough    HPI [name] [name] is a 77 y.o. female.   Patient presents for evaluation of nasal congestion, chest congestion and a productive cough present for 10 days.  She had increased fatigue.  Home COVID test positive, sick contact was in household.  Shortness of breath and wheezing, history of asthma, inhalers available.  Has attempted use of Mucinex additionally.  Denies fever, ear pain, sore throat or headaches.  Past Medical History:  Diagnosis Date   Allergy    Anxiety    Asthma    BCC (basal cell carcinoma), leg, left 08/2017   Cancer (HCC)    Kidney cancer-Hypernephroma   Cellulitis of nasal    Cervical dysplasia    COPD (chronic obstructive pulmonary disease) (HCC)    Depression    Elevated cholesterol    GERD (gastroesophageal reflux disease)    Glaucoma    Hypertension    Lymphocytic colitis    Osteoporosis 04/2018   2016 T score -3.2 2019 T score -2.8   SCC (squamous cell carcinoma), leg, right 08/2017   Serum calcium elevated     Patient Active Problem List   Diagnosis Date Noted   GAD (generalized anxiety disorder) 06/18/2017   Chronic left-sided low back pain with left-sided sciatica 06/18/2017   Lymphocytic colitis 06/18/2017   Single kidney 05/05/2017   Depression 05/05/2017   Eosinophilia 11/27/2016   H/O sleep apnea 11/27/2016   Normocalcemic Hyperparathyroidism (HCC) 11/21/2016   Osteoporosis 11/21/2016   Allergic rhinitis 03/17/2016   Multiple nasal polyps 03/17/2016   Chronic obstructive airway disease with asthma (HCC) 12/24/2015   Vitamin D deficiency 02/14/2014   Other abnormal glucose 02/14/2014   Encounter for long-term (current) use of other medications 12/13/2013   Hypertension    Hyperlipidemia    Cervical dysplasia    DUB (dysfunctional uterine bleeding)    Hypothyroidism  11/30/2007    Past Surgical History:  Procedure Laterality Date   ABDOMINAL HYSTERECTOMY  1989   TAH LSO   AUGMENTATION MAMMAPLASTY     Implants removed   COLPOSCOPY     COSMETIC SURGERY     EYE SURGERY  2017   KIDNEY REMOVED  2002   Left   NOSE SURGERY  2017   OOPHORECTOMY     LSO   TONSILLECTOMY      OB History     Gravida  1   Para  1   Term  1   Preterm      AB      Living  1      SAB      IAB      Ectopic      Multiple      Live Births               Home Medications    Prior to Admission medications   Medication Sig Start Date End Date Taking? Authorizing Provider  albuterol (VENTOLIN HFA) 108 (90 Base) MCG/ACT inhaler Inhale 1-2 puffs into the lungs every 6 (six) hours as needed. 02/27/23  Yes Mickie Bail, NP  ALPRAZolam Prudy Feeler) 0.5 MG tablet Take 1-2 tablets (0.5-1 mg total) by mouth at bedtime as needed for sleep. 02/23/18  Yes Jeffery, Chelle, PA  amLODipine (NORVASC) 5 MG tablet Take 0.5-1 tablets (2.5-5 mg total)  by mouth daily. 02/23/18  Yes Jeffery, Chelle, PA  azithromycin (ZITHROMAX) 250 MG tablet Take 1 tablet (250 mg total) by mouth daily. Take first 2 tablets together, then 1 every day until finished. 10/09/23  Yes Alexzandrea Normington R, NP  benzonatate (TESSALON) 100 MG capsule Take 1 capsule (100 mg total) by mouth every 8 (eight) hours. 10/04/23  Yes Valinda Hoar, NP  brimonidine (ALPHAGAN) 0.2 % ophthalmic solution  10/19/16  Yes [provider]  budesonide (PULMICORT) 0.5 MG/2ML nebulizer solution RINSE SINUSES WITH HALF OF SINUS RINSE BOTTLE THEN ADD ONE RESPULE AND MIX AND IRRIGATE SINUSES WITH REMAINING MIXTURE. PERFORM TWICE DAILY. 10/10/21  Yes [provider]  cetirizine (ZYRTEC) 10 MG tablet Take 10 mg by mouth as needed for allergies.   Yes [provider]  Cholecalciferol 100 MCG (4000 UT) CAPS Take 1 capsule by mouth daily.   Yes [provider]  co-enzyme Q-10 50 MG capsule Take 50 mg  by mouth daily.   Yes [provider]  fluticasone furoate-vilanterol (BREO ELLIPTA) 200-25 MCG/INH AEPB Inhale 1 puff into the lungs daily. 09/15/18  Yes Michele Mcalpine, MD  levothyroxine (SYNTHROID, LEVOTHROID) 100 MCG tablet TAKE 1 TABLET EVERY DAY 01/04/18  Yes Jeffery, Chelle, PA  losartan (COZAAR) 50 MG tablet Take 50 mg by mouth daily. 06/17/17  Yes [provider]  pantoprazole (PROTONIX) 40 MG tablet Take 40 mg by mouth daily.   Yes [provider]  Probiotic Product (PROBIOTIC PO) Take 1 tablet by mouth at bedtime.    Yes [provider]  RHOPRESSA 0.02 % SOLN  12/24/17  Yes [provider]  rosuvastatin (CRESTOR) 10 MG tablet Take 10 mg by mouth daily. 06/17/17  Yes [provider]  venlafaxine XR (EFFEXOR-XR) 150 MG 24 hr capsule Take 1 capsule (150 mg total) daily with breakfast by mouth. 08/28/17  Yes Jeffery, Chelle, PA  venlafaxine XR (EFFEXOR-XR) 75 MG 24 hr capsule TAKE ONE CAPSULE BY MOUTH ONCE DAILY WITH FOOD for 90   Yes [provider]  albuterol (ACCUNEB) 0.63 MG/3ML nebulizer solution albuterol sulfate 0.63 mg/3 mL solution for nebulization  INHALE BY INHALATION ROUTE DAILY    [provider]  fluticasone furoate-vilanterol (BREO ELLIPTA) 200-25 MCG/INH AEPB Inhale 1 puff into the lungs daily. 08/02/20   Martina Sinner, MD  LORazepam (ATIVAN) 1 MG tablet Take 1 tablet (1 mg total) by mouth every 8 (eight) hours as needed for anxiety. 06/26/23   Terrilee Files, MD  triamcinolone cream (KENALOG) 0.1 % APPLY TO AFFECTED AREA UP TO TWICE DAILY AS NEEDED FOR ECZEMA (NOT TO FACE, GROIN, UNDERARMS) 01/14/18   [provider]  Zoledronic Acid (RECLAST IV) Inject into the vein. Patient not taking: No sig reported    [provider]    Family History Family History  Problem Relation Age of Onset   Hypertension Mother    Heart disease Mother    Asthma Mother    Mental illness Mother         depression and anxiety   Lung cancer Father    Cancer Father        lung   Hypertension Sister    Cancer Sister        Stem cell cancer   Hypertension Brother    Stroke Brother    Cancer Brother        prostate cancer   Ovarian cancer Maternal Aunt    Breast cancer Maternal Aunt  Age 69's   Cancer Maternal Uncle        Prostate cancer   Breast cancer Maternal Aunt        Age 23's   Hypertension Daughter    Arthritis Daughter     Social History Social History   Tobacco Use   Smoking status: Former    Current packs/day: 0.00    Average packs/day: 5.0 packs/day for 10.0 years (50.0 ttl pk-yrs)    Types: Cigarettes    Start date: 08/22/1973    Quit date: 08/23/1983    Years since quitting: 40.1   Smokeless tobacco: Never  Vaping Use   Vaping status: Never Used  Substance Use Topics   Alcohol use: Yes    Alcohol/week: 21.0 standard drinks of alcohol    Types: 21 Standard drinks or equivalent per week    Comment: social   Drug use: No    Comment: cbd hemp     Allergies   Mirtazapine, Iodine, Iohexol, Penicillins, Sulfa antibiotics, and Neosporin [neomycin-polymyxin-gramicidin]   Review of Systems Review of Systems   Physical Exam Triage Vital Signs ED Triage Vitals  Encounter Vitals Group     BP 10/04/23 1208 (!) 149/81     Systolic BP Percentile --      Diastolic BP Percentile --      Pulse Rate 10/04/23 1208 73     Resp 10/04/23 1208 18     Temp 10/04/23 1208 98.4 F (36.9 C)     Temp Source 10/04/23 1208 Oral     SpO2 10/04/23 1208 96 %     Weight --      Height --      Head Circumference --      Peak Flow --      Pain Score 10/04/23 1211 0     Pain Loc --      Pain Education --      Exclude from Growth Chart --    No data found.  Updated Vital Signs BP (!) 149/81 (BP Location: Right Arm)   Pulse 73   Temp 98.4 F (36.9 C) (Oral)   Resp 18   SpO2 96%   Visual Acuity Right Eye Distance:   Left Eye Distance:   Bilateral  Distance:    Right Eye Near:   Left Eye Near:    Bilateral Near:     Physical Exam Constitutional:      Appearance: Normal appearance.  HENT:     Head: Normocephalic.     Right Ear: Tympanic membrane, ear canal and external ear normal.     Left Ear: Tympanic membrane, ear canal and external ear normal.     Nose: Congestion present. No rhinorrhea.     Mouth/Throat:     Mouth: Mucous membranes are moist.     Pharynx: Oropharynx is clear.  Eyes:     Extraocular Movements: Extraocular movements intact.  Cardiovascular:     Rate and Rhythm: Normal rate and regular rhythm.     Pulses: Normal pulses.     Heart sounds: Normal heart sounds.  Pulmonary:     Effort: Pulmonary effort is normal.     Breath sounds: Normal breath sounds.  Musculoskeletal:     Cervical back: Normal range of motion and neck supple.  Neurological:     Mental Status: She is alert and oriented to person, place, and time. Mental status is at baseline.      UC Treatments / Results  Labs (all labs ordered are  listed, but only abnormal results are displayed) Labs Reviewed - No data to display  EKG   Radiology No results found.  Procedures Procedures (including critical care time)  Medications Ordered in UC Medications - No data to display  Initial Impression / Assessment and Plan / UC Course  I have reviewed the triage vital signs and the nursing notes.  Pertinent labs & imaging results that were available during my care of the patient were reviewed by me and considered in my medical decision making (see chart for details).  COVID-19  Patient is in no signs of distress nor toxic appearing.  Vital signs are stable.  Low suspicion for pneumonia, pneumothorax or bronchitis and therefore will defer imaging.  Etiology viral, discussed timeline for resolution and progression of illness, prescribed Tessalon and watchful wait antibiotic placed at pharmacy if no improvement seen by the end of the week,  Augmentin prescribed.May use additional over-the-counter medications as needed for supportive care.  May follow-up with urgent care as needed if symptoms persist or worsen.  Final Clinical Impressions(s) / UC Diagnoses   Final diagnoses:  COVID-19     Discharge Instructions      Your symptoms are caused by COVID-19 patient is a virus and should steadily improve in time  You may use Tessalon pill every 8 hours as needed to help with coughing, continue use of Mucinex in addition  On exam lungs are clear, low suspicion that you have pneumonia however if your symptoms have not improved by Friday, December 27 you may pick up azithromycin from the pharmacy and begin use  You can take Tylenol and/or Ibuprofen as needed for fever reduction and pain relief.   For cough: honey 1/2 to 1 teaspoon (you can dilute the honey in water or another fluid).  You can also use guaifenesin and dextromethorphan for cough. You can use a humidifier for chest congestion and cough.  If you don't have a humidifier, you can sit in the bathroom with the hot shower running.      For sore throat: try warm salt water gargles, cepacol lozenges, throat spray, warm tea or water with lemon/honey, popsicles or ice, or OTC cold relief medicine for throat discomfort.   For congestion: take a daily anti-histamine like Zyrtec, Claritin, and a oral decongestant, such as pseudoephedrine.  You can also use Flonase 1-2 sprays in each nostril daily.   It is important to stay hydrated: drink plenty of fluids (water, gatorade/powerade/pedialyte, juices, or teas) to keep your throat moisturized and help further relieve irritation/discomfort.    ED Prescriptions     Medication Sig Dispense Auth. Provider   benzonatate (TESSALON) 100 MG capsule Take 1 capsule (100 mg total) by mouth every 8 (eight) hours. 30 capsule Cierria Height R, NP   azithromycin (ZITHROMAX) 250 MG tablet Take 1 tablet (250 mg total) by mouth daily. Take first 2  tablets together, then 1 every day until finished. 6 tablet Valinda Hoar, NP      PDMP not reviewed this encounter.   Valinda Hoar, NP 10/04/23 1319

## 2023-10-15 ENCOUNTER — Ambulatory Visit (HOSPITAL_BASED_OUTPATIENT_CLINIC_OR_DEPARTMENT_OTHER)
Admission: RE | Admit: 2023-10-15 | Discharge: 2023-10-15 | Disposition: A | Discharge status: Home / Self Care | Payer: Medicare Other | Source: Ambulatory Visit | Attending: Internal Medicine | Admitting: Internal Medicine

## 2023-10-15 DIAGNOSIS — Z780 Asymptomatic menopausal state: Secondary | ICD-10-CM | POA: Diagnosis not present

## 2023-10-15 DIAGNOSIS — M810 Age-related osteoporosis without current pathological fracture: Secondary | ICD-10-CM | POA: Insufficient documentation

## 2023-10-29 ENCOUNTER — Ambulatory Visit (HOSPITAL_COMMUNITY)
Admission: RE | Admit: 2023-10-29 | Discharge: 2023-10-29 | Disposition: A | Discharge status: Home / Self Care | Payer: Medicare Other | Source: Ambulatory Visit | Attending: Nurse Practitioner | Admitting: Nurse Practitioner

## 2023-10-29 ENCOUNTER — Other Ambulatory Visit (HOSPITAL_BASED_OUTPATIENT_CLINIC_OR_DEPARTMENT_OTHER): Payer: Self-pay | Admitting: Nurse Practitioner

## 2023-10-29 DIAGNOSIS — S0990XD Unspecified injury of head, subsequent encounter: Secondary | ICD-10-CM

## 2023-10-29 DIAGNOSIS — S0993XA Unspecified injury of face, initial encounter: Secondary | ICD-10-CM | POA: Diagnosis not present

## 2023-10-29 DIAGNOSIS — W19XXXD Unspecified fall, subsequent encounter: Secondary | ICD-10-CM | POA: Insufficient documentation

## 2023-10-29 DIAGNOSIS — R9089 Other abnormal findings on diagnostic imaging of central nervous system: Secondary | ICD-10-CM | POA: Diagnosis not present

## 2023-10-29 DIAGNOSIS — R519 Headache, unspecified: Secondary | ICD-10-CM

## 2023-10-29 DIAGNOSIS — S0990XA Unspecified injury of head, initial encounter: Secondary | ICD-10-CM | POA: Diagnosis not present

## 2023-10-29 DIAGNOSIS — I10 Essential (primary) hypertension: Secondary | ICD-10-CM | POA: Diagnosis not present

## 2023-12-09 DIAGNOSIS — Z961 Presence of intraocular lens: Secondary | ICD-10-CM | POA: Diagnosis not present

## 2023-12-09 DIAGNOSIS — H401131 Primary open-angle glaucoma, bilateral, mild stage: Secondary | ICD-10-CM | POA: Diagnosis not present

## 2023-12-09 DIAGNOSIS — H2511 Age-related nuclear cataract, right eye: Secondary | ICD-10-CM | POA: Diagnosis not present

## 2023-12-09 DIAGNOSIS — H1045 Other chronic allergic conjunctivitis: Secondary | ICD-10-CM | POA: Diagnosis not present

## 2023-12-09 DIAGNOSIS — H04123 Dry eye syndrome of bilateral lacrimal glands: Secondary | ICD-10-CM | POA: Diagnosis not present

## 2023-12-24 DIAGNOSIS — R1311 Dysphagia, oral phase: Secondary | ICD-10-CM | POA: Diagnosis not present

## 2023-12-24 DIAGNOSIS — R194 Change in bowel habit: Secondary | ICD-10-CM | POA: Diagnosis not present

## 2023-12-24 DIAGNOSIS — K2270 Barrett's esophagus without dysplasia: Secondary | ICD-10-CM | POA: Diagnosis not present

## 2023-12-24 DIAGNOSIS — Z860100 Personal history of colon polyps, unspecified: Secondary | ICD-10-CM | POA: Diagnosis not present

## 2024-01-14 DIAGNOSIS — Z09 Encounter for follow-up examination after completed treatment for conditions other than malignant neoplasm: Secondary | ICD-10-CM | POA: Diagnosis not present

## 2024-01-14 DIAGNOSIS — K2270 Barrett's esophagus without dysplasia: Secondary | ICD-10-CM | POA: Diagnosis not present

## 2024-01-14 DIAGNOSIS — K2281 Esophageal polyp: Secondary | ICD-10-CM | POA: Diagnosis not present

## 2024-01-14 DIAGNOSIS — K317 Polyp of stomach and duodenum: Secondary | ICD-10-CM | POA: Diagnosis not present

## 2024-01-14 DIAGNOSIS — K5289 Other specified noninfective gastroenteritis and colitis: Secondary | ICD-10-CM | POA: Diagnosis not present

## 2024-01-14 DIAGNOSIS — D120 Benign neoplasm of cecum: Secondary | ICD-10-CM | POA: Diagnosis not present

## 2024-01-14 DIAGNOSIS — D123 Benign neoplasm of transverse colon: Secondary | ICD-10-CM | POA: Diagnosis not present

## 2024-01-14 DIAGNOSIS — K2289 Other specified disease of esophagus: Secondary | ICD-10-CM | POA: Diagnosis not present

## 2024-01-14 DIAGNOSIS — K449 Diaphragmatic hernia without obstruction or gangrene: Secondary | ICD-10-CM | POA: Diagnosis not present

## 2024-01-14 DIAGNOSIS — R1310 Dysphagia, unspecified: Secondary | ICD-10-CM | POA: Diagnosis not present

## 2024-01-14 DIAGNOSIS — Z860100 Personal history of colon polyps, unspecified: Secondary | ICD-10-CM | POA: Diagnosis not present

## 2024-01-14 DIAGNOSIS — K2100 Gastro-esophageal reflux disease with esophagitis, without bleeding: Secondary | ICD-10-CM | POA: Diagnosis not present

## 2024-01-14 DIAGNOSIS — D122 Benign neoplasm of ascending colon: Secondary | ICD-10-CM | POA: Diagnosis not present

## 2024-01-19 DIAGNOSIS — D123 Benign neoplasm of transverse colon: Secondary | ICD-10-CM | POA: Diagnosis not present

## 2024-01-19 DIAGNOSIS — D122 Benign neoplasm of ascending colon: Secondary | ICD-10-CM | POA: Diagnosis not present

## 2024-01-19 DIAGNOSIS — K5289 Other specified noninfective gastroenteritis and colitis: Secondary | ICD-10-CM | POA: Diagnosis not present

## 2024-01-19 DIAGNOSIS — K2100 Gastro-esophageal reflux disease with esophagitis, without bleeding: Secondary | ICD-10-CM | POA: Diagnosis not present

## 2024-03-23 DIAGNOSIS — H1045 Other chronic allergic conjunctivitis: Secondary | ICD-10-CM | POA: Diagnosis not present

## 2024-03-23 DIAGNOSIS — Z961 Presence of intraocular lens: Secondary | ICD-10-CM | POA: Diagnosis not present

## 2024-03-23 DIAGNOSIS — H04123 Dry eye syndrome of bilateral lacrimal glands: Secondary | ICD-10-CM | POA: Diagnosis not present

## 2024-03-23 DIAGNOSIS — H2511 Age-related nuclear cataract, right eye: Secondary | ICD-10-CM | POA: Diagnosis not present

## 2024-03-23 DIAGNOSIS — H401131 Primary open-angle glaucoma, bilateral, mild stage: Secondary | ICD-10-CM | POA: Diagnosis not present

## 2024-03-24 DIAGNOSIS — Z08 Encounter for follow-up examination after completed treatment for malignant neoplasm: Secondary | ICD-10-CM | POA: Diagnosis not present

## 2024-03-24 DIAGNOSIS — D225 Melanocytic nevi of trunk: Secondary | ICD-10-CM | POA: Diagnosis not present

## 2024-03-24 DIAGNOSIS — Z1283 Encounter for screening for malignant neoplasm of skin: Secondary | ICD-10-CM | POA: Diagnosis not present

## 2024-03-24 DIAGNOSIS — Z85820 Personal history of malignant melanoma of skin: Secondary | ICD-10-CM | POA: Diagnosis not present

## 2024-05-13 DIAGNOSIS — M5416 Radiculopathy, lumbar region: Secondary | ICD-10-CM | POA: Diagnosis not present

## 2024-06-09 DIAGNOSIS — M5416 Radiculopathy, lumbar region: Secondary | ICD-10-CM | POA: Diagnosis not present

## 2024-06-22 DIAGNOSIS — D120 Benign neoplasm of cecum: Secondary | ICD-10-CM | POA: Diagnosis not present

## 2024-06-22 DIAGNOSIS — Z09 Encounter for follow-up examination after completed treatment for conditions other than malignant neoplasm: Secondary | ICD-10-CM | POA: Diagnosis not present

## 2024-06-22 DIAGNOSIS — Z860101 Personal history of adenomatous and serrated colon polyps: Secondary | ICD-10-CM | POA: Diagnosis not present

## 2024-06-22 DIAGNOSIS — D175 Benign lipomatous neoplasm of intra-abdominal organs: Secondary | ICD-10-CM | POA: Diagnosis not present

## 2024-06-22 DIAGNOSIS — K5730 Diverticulosis of large intestine without perforation or abscess without bleeding: Secondary | ICD-10-CM | POA: Diagnosis not present

## 2024-06-22 DIAGNOSIS — K649 Unspecified hemorrhoids: Secondary | ICD-10-CM | POA: Diagnosis not present

## 2024-06-24 DIAGNOSIS — D120 Benign neoplasm of cecum: Secondary | ICD-10-CM | POA: Diagnosis not present

## 2024-07-04 DIAGNOSIS — K080 Exfoliation of teeth due to systemic causes: Secondary | ICD-10-CM | POA: Diagnosis not present

## 2024-07-18 ENCOUNTER — Encounter: Payer: Self-pay | Admitting: Emergency Medicine

## 2024-07-18 ENCOUNTER — Ambulatory Visit
Admission: EM | Admit: 2024-07-18 | Discharge: 2024-07-18 | Disposition: A | Discharge status: Home / Self Care | Attending: Emergency Medicine | Admitting: Emergency Medicine

## 2024-07-18 DIAGNOSIS — R300 Dysuria: Secondary | ICD-10-CM | POA: Insufficient documentation

## 2024-07-18 LAB — POCT URINE DIPSTICK
Bilirubin, UA: NEGATIVE
Blood, UA: NEGATIVE
Glucose, UA: NEGATIVE mg/dL
Ketones, POC UA: NEGATIVE mg/dL
Leukocytes, UA: NEGATIVE
Nitrite, UA: NEGATIVE
POC PROTEIN,UA: NEGATIVE
Spec Grav, UA: 1.01 (ref 1.010–1.025)
Urobilinogen, UA: 0.2 U/dL
pH, UA: 6 (ref 5.0–8.0)

## 2024-07-18 MED ORDER — CIPROFLOXACIN HCL 500 MG PO TABS: 500.0000 mg | ORAL_TABLET | Freq: Two times a day (BID) | ORAL | 0 refills | Status: AC

## 2024-07-18 NOTE — ED Triage Notes (Signed)
 Patient reports urinary frequency and painful urination x 2 days. Patient took AZO Saturday with no relief.

## 2024-07-18 NOTE — Discharge Instructions (Addendum)
 Your urinalysis does not show signs of infection at this time, your urine will be sent to the lab to determine exactly which bacteria is present, if any changes need to be made to your medications you will be notified  Begin use of Cipro  twice daily for 5 days  You may use over-the-counter Azo to help minimize your symptoms until antibiotic removes bacteria, this medication will turn your urine orange  Increase your fluid intake through use of water  As always practice good hygiene, wiping front to back and avoidance of scented vaginal products to prevent further irritation  If symptoms continue to persist after use of medication or recur please follow-up with urgent care or your primary doctor as needed

## 2024-07-18 NOTE — ED Provider Notes (Signed)
 [facility]    CSN: [id number] Arrival date & time: 07/18/24  1340      History   Chief Complaint No chief complaint on file.   HPI [name] [name] is a 78 y.o. female.   Patient presents for evaluation of urinary frequency, lower abdominal pressure with urination and general malaise with nausea without vomiting present for 2 days.  History of reoccurring UTI.  Has not attempted treatment.  Has had increased life stressors, believed to be possible trigger.  Denies hematuria, flank pain or fever.  Denies vaginal symptoms.  History of a left nephrectomy.   Past Medical History:  Diagnosis Date   Allergy     Anxiety    Asthma    BCC (basal cell carcinoma), leg, left 08/2017   Cancer (HCC)    Kidney cancer-Hypernephroma   Cellulitis of nasal    Cervical dysplasia    COPD (chronic obstructive pulmonary disease) (HCC)    Depression    Elevated cholesterol    GERD (gastroesophageal reflux disease)    Glaucoma    Hypertension    Lymphocytic colitis    Osteoporosis 04/2018   2016 T score -3.2 2019 T score -2.8   SCC (squamous cell carcinoma), leg, right 08/2017   Serum calcium elevated     Patient Active Problem List   Diagnosis Date Noted   GAD (generalized anxiety disorder) 06/18/2017   Chronic left-sided low back pain with left-sided sciatica 06/18/2017   Lymphocytic colitis 06/18/2017   Single kidney 05/05/2017   Depression 05/05/2017   Eosinophilia 11/27/2016   H/O sleep apnea 11/27/2016   Normocalcemic Hyperparathyroidism (HCC) 11/21/2016   Osteoporosis 11/21/2016   Allergic rhinitis 03/17/2016   Multiple nasal polyps 03/17/2016   Chronic obstructive airway disease with asthma (HCC) 12/24/2015   Vitamin D  deficiency 02/14/2014   Other abnormal glucose 02/14/2014   Encounter for long-term (current) use of other medications 12/13/2013   Hypertension    Hyperlipidemia    Cervical dysplasia    DUB (dysfunctional uterine bleeding)    Hypothyroidism  11/30/2007    Past Surgical History:  Procedure Laterality Date   ABDOMINAL HYSTERECTOMY  1989   TAH LSO   AUGMENTATION MAMMAPLASTY     Implants removed   COLPOSCOPY     COSMETIC SURGERY     EYE SURGERY  2017   KIDNEY REMOVED  2002   Left   NOSE SURGERY  2017   OOPHORECTOMY     LSO   TONSILLECTOMY      OB History     Gravida  1   Para  1   Term  1   Preterm      AB      Living  1      SAB      IAB      Ectopic      Multiple      Live Births               Home Medications    Prior to Admission medications   Medication Sig Start Date End Date Taking? Authorizing Provider  ciprofloxacin  (CIPRO ) 500 MG tablet Take 1 tablet (500 mg total) by mouth 2 (two) times daily for 5 days. 07/18/24 07/23/24 Yes Cleaven Demario R, NP  albuterol  (ACCUNEB ) 0.63 MG/3ML nebulizer solution albuterol  sulfate 0.63 mg/3 mL solution for nebulization  INHALE BY INHALATION ROUTE DAILY    [provider]  albuterol  (VENTOLIN  HFA) 108 (90 Base) MCG/ACT inhaler Inhale 1-2 puffs into  the lungs every 6 (six) hours as needed. 02/27/23   Corlis Burnard DEL, NP  ALPRAZolam  (XANAX ) 0.5 MG tablet Take 1-2 tablets (0.5-1 mg total) by mouth at bedtime as needed for sleep. 02/23/18   Juliane Che, PA  amLODipine  (NORVASC ) 5 MG tablet Take 0.5-1 tablets (2.5-5 mg total) by mouth daily. 02/23/18   Juliane Che, PA  azithromycin  (ZITHROMAX ) 250 MG tablet Take 1 tablet (250 mg total) by mouth daily. Take first 2 tablets together, then 1 every day until finished. 10/09/23   Brayn Eckstein, Shelba SAUNDERS, NP  benzonatate  (TESSALON ) 100 MG capsule Take 1 capsule (100 mg total) by mouth every 8 (eight) hours. 10/04/23   Teresa Shelba SAUNDERS, NP  brimonidine (ALPHAGAN) 0.2 % ophthalmic solution  10/19/16   [provider]  budesonide  (PULMICORT ) 0.5 MG/2ML nebulizer solution RINSE SINUSES WITH HALF OF SINUS RINSE BOTTLE THEN ADD ONE RESPULE AND MIX AND IRRIGATE SINUSES WITH REMAINING MIXTURE. PERFORM  TWICE DAILY. 10/10/21   [provider]  cetirizine (ZYRTEC) 10 MG tablet Take 10 mg by mouth as needed for allergies.    [provider]  Cholecalciferol 100 MCG (4000 UT) CAPS Take 1 capsule by mouth daily.    [provider]  co-enzyme Q-10 50 MG capsule Take 50 mg by mouth daily.    [provider]  fluticasone  furoate-vilanterol (BREO ELLIPTA ) 200-25 MCG/INH AEPB Inhale 1 puff into the lungs daily. 09/15/18   Christi Glendia HERO, MD  fluticasone  furoate-vilanterol (BREO ELLIPTA ) 200-25 MCG/INH AEPB Inhale 1 puff into the lungs daily. 08/02/20   Kara Dorn NOVAK, MD  levothyroxine (SYNTHROID, LEVOTHROID) 100 MCG tablet TAKE 1 TABLET EVERY DAY 01/04/18   Juliane Che, PA  LORazepam  (ATIVAN ) 1 MG tablet Take 1 tablet (1 mg total) by mouth every 8 (eight) hours as needed for anxiety. 06/26/23   Towana Ozell BROCKS, MD  losartan (COZAAR) 50 MG tablet Take 50 mg by mouth daily. 06/17/17   [provider]  pantoprazole (PROTONIX) 40 MG tablet Take 40 mg by mouth daily.    [provider]  Probiotic Product (PROBIOTIC PO) Take 1 tablet by mouth at bedtime.     [provider]  RHOPRESSA 0.02 % SOLN  12/24/17   [provider]  rosuvastatin (CRESTOR) 10 MG tablet Take 10 mg by mouth daily. 06/17/17   [provider]  triamcinolone cream (KENALOG) 0.1 % APPLY TO AFFECTED AREA UP TO TWICE DAILY AS NEEDED FOR ECZEMA (NOT TO FACE, GROIN, UNDERARMS) 01/14/18   [provider]  venlafaxine  XR (EFFEXOR -XR) 150 MG 24 hr capsule Take 1 capsule (150 mg total) daily with breakfast by mouth. 08/28/17   Juliane Che, PA  venlafaxine  XR (EFFEXOR -XR) 75 MG 24 hr capsule TAKE ONE CAPSULE BY MOUTH ONCE DAILY WITH FOOD for 90    [provider]  Zoledronic  Acid (RECLAST  IV) Inject into the vein. Patient not taking: No sig reported    [provider]    Family History Family History  Problem Relation Age of Onset    Hypertension Mother    Heart disease Mother    Asthma Mother    Mental illness Mother        depression and anxiety   Lung cancer Father    Cancer Father        lung   Hypertension Sister    Cancer Sister        Stem cell cancer   Hypertension Brother    Stroke Brother  Cancer Brother        prostate cancer   Ovarian cancer Maternal Aunt    Breast cancer Maternal Aunt        Age 80's   Cancer Maternal Uncle        Prostate cancer   Breast cancer Maternal Aunt        Age 39's   Hypertension Daughter    Arthritis Daughter     Social History Social History   Tobacco Use   Smoking status: Former    Current packs/day: 0.00    Average packs/day: 5.0 packs/day for 10.0 years (50.0 ttl pk-yrs)    Types: Cigarettes    Start date: 08/22/1973    Quit date: 08/23/1983    Years since quitting: 40.9   Smokeless tobacco: Never  Vaping Use   Vaping status: Never Used  Substance Use Topics   Alcohol  use: Yes    Alcohol /week: 21.0 standard drinks of alcohol     Types: 21 Standard drinks or equivalent per week    Comment: social   Drug use: No    Comment: cbd hemp     Allergies   Mirtazapine, Iodine, Iohexol, Penicillins, Sulfa antibiotics, and Neosporin [neomycin-polymyxin-gramicidin]   Review of Systems Review of Systems   Physical Exam Triage Vital Signs ED Triage Vitals  Encounter Vitals Group     BP 07/18/24 1520 (!) 151/82     Girls Systolic BP Percentile --      Girls Diastolic BP Percentile --      Boys Systolic BP Percentile --      Boys Diastolic BP Percentile --      Pulse Rate 07/18/24 1520 96     Resp 07/18/24 1520 18     Temp 07/18/24 1520 98.3 F (36.8 C)     Temp Source 07/18/24 1520 Oral     SpO2 07/18/24 1520 98 %     Weight --      Height --      Head Circumference --      Peak Flow --      Pain Score 07/18/24 1523 0     Pain Loc --      Pain Education --      Exclude from Growth Chart --    No data found.  Updated Vital Signs BP  (!) 151/82 (BP Location: Left Arm)   Pulse 96   Temp 98.3 F (36.8 C) (Oral)   Resp 18   SpO2 98%   Visual Acuity Right Eye Distance:   Left Eye Distance:   Bilateral Distance:    Right Eye Near:   Left Eye Near:    Bilateral Near:     Physical Exam Constitutional:      Appearance: Normal appearance.  Eyes:     Extraocular Movements: Extraocular movements intact.  Pulmonary:     Effort: Pulmonary effort is normal.  Abdominal:     Tenderness: There is no right CVA tenderness, left CVA tenderness or guarding.  Neurological:     Mental Status: She is alert and oriented to person, place, and time.      UC Treatments / Results  Labs (all labs ordered are listed, but only abnormal results are displayed) Labs Reviewed  URINE CULTURE  POCT URINE DIPSTICK    EKG   Radiology No results found.  Procedures Procedures (including critical care time)  Medications Ordered in UC Medications - No data to display  Initial Impression / Assessment and Plan / UC Course  I have reviewed the triage vital signs and the nursing notes.  Pertinent labs & imaging results that were available during my care of the patient were reviewed by me and considered in my medical decision making (see chart for details).  Dysuria  Urinalysis negative, sent for culture, discussed findings with patient empirically placed on ciprofloxacin  as she is symptomatic, antibiotic chosen based on prior urine cultures with multiple species seen over time, recommended nonpharmacological measures and advised follow-up if symptoms persist Final Clinical Impressions(s) / UC Diagnoses   Final diagnoses:  Dysuria     Discharge Instructions      Your urinalysis does not show signs of infection at this time, your urine will be sent to the lab to determine exactly which bacteria is present, if any changes need to be made to your medications you will be notified  Begin use of Cipro  twice daily for 5  days  You may use over-the-counter Azo to help minimize your symptoms until antibiotic removes bacteria, this medication will turn your urine orange  Increase your fluid intake through use of water  As always practice good hygiene, wiping front to back and avoidance of scented vaginal products to prevent further irritation  If symptoms continue to persist after use of medication or recur please follow-up with urgent care or your primary doctor as needed    ED Prescriptions     Medication Sig Dispense Auth. Provider   ciprofloxacin  (CIPRO ) 500 MG tablet Take 1 tablet (500 mg total) by mouth 2 (two) times daily for 5 days. 10 tablet Isobella Ascher R, NP      PDMP not reviewed this encounter.   Teresa Shelba SAUNDERS, NP 07/18/24 [phone]

## 2024-07-19 DIAGNOSIS — K080 Exfoliation of teeth due to systemic causes: Secondary | ICD-10-CM | POA: Diagnosis not present

## 2024-07-20 LAB — URINE CULTURE: Culture: <10000 — AB

## 2024-07-21 ENCOUNTER — Ambulatory Visit (HOSPITAL_COMMUNITY): Payer: Self-pay

## 2024-08-01 DIAGNOSIS — H1045 Other chronic allergic conjunctivitis: Secondary | ICD-10-CM | POA: Diagnosis not present

## 2024-08-01 DIAGNOSIS — H2511 Age-related nuclear cataract, right eye: Secondary | ICD-10-CM | POA: Diagnosis not present

## 2024-08-01 DIAGNOSIS — Z961 Presence of intraocular lens: Secondary | ICD-10-CM | POA: Diagnosis not present

## 2024-08-01 DIAGNOSIS — H04123 Dry eye syndrome of bilateral lacrimal glands: Secondary | ICD-10-CM | POA: Diagnosis not present

## 2024-08-08 DIAGNOSIS — H1045 Other chronic allergic conjunctivitis: Secondary | ICD-10-CM | POA: Diagnosis not present

## 2024-08-08 DIAGNOSIS — H2511 Age-related nuclear cataract, right eye: Secondary | ICD-10-CM | POA: Diagnosis not present

## 2024-08-08 DIAGNOSIS — Z961 Presence of intraocular lens: Secondary | ICD-10-CM | POA: Diagnosis not present

## 2024-08-08 DIAGNOSIS — H04123 Dry eye syndrome of bilateral lacrimal glands: Secondary | ICD-10-CM | POA: Diagnosis not present

## 2024-08-09 DIAGNOSIS — M810 Age-related osteoporosis without current pathological fracture: Secondary | ICD-10-CM | POA: Diagnosis not present

## 2024-08-09 DIAGNOSIS — E038 Other specified hypothyroidism: Secondary | ICD-10-CM | POA: Diagnosis not present

## 2024-08-09 DIAGNOSIS — I10 Essential (primary) hypertension: Secondary | ICD-10-CM | POA: Diagnosis not present

## 2024-08-16 DIAGNOSIS — Z1339 Encounter for screening examination for other mental health and behavioral disorders: Secondary | ICD-10-CM | POA: Diagnosis not present

## 2024-08-16 DIAGNOSIS — I10 Essential (primary) hypertension: Secondary | ICD-10-CM | POA: Diagnosis not present

## 2024-08-16 DIAGNOSIS — Z0000 Encounter for general adult medical examination without abnormal findings: Secondary | ICD-10-CM | POA: Diagnosis not present

## 2024-08-18 ENCOUNTER — Ambulatory Visit: Admitting: Sleep Medicine

## 2024-08-18 ENCOUNTER — Encounter: Payer: Self-pay | Admitting: Sleep Medicine

## 2024-08-18 VITALS — BP 100/60 | HR 76 | Temp 98.2°F | Ht 66.0 in | Wt 174.8 lb

## 2024-08-18 DIAGNOSIS — J449 Chronic obstructive pulmonary disease, unspecified: Secondary | ICD-10-CM

## 2024-08-18 DIAGNOSIS — I10 Essential (primary) hypertension: Secondary | ICD-10-CM

## 2024-08-18 DIAGNOSIS — G4733 Obstructive sleep apnea (adult) (pediatric): Secondary | ICD-10-CM

## 2024-08-18 NOTE — Progress Notes (Signed)
 Name:[name] [name] MRN: [id number] DOB: Jun 22, 1946   CHIEF COMPLAINT:  EXCESSIVE DAYTIME SLEEPINESS   HISTORY OF PRESENT ILLNESS: [name] [name] is a 78 y.o. w/ a h/o COPD, asthma, hypothyroidism, HTN, anxiety and depression who presents for c/o loud snoring, witnessed apnea and excessive daytime sleepiness which has been present for several years. Reports nocturnal awakenings due to taking her dogs out, however does not have difficulty falling back to sleep. Denies any significant weight changes. Admits to night sweats and headaches. Denies RLS symptoms, dream enactment, cataplexy, hypnagogic or hypnapompic hallucinations. Denies a family history of sleep apnea. Denies drowsy driving. Drinks 1-2 cups of coffee daily, 1-2 glasses of wine nightly, former smoker, denies illicit drug use.   Bedtime 11 pm Sleep onset varies Rise time 10-11 am   EPWORTH SLEEP SCORE 2    08/18/2024    2:00 PM  Results of the Epworth flowsheet  Sitting and reading 0  Watching TV 0  Sitting, inactive in a public place (e.g. a theatre or a meeting) 0  As a passenger in a car for an hour without a break 0  Lying down to rest in the afternoon when circumstances permit 2  Sitting and talking to someone 0  Sitting quietly after a lunch without alcohol  0  In a car, while stopped for a few minutes in traffic 0  Total score 2    PAST MEDICAL HISTORY :   has a past medical history of Allergy , Anxiety, Asthma, BCC (basal cell carcinoma), leg, left (08/2017), Cancer (HCC), Cellulitis of nasal, Cervical dysplasia, COPD (chronic obstructive pulmonary disease) (HCC), Depression, Elevated cholesterol, GERD (gastroesophageal reflux disease), Glaucoma, Hypertension, Lymphocytic colitis, Osteoporosis (04/2018), SCC (squamous cell carcinoma), leg, right (08/2017), and Serum calcium elevated.  has a past surgical history that includes Abdominal hysterectomy (1989); Tonsillectomy; KIDNEY REMOVED (2002); Colposcopy;  Oophorectomy; Augmentation mammaplasty; Cosmetic surgery; Eye surgery (2017); and Nose surgery (2017). Prior to Admission medications   Medication Sig Start Date End Date Taking? Authorizing Provider  albuterol  (ACCUNEB ) 0.63 MG/3ML nebulizer solution albuterol  sulfate 0.63 mg/3 mL solution for nebulization  INHALE BY INHALATION ROUTE DAILY   Yes [provider]  albuterol  (VENTOLIN  HFA) 108 (90 Base) MCG/ACT inhaler Inhale 1-2 puffs into the lungs every 6 (six) hours as needed. 02/27/23  Yes Corlis Burnard DEL, NP  alprazolam  (XANAX ) 2 MG tablet Take 2 mg by mouth 2 (two) times daily as needed. 08/16/24  Yes [provider]  amLODipine  (NORVASC ) 5 MG tablet Take 0.5-1 tablets (2.5-5 mg total) by mouth daily. 02/23/18  Yes Jeffery, Bernita, PA  azithromycin  (ZITHROMAX ) 250 MG tablet Take 1 tablet (250 mg total) by mouth daily. Take first 2 tablets together, then 1 every day until finished. 10/09/23  Yes White, Adrienne R, NP  benzonatate  (TESSALON ) 100 MG capsule Take 1 capsule (100 mg total) by mouth every 8 (eight) hours. 10/04/23  Yes White, Adrienne R, NP  brimonidine (ALPHAGAN) 0.2 % ophthalmic solution  10/19/16  Yes [provider]  budesonide  (PULMICORT ) 0.5 MG/2ML nebulizer solution RINSE SINUSES WITH HALF OF SINUS RINSE BOTTLE THEN ADD ONE RESPULE AND MIX AND IRRIGATE SINUSES WITH REMAINING MIXTURE. PERFORM TWICE DAILY. 10/10/21  Yes [provider]  cetirizine (ZYRTEC) 10 MG tablet Take 10 mg by mouth as needed for allergies.   Yes [provider]  Cholecalciferol 100 MCG (4000 UT) CAPS Take 1 capsule by mouth daily.   Yes [provider]  co-enzyme Q-10 50 MG  capsule Take 50 mg by mouth daily.   Yes [provider]  dorzolamide-timolol (COSOPT) 2-0.5 % ophthalmic solution Place 1 drop into both eyes 2 (two) times daily. 04/20/24  Yes [provider]  fluticasone  furoate-vilanterol (BREO ELLIPTA ) 200-25 MCG/INH AEPB Inhale 1 puff  into the lungs daily. 09/15/18  Yes Christi Glendia HERO, MD  fluticasone  furoate-vilanterol (BREO ELLIPTA ) 200-25 MCG/INH AEPB Inhale 1 puff into the lungs daily. 08/02/20  Yes Kara Dorn NOVAK, MD  levothyroxine (SYNTHROID, LEVOTHROID) 100 MCG tablet TAKE 1 TABLET EVERY DAY 01/04/18  Yes Jeffery, Chelle, PA  LORazepam  (ATIVAN ) 1 MG tablet Take 1 tablet (1 mg total) by mouth every 8 (eight) hours as needed for anxiety. 06/26/23  Yes Towana Ozell BROCKS, MD  losartan (COZAAR) 50 MG tablet Take 50 mg by mouth daily. 06/17/17  Yes [provider]  ofloxacin (OCUFLOX) 0.3 % ophthalmic solution Place 2 drops into both eyes every 2 (two) hours. 08/08/24  Yes [provider]  pantoprazole (PROTONIX) 40 MG tablet Take 40 mg by mouth daily.   Yes [provider]  Probiotic Product (PROBIOTIC PO) Take 1 tablet by mouth at bedtime.    Yes [provider]  RHOPRESSA 0.02 % SOLN  12/24/17  Yes [provider]  rosuvastatin (CRESTOR) 10 MG tablet Take 10 mg by mouth daily. 06/17/17  Yes [provider]  triamcinolone cream (KENALOG) 0.1 % APPLY TO AFFECTED AREA UP TO TWICE DAILY AS NEEDED FOR ECZEMA (NOT TO FACE, GROIN, UNDERARMS) 01/14/18  Yes [provider]  venlafaxine  XR (EFFEXOR -XR) 150 MG 24 hr capsule Take 1 capsule (150 mg total) daily with breakfast by mouth. 08/28/17  Yes Jeffery, Chelle, PA  venlafaxine  XR (EFFEXOR -XR) 75 MG 24 hr capsule TAKE ONE CAPSULE BY MOUTH ONCE DAILY WITH FOOD for 90   Yes [provider]  Zoledronic  Acid (RECLAST  IV) Inject into the vein. Patient not taking: Reported on 08/18/2024    [provider]   Allergies  Allergen Reactions   Iodine Other (See Comments)    Pt has only one kidney.  Pt has only one kidney., Pt has only one kidney  Pt has only one kidney.  Pt has only one kidney   Iohexol Dermatitis    Code: RASH, Desc: PT STATES SHE HAS ONE KIDNEY SO NOT TO USE IV DYE/10/27/06/RM, Onset Date:  98847991   Code: RASH, Desc: PT STATES SHE HAS ONE KIDNEY SO NOT TO USE IV DYE/10/27/06/RM, Onset Date: [id number]   Mirtazapine Other (See Comments)    Dry mouth, felt off during the day.  Dry mouth, felt off during the day., Dry mouth, felt off during the day.  Dry mouth, felt off during the day.  Dry mouth, felt off during the day.   Penicillins Other (See Comments)    other   Sulfa Antibiotics Nausea Only   Sulfacetamide Other (See Comments)   Neosporin [Neomycin-Polymyxin-Gramicidin] Rash    FAMILY HISTORY:  family history includes Arthritis in her daughter; Asthma in her mother; Breast cancer in her maternal aunt and maternal aunt; Cancer in her brother, father, maternal uncle, and sister; Heart disease in her mother; Hypertension in her brother, daughter, mother, and sister; Lung cancer in her father; Mental illness in her mother; Ovarian cancer in her maternal aunt; Stroke in her brother. SOCIAL HISTORY:  reports that she quit smoking about 41 years ago. Her smoking use included cigarettes. She started smoking about 51 years ago. She has a 50 pack-year smoking history.  She has never used smokeless tobacco. She reports current alcohol  use of about 21.0 standard drinks of alcohol  per week. She reports that she does not use drugs.   Review of Systems:  Gen:  Denies  fever, sweats, chills weight loss  HEENT: Denies blurred vision, double vision, ear pain, eye pain, hearing loss, nose bleeds, sore throat Cardiac:  No dizziness, chest pain or heaviness, chest tightness,edema, No JVD Resp:   No cough, -sputum production, -shortness of breath,-wheezing, -hemoptysis,  Gi: Denies swallowing difficulty, stomach pain, nausea or vomiting, diarrhea, constipation, bowel incontinence Gu:  Denies bladder incontinence, burning urine Ext:   Denies Joint pain, stiffness or swelling Skin: Denies  skin rash, easy bruising or bleeding or hives Endoc:  Denies polyuria, polydipsia , polyphagia  or weight change Psych:   Denies depression, insomnia or hallucinations  Other:  All other systems negative  VITAL SIGNS: BP 100/60   Pulse 76   Temp 98.2 F (36.8 C)   Ht 5' 6 (1.676 m)   Wt 174 lb 12.8 oz (79.3 kg)   SpO2 97%   BMI 28.21 kg/m    Physical Examination:   General Appearance: No distress  EYES PERRLA, EOM intact.   NECK Supple, No JVD Pulmonary: normal breath sounds, No wheezing.  CardiovascularNormal S1,S2.  No m/r/g.   Abdomen: Benign, Soft, non-tender. Skin:   warm, no rashes, no ecchymosis  Extremities: normal, no cyanosis, clubbing. Neuro:without focal findings,  speech normal  PSYCHIATRIC: Mood, affect within normal limits.   ASSESSMENT AND PLAN  OSA I suspect that OSA is likely present due to clinical presentation. Discussed the consequences of untreated sleep apnea. Advised not to drive drowsy for safety of patient and others. Will complete further evaluation with a home sleep study and follow up to review results.    HTN Stable, on current management. Following with PCP.   COPD Stable on current management.    MEDICATION ADJUSTMENTS/LABS AND TESTS ORDERED: Recommend Sleep Study   Patient  satisfied with Plan of action and management. All questions answered  Follow up to review PSG results and treatment plan.   I spent a total of 45 minutes reviewing chart data, face-to-face evaluation with the patient, counseling and coordination of care as detailed above.    Tiffanny Lamarche, M.D.  Sleep Medicine [facility]

## 2024-08-18 NOTE — Patient Instructions (Signed)
 SABRA

## 2024-08-19 ENCOUNTER — Other Ambulatory Visit (HOSPITAL_COMMUNITY): Payer: Self-pay | Admitting: Internal Medicine

## 2024-08-19 ENCOUNTER — Telehealth (HOSPITAL_COMMUNITY): Payer: Self-pay | Admitting: Pharmacy Technician

## 2024-08-19 DIAGNOSIS — M810 Age-related osteoporosis without current pathological fracture: Secondary | ICD-10-CM | POA: Insufficient documentation

## 2024-08-19 NOTE — Telephone Encounter (Addendum)
 Auth Submission: NO AUTH NEEDED Site of care: [facility] Payer: [employer] MEDICARE Medication & CPT/J Code(s) submitted: Reclast  (Zolendronic acid) J3489 Diagnosis Code: M81.0 Route of submission (phone, fax, portal):  Phone # Fax # Auth type: Buy/Bill HB Units/visits requested: 5mg  x 1 dose, every 12 months  Reference number:  Approval from: 08/19/2024 to 10/12/24    Dagoberto Armour, CPhT [facility] Phone: [phone] 08/19/2024

## 2024-09-19 ENCOUNTER — Encounter (HOSPITAL_COMMUNITY): Payer: Self-pay | Admitting: Internal Medicine

## 2024-09-19 NOTE — Addendum Note (Signed)
 Addended by: DAYNE SHERRY RAMAN on: 09/19/2024 03:42 PM   Modules accepted: Orders

## 2024-10-18 ENCOUNTER — Telehealth (HOSPITAL_COMMUNITY): Payer: Self-pay | Admitting: Pharmacy Technician

## 2024-10-18 NOTE — Telephone Encounter (Signed)
 Auth Submission: APPROVED Site of care: [facility] Payer: [employer] MEDICARE Medication & CPT/J Code(s) submitted: Reclast  (Zolendronic acid) [id number] Diagnosis Code: M81.0 Route of submission (phone, fax, portal): PORTAL Phone # Fax # Auth type: Buy/Bill HB Units/visits requested: 5MG  X 1 DOSE, EVERY 12 MONTHS Reference number: [id number] Approval from: 10/18/2024 to 10/18/25        Dagoberto Armour, CPhT [facility] Phone: [phone] 10/18/2024

## 2024-10-18 NOTE — Telephone Encounter (Signed)
 [name] [name]

## 2024-10-19 ENCOUNTER — Encounter (HOSPITAL_COMMUNITY): Payer: Self-pay | Admitting: Internal Medicine

## 2024-10-21 ENCOUNTER — Ambulatory Visit: Attending: Sleep Medicine

## 2024-10-21 DIAGNOSIS — G4733 Obstructive sleep apnea (adult) (pediatric): Secondary | ICD-10-CM | POA: Insufficient documentation

## 2024-10-25 ENCOUNTER — Encounter (HOSPITAL_COMMUNITY)

## 2024-10-28 ENCOUNTER — Inpatient Hospital Stay (HOSPITAL_COMMUNITY): Admission: RE | Admit: 2024-10-28

## 2024-11-01 ENCOUNTER — Other Ambulatory Visit: Payer: Self-pay | Admitting: Internal Medicine

## 2024-11-01 DIAGNOSIS — Z1231 Encounter for screening mammogram for malignant neoplasm of breast: Secondary | ICD-10-CM

## 2024-11-02 ENCOUNTER — Encounter (HOSPITAL_COMMUNITY): Payer: Self-pay | Admitting: Internal Medicine

## 2024-11-02 ENCOUNTER — Ambulatory Visit
Admission: RE | Admit: 2024-11-02 | Discharge: 2024-11-02 | Disposition: A | Source: Ambulatory Visit | Attending: Internal Medicine | Admitting: Internal Medicine

## 2024-11-02 ENCOUNTER — Ambulatory Visit

## 2024-11-02 DIAGNOSIS — G4733 Obstructive sleep apnea (adult) (pediatric): Secondary | ICD-10-CM

## 2024-11-02 DIAGNOSIS — Z1231 Encounter for screening mammogram for malignant neoplasm of breast: Secondary | ICD-10-CM

## 2024-11-03 ENCOUNTER — Encounter (HOSPITAL_COMMUNITY): Payer: Self-pay | Admitting: Internal Medicine

## 2024-11-09 ENCOUNTER — Inpatient Hospital Stay (HOSPITAL_COMMUNITY): Admission: RE | Admit: 2024-11-09 | Source: Ambulatory Visit

## 2024-11-09 ENCOUNTER — Ambulatory Visit (INDEPENDENT_AMBULATORY_CARE_PROVIDER_SITE_OTHER): Payer: Self-pay | Admitting: Sleep Medicine

## 2024-11-10 NOTE — Progress Notes (Signed)
Letter generated for pt.

## 2024-11-15 NOTE — Telephone Encounter (Unsigned)
 Copied from CRM [id number]. Topic: Clinical - Lab/Test Results >> Nov 15, 2024  3:26 PM Joesph PARAS wrote: Reason for CRM: Patient calling about sleep results. Patient informed of results verbatim, states would like to consult with PCP before making any further decisions.

## 2024-11-15 NOTE — Telephone Encounter (Signed)
 Noted. Nothing further needed.
